# Patient Record
Sex: Female | Born: 1965 | Race: White | Hispanic: No | Marital: Married | Smoking: Never smoker
Health system: Southern US, Community
[De-identification: ages and names within clinical notes are randomized; demographics above are authoritative.]

## PROBLEM LIST (undated history)

## (undated) DIAGNOSIS — N159 Renal tubulo-interstitial disease, unspecified: Secondary | ICD-10-CM

## (undated) DIAGNOSIS — L719 Rosacea, unspecified: Secondary | ICD-10-CM

## (undated) DIAGNOSIS — R42 Dizziness and giddiness: Secondary | ICD-10-CM

## (undated) DIAGNOSIS — R51 Headache: Secondary | ICD-10-CM

## (undated) DIAGNOSIS — N390 Urinary tract infection, site not specified: Secondary | ICD-10-CM

## (undated) DIAGNOSIS — M722 Plantar fascial fibromatosis: Secondary | ICD-10-CM

## (undated) DIAGNOSIS — H9319 Tinnitus, unspecified ear: Secondary | ICD-10-CM

## (undated) DIAGNOSIS — G930 Cerebral cysts: Secondary | ICD-10-CM

## (undated) DIAGNOSIS — M419 Scoliosis, unspecified: Secondary | ICD-10-CM

## (undated) DIAGNOSIS — O24419 Gestational diabetes mellitus in pregnancy, unspecified control: Secondary | ICD-10-CM

## (undated) DIAGNOSIS — G4733 Obstructive sleep apnea (adult) (pediatric): Secondary | ICD-10-CM

## (undated) DIAGNOSIS — J309 Allergic rhinitis, unspecified: Secondary | ICD-10-CM

## (undated) DIAGNOSIS — J189 Pneumonia, unspecified organism: Secondary | ICD-10-CM

## (undated) DIAGNOSIS — Z9889 Other specified postprocedural states: Secondary | ICD-10-CM

## (undated) DIAGNOSIS — I959 Hypotension, unspecified: Secondary | ICD-10-CM

## (undated) DIAGNOSIS — M1712 Unilateral primary osteoarthritis, left knee: Secondary | ICD-10-CM

## (undated) DIAGNOSIS — R112 Nausea with vomiting, unspecified: Secondary | ICD-10-CM

## (undated) DIAGNOSIS — K4090 Unilateral inguinal hernia, without obstruction or gangrene, not specified as recurrent: Secondary | ICD-10-CM

## (undated) DIAGNOSIS — J342 Deviated nasal septum: Secondary | ICD-10-CM

## (undated) HISTORY — DX: Unilateral primary osteoarthritis, left knee: M17.12

## (undated) HISTORY — DX: Allergic rhinitis, unspecified: J30.9

## (undated) HISTORY — DX: Obstructive sleep apnea (adult) (pediatric): G47.33

## (undated) HISTORY — PX: TONSILLECTOMY AND ADENOIDECTOMY: SUR1326

## (undated) HISTORY — DX: Headache: R51

## (undated) HISTORY — PX: HERNIA REPAIR: SHX51

## (undated) HISTORY — PX: OTHER SURGICAL HISTORY: SHX169

## (undated) HISTORY — PX: TUBAL LIGATION: SHX77

## (undated) HISTORY — DX: Rosacea, unspecified: L71.9

---

## 2005-04-01 ENCOUNTER — Ambulatory Visit: Payer: Self-pay | Admitting: Family Medicine

## 2005-04-15 ENCOUNTER — Ambulatory Visit: Payer: Self-pay | Admitting: Family Medicine

## 2005-04-24 ENCOUNTER — Ambulatory Visit: Payer: Self-pay | Admitting: Family Medicine

## 2005-12-31 DIAGNOSIS — IMO0002 Reserved for concepts with insufficient information to code with codable children: Secondary | ICD-10-CM | POA: Insufficient documentation

## 2005-12-31 DIAGNOSIS — M4150 Other secondary scoliosis, site unspecified: Secondary | ICD-10-CM | POA: Insufficient documentation

## 2006-02-17 ENCOUNTER — Ambulatory Visit: Payer: Self-pay | Admitting: Family Medicine

## 2006-02-17 ENCOUNTER — Encounter: Payer: Self-pay | Admitting: Family Medicine

## 2006-02-17 ENCOUNTER — Other Ambulatory Visit: Admission: RE | Admit: 2006-02-17 | Discharge: 2006-02-17 | Payer: Self-pay | Admitting: Family Medicine

## 2006-02-17 LAB — CONVERTED CEMR LAB: Pap Smear: NEGATIVE

## 2006-02-19 ENCOUNTER — Encounter: Payer: Self-pay | Admitting: Family Medicine

## 2006-02-20 ENCOUNTER — Telehealth (INDEPENDENT_AMBULATORY_CARE_PROVIDER_SITE_OTHER): Payer: Self-pay | Admitting: *Deleted

## 2006-02-20 ENCOUNTER — Encounter: Payer: Self-pay | Admitting: Family Medicine

## 2006-02-24 LAB — CONVERTED CEMR LAB
ALT: 17 units/L (ref 0–35)
AST: 14 units/L (ref 0–37)
Albumin: 4.4 g/dL (ref 3.5–5.2)
Alkaline Phosphatase: 93 units/L (ref 39–117)
BUN: 10 mg/dL (ref 6–23)
CO2: 25 meq/L (ref 19–32)
Calcium: 9.3 mg/dL (ref 8.4–10.5)
Chloride: 106 meq/L (ref 96–112)
Cholesterol: 164 mg/dL (ref 0–200)
Creatinine, Ser: 0.64 mg/dL (ref 0.40–1.20)
Glucose, Bld: 99 mg/dL (ref 70–99)
HCT: 43.8 % — ABNORMAL HIGH (ref 34.4–43.3)
HDL: 36 mg/dL — ABNORMAL LOW (ref >39)
Hemoglobin: 14.5 g/dL (ref 11.7–14.8)
INR: 1 (ref 0.0–1.5)
LDL Cholesterol: 116 mg/dL — ABNORMAL HIGH (ref 0–99)
MCHC: 33.1 g/dL (ref 33.1–35.4)
MCV: 89.9 fL (ref 78.8–100.0)
Platelets: 271 10*3/uL (ref 152–374)
Potassium: 4.6 meq/L (ref 3.5–5.3)
Prothrombin Time: 13.6 s (ref 11.6–15.2)
RBC: 4.87 M/uL (ref 3.79–4.96)
RDW: 12.7 % (ref 11.5–15.3)
Sed Rate: 4 mm/hr (ref 0–22)
Sodium: 141 meq/L (ref 135–145)
TSH: 1.475 microintl units/mL (ref 0.350–5.50)
Total Bilirubin: 0.5 mg/dL (ref 0.3–1.2)
Total CHOL/HDL Ratio: 4.6
Total Protein: 7.4 g/dL (ref 6.0–8.3)
Triglycerides: 62 mg/dL (ref <150)
VLDL: 12 mg/dL (ref 0–40)
WBC: 5 10*3/uL (ref 3.7–10.0)

## 2006-05-21 ENCOUNTER — Ambulatory Visit: Payer: Self-pay | Admitting: Family Medicine

## 2006-05-21 DIAGNOSIS — M4310 Spondylolisthesis, site unspecified: Secondary | ICD-10-CM | POA: Insufficient documentation

## 2006-05-30 ENCOUNTER — Encounter: Payer: Self-pay | Admitting: Family Medicine

## 2006-06-04 ENCOUNTER — Ambulatory Visit: Payer: Self-pay | Admitting: Family Medicine

## 2006-07-03 ENCOUNTER — Ambulatory Visit: Payer: Self-pay | Admitting: Family Medicine

## 2006-12-12 ENCOUNTER — Ambulatory Visit: Payer: Self-pay | Admitting: Family Medicine

## 2008-03-14 ENCOUNTER — Ambulatory Visit: Payer: Self-pay | Admitting: Family Medicine

## 2008-08-03 ENCOUNTER — Ambulatory Visit: Payer: Self-pay | Admitting: Family Medicine

## 2008-08-03 DIAGNOSIS — J309 Allergic rhinitis, unspecified: Secondary | ICD-10-CM | POA: Insufficient documentation

## 2008-08-03 HISTORY — DX: Allergic rhinitis, unspecified: J30.9

## 2009-03-07 ENCOUNTER — Ambulatory Visit: Payer: Self-pay | Admitting: Family Medicine

## 2009-03-07 LAB — CONVERTED CEMR LAB
Glucose, Urine, Semiquant: NEGATIVE
Protein, U semiquant: 100
Specific Gravity, Urine: 1.02
Urobilinogen, UA: 0.2
pH: 7

## 2009-03-10 ENCOUNTER — Ambulatory Visit: Payer: Self-pay | Admitting: Family Medicine

## 2009-03-10 ENCOUNTER — Telehealth: Payer: Self-pay | Admitting: Family Medicine

## 2009-03-10 LAB — CONVERTED CEMR LAB
Glucose, Urine, Semiquant: NEGATIVE
Nitrite: POSITIVE
Protein, U semiquant: NEGATIVE
Specific Gravity, Urine: 1.01
Urobilinogen, UA: 0.2
pH: 6

## 2009-03-11 ENCOUNTER — Encounter: Payer: Self-pay | Admitting: Family Medicine

## 2009-04-25 ENCOUNTER — Encounter: Payer: Self-pay | Admitting: Family Medicine

## 2009-10-27 ENCOUNTER — Ambulatory Visit: Payer: Self-pay | Admitting: Family Medicine

## 2009-10-27 DIAGNOSIS — M75100 Unspecified rotator cuff tear or rupture of unspecified shoulder, not specified as traumatic: Secondary | ICD-10-CM | POA: Insufficient documentation

## 2009-10-27 DIAGNOSIS — IMO0002 Reserved for concepts with insufficient information to code with codable children: Secondary | ICD-10-CM | POA: Insufficient documentation

## 2009-11-13 ENCOUNTER — Ambulatory Visit: Payer: Self-pay | Admitting: Family Medicine

## 2009-11-13 ENCOUNTER — Encounter: Admission: RE | Admit: 2009-11-13 | Discharge: 2009-11-13 | Payer: Self-pay | Admitting: Family Medicine

## 2010-02-20 ENCOUNTER — Ambulatory Visit: Payer: Self-pay | Admitting: Family Medicine

## 2010-02-20 DIAGNOSIS — N390 Urinary tract infection, site not specified: Secondary | ICD-10-CM | POA: Insufficient documentation

## 2010-02-20 LAB — CONVERTED CEMR LAB
Nitrite: POSITIVE
Urobilinogen, UA: 0.2
pH: 7

## 2010-02-23 ENCOUNTER — Telehealth: Payer: Self-pay | Admitting: Family Medicine

## 2010-04-24 NOTE — Assessment & Plan Note (Signed)
Summary: UTI  Nurse Visit   Vital Signs:  Patient profile:   45 year old female Temp:     98.2 degrees F  Vitals Entered By: Payton Spark CMA (February 20, 2010 3:07 PM)  Primary Care Courtney Fenlon:  Nani Gasser MD  CC:  C/o burning w/ urination and urgency x 1 day.  History of Present Illness: C/o burning w/ urination and urgency x 1 day.     Impression & Recommendations:  Problem # 1:  UTI (ICD-599.0)  Orders: UA Dipstick w/o Micro (automated)  (81003)  Her updated medication list for this problem includes:    Bactrim Ds 800-160 Mg Tabs (Sulfamethoxazole-trimethoprim) .Marland Kitchen... Take 1 tablet by mouth two times a day for 3 days  Encouraged to push clear liquids, get enough rest, and take acetaminophen as needed. To be seen in 10 days if no improvement, sooner if worse.  Complete Medication List: 1)  Hydrocodone-acetaminophen 5-500 Mg Tabs (Hydrocodone-acetaminophen) .... One to two  by mouth at bedtime as needed 2)  Meloxicam 15 Mg Tabs (Meloxicam) .... Take 1 tablet by mouth once a day 3)  Tramadol Hcl 50 Mg Tabs (Tramadol hcl) .... Take 1 tablet by mouth three times a day as needed for pain. 4)  Bactrim Ds 800-160 Mg Tabs (Sulfamethoxazole-trimethoprim) .... Take 1 tablet by mouth two times a day for 3 days   Patient Instructions: 1)  push clear liquids, get enough rest, and take acetaminophen as needed. 2)  Complete the antibiotic. Call if not better in 5 days.     CC: C/o burning w/ urination and urgency x 1 day   Allergies: 1)  ! * Egg Whites Laboratory Results   Urine Tests    Routine Urinalysis   Color: yellow Appearance: Clear Glucose: negative   (Normal Range: Negative) Bilirubin: negative   (Normal Range: Negative) Ketone: trace (5)   (Normal Range: Negative) Spec. Gravity: 1.025   (Normal Range: 1.003-1.035) Blood: large   (Normal Range: Negative) pH: 7.0   (Normal Range: 5.0-8.0) Protein: 100   (Normal Range: Negative) Urobilinogen: 0.2    (Normal Range: 0-1) Nitrite: positive   (Normal Range: Negative) Leukocyte Esterace: moderate   (Normal Range: Negative)       Orders Added: 1)  UA Dipstick w/o Micro (automated)  [81003] 2)  Est. Patient Level I [27253] Prescriptions: BACTRIM DS 800-160 MG TABS (SULFAMETHOXAZOLE-TRIMETHOPRIM) Take 1 tablet by mouth two times a day for 3 days  #6 x 0   Entered and Authorized by:   Nani Gasser MD   Signed by:   Nani Gasser MD on 02/20/2010   Method used:   [facility]        [facility] [phone][facility] (retail)       [address] [place], [place]  [address]       Ph: [phone] or [phone]       Fax: [phone]   RxID:   [id number]

## 2010-04-24 NOTE — Assessment & Plan Note (Signed)
Summary: SHOULDER PAIN   Vital Signs:  Patient profile:   45 year old female Height:      66.25 inches Weight:      187 pounds Pulse rate:   68 / minute BP sitting:   117 / 81  (left arm) Cuff size:   regular  Vitals Entered By: Avon Gully CMA, (AAMA) (October 27, 2009 10:00 AM) CC: rt shoulder pain since 2 am Pain Assessment Patient in pain? yes     Location: rt shoulder Intensity: 10 Type: sharp   Primary Care Provider:  Nani Gasser MD  CC:  rt shoulder pain since 2 am.  History of Present Illness: rt shoulder pain since 2 am.  No alleviating sxs.  About 5 year ago had bursitis in her shoulder.  Pain is now radiating down to her outer shoulder.  Took some Advil for her toe but nothing today.  No worsening position. No trauma or heavy lifting or inciting factors.  No fever.    Current Medications (verified): 1)  None  Allergies (verified): 1)  ! * Egg Whites  Comments:  Nurse/Medical Assistant: The patient's medications and allergies were reviewed with the patient and were updated in the Medication and Allergy Lists. Avon Gully CMA, Duncan Dull) (October 27, 2009 10:03 AM)  Physical Exam  General:  Well-developed,well-nourished,in no acute distress; alert,appropriate and cooperative throughout examination Msk:  Right shoulder with NROM, exceptsome limitation with internal roation and crossover.  These are especially painful.Glori Bickers if 5/5. TEnder ofer the Tender below the acromium  and the upper deltoi.  Tender below the acromium anteriorly. Mild swelling present. NO erythema.    Impression & Recommendations:  Problem # 1:  SUBACROMIAL BURSITIS, RIGHT (ICD-726.19)  Dsicussed treament option. Increase Advil to 600mg  three times a day with food. Ecie the area. Can start exercises on Monday. H.O given.   Injection given and pt tolerated very well.  F/U inf not better in 2 weeks.   Orders: Joint Aspirate / Injection, Large (20610)  Complete  Medication List: 1)  Hydrocodone-acetaminophen 5-500 Mg Tabs (Hydrocodone-acetaminophen) .... One by mouth at bedtime as needed  Patient Instructions: 1)  Advil 3 tabs by mouth three times a day for inflammation and pain.  2)  Also given some hydrocodone for bedtime 3)  Ice the area for 20 min 3-4 x a day.  Can start the exercises on Monday. See the handout  Procedure Note  Injections: Duration of symptoms: 8-12 hrs. Indication: acute pain  Procedure # 1: joint injection    Region: posterior     Location: Right hsoulder.     Technique: 20 g needle    Medication: 40 mg depomedrol    Anesthesia: 1% lidocaine w/o epinephrine    Comment: lidocaine lot num [id number] exp 11/12 depo-medrol 40 mg/ml lot num [id number] exp 03/2010  Cleaned and prepped with: alcohol and betadine Wound dressing: bandaid Additional Instructions: Pt tolerated well. No complications.    Prescriptions: HYDROCODONE-ACETAMINOPHEN 5-500 MG TABS (HYDROCODONE-ACETAMINOPHEN) one by mouth at bedtime as needed  #30 x 0   Entered and Authorized by:   Nani Gasser MD   Signed by:   Nani Gasser MD on 10/27/2009   Method used:   Printed then faxed [facility] ...       [facility]  [address] [phone]* (retail)       [address] [place], [place]  [address]       Ph: [phone]  or [phone]       Fax: [phone]   RxID:   [id number]    Orders Added: 1)  Est. Patient Level III [01027] 2)  Joint Aspirate / Injection, Large [20610]

## 2010-04-24 NOTE — Assessment & Plan Note (Signed)
Summary: f/u on shoulder pain   Vital Signs:  Patient profile:   45 year old female Height:      66.25 inches Weight:      189 pounds BMI:     30.39 O2 Sat:      98 % on Room air Pulse rate:   99 / minute BP sitting:   127 / 74  (left arm) Cuff size:   large  Vitals Entered By: Payton Spark CMA (November 13, 2009 3:21 PM)  O2 Flow:  Room air CC: F/U shoulder pain. Is getting better but still having pain mostly at night.   Primary Care Provider:  Nani Gasser MD  CC:  F/U shoulder pain. Is getting better but still having pain mostly at night..  History of Present Illness: F/U shoulder pain. Is getting better but still having pain mostly at night. almost 40% better. Now has just 2 spots that throb. One is over the outer deltoid and on the uper shouder.  Still can't get relief at night.  She hs been weaning down on her IBU. Does have good ROM now.  Not sure if the injection helpd or night.  Still icing occ.   Current Medications (verified): 1)  Hydrocodone-Acetaminophen 5-500 Mg Tabs (Hydrocodone-Acetaminophen) .... One By Mouth At Bedtime As Needed  Allergies (verified): 1)  ! * Egg Whites  Physical Exam  General:  Well-developed,well-nourished,in no acute distress; alert,appropriate and cooperative throughout examination Msk:  Right shoulder with NROM. Tender over teh outer deltoid and over the upper trapezius. No jont tenderness. Strenght 5/5. Got a cramp in the deltoid during exam.    Impression & Recommendations:  Problem # 1:  SUBACROMIAL BURSITIS, RIGHT (ICD-726.19) Her exam today is less consistant with bursitis.  Stop the Ibuprofen and can start the meloxicam once a day and use the tramadol up to 3 x a day as a rescue medication. Will get an xray, though will likely be negative.  Will call with results.  Contnue the exercises for now since getting some better (about 40%).  will get an xray today. If neg then refer to ortho for further managment. Will change to  longet acting antiinflammotory and tramadol for acute pain relief.   Orders: T-DG Shoulder*R* (54098)  Complete Medication List: 1)  Hydrocodone-acetaminophen 5-500 Mg Tabs (Hydrocodone-acetaminophen) .... One to two  by mouth at bedtime as needed 2)  Meloxicam 15 Mg Tabs (Meloxicam) .... Take 1 tablet by mouth once a day 3)  Tramadol Hcl 50 Mg Tabs (Tramadol hcl) .... Take 1 tablet by mouth three times a day as needed for pain.  Patient Instructions: 1)  We will call you with your xray results 2)  Stop the Ibuprofen and can start the meloxicam once a day and use the tramadol up to 3 x a day as a rescue medication.  Prescriptions: HYDROCODONE-ACETAMINOPHEN 5-500 MG TABS (HYDROCODONE-ACETAMINOPHEN) one to two  by mouth at bedtime as needed  #60 x 0   Entered and Authorized by:   Nani Gasser MD   Signed by:   Nani Gasser MD on 11/13/2009   Method used:   Printed then faxed to ...       [facility]  [place] [phone]* (retail)       [address] [place], [place]  [address]       Ph: [phone] or [phone]       Fax: [phone]   RxID:   [id number] TRAMADOL HCL  50 MG TABS (TRAMADOL HCL) Take 1 tablet by mouth three times a day as needed for pain.  #45 x 0   Entered and Authorized by:   Nani Gasser MD   Signed by:   Nani Gasser MD on 11/13/2009   Method used:   Electronically to        [facility] [phone]* (retail)       [address] [place], [place]  [address]       Ph: [phone] or [phone]       Fax: [phone]   RxID:   [id number] MELOXICAM 15 MG TABS (MELOXICAM) Take 1 tablet by mouth once a day  #30 x 1   Entered and Authorized by:   Nani Gasser MD   Signed by:   Nani Gasser MD on 11/13/2009   Method used:   Electronically to        [facility] [phone]* (retail)       [address] [place], [place]  [address]       Ph: [phone] or [phone]       Fax: [phone]   RxID:   [id number]

## 2010-04-24 NOTE — Letter (Signed)
Summary: Gae Dry MD  Gae Dry MD   Imported By: Lanelle Bal 06/22/2009 10:36:02  _____________________________________________________________________  External Attachment:    Type:   Image     Comment:   External Document

## 2010-04-24 NOTE — Progress Notes (Signed)
Summary: Rash from antibiotic  Phone Note Call from Patient Call back at Work Phone [phone]   Caller: Patient Call For: Nani Gasser MD Summary of Call: Pt calls and started the Bactrim on Tuesday night and noticed red welp rash that really itches on both hands and back itching. Has one more pill left and didn't know if needed a different med or continue to take Benedryl which help some but not alot. Initial call taken by: Kathlene November LPN,  February 23, 2010 9:53 AM  Follow-up for Phone Call        If only has one left then stop it. I will add to her allergy list. Yes, take 2 benadryl and repeat in 6 hours if needed.  Follow-up by: Nani Gasser MD,  February 23, 2010 11:35 AM  Additional Follow-up for Phone Call Additional follow up Details #1::        Pt notified of MD instructions. [id number] Additional Follow-up by: Kathlene November LPN,  February 23, 2010 11:37 AM   New Allergies: ! BACTRIM (SULFAMETHOXAZOLE-TRIMETHOPRIM) New Allergies: ! BACTRIM (SULFAMETHOXAZOLE-TRIMETHOPRIM)

## 2010-09-12 ENCOUNTER — Encounter: Payer: Self-pay | Admitting: Family Medicine

## 2010-09-13 ENCOUNTER — Other Ambulatory Visit (HOSPITAL_COMMUNITY)
Admission: RE | Admit: 2010-09-13 | Discharge: 2010-09-13 | Disposition: A | Discharge status: Home / Self Care | Payer: BC Managed Care – PPO | Source: Ambulatory Visit | Attending: Family Medicine | Admitting: Family Medicine

## 2010-09-13 ENCOUNTER — Encounter: Payer: Self-pay | Admitting: Family Medicine

## 2010-09-13 ENCOUNTER — Ambulatory Visit (INDEPENDENT_AMBULATORY_CARE_PROVIDER_SITE_OTHER): Payer: BC Managed Care – PPO | Admitting: Family Medicine

## 2010-09-13 DIAGNOSIS — R0683 Snoring: Secondary | ICD-10-CM

## 2010-09-13 DIAGNOSIS — L719 Rosacea, unspecified: Secondary | ICD-10-CM

## 2010-09-13 DIAGNOSIS — Z124 Encounter for screening for malignant neoplasm of cervix: Secondary | ICD-10-CM

## 2010-09-13 DIAGNOSIS — Z0000 Encounter for general adult medical examination without abnormal findings: Secondary | ICD-10-CM

## 2010-09-13 DIAGNOSIS — R0609 Other forms of dyspnea: Secondary | ICD-10-CM

## 2010-09-13 DIAGNOSIS — Z1159 Encounter for screening for other viral diseases: Secondary | ICD-10-CM | POA: Insufficient documentation

## 2010-09-13 DIAGNOSIS — R0989 Other specified symptoms and signs involving the circulatory and respiratory systems: Secondary | ICD-10-CM

## 2010-09-13 DIAGNOSIS — Z01419 Encounter for gynecological examination (general) (routine) without abnormal findings: Secondary | ICD-10-CM | POA: Insufficient documentation

## 2010-09-13 HISTORY — DX: Rosacea, unspecified: L71.9

## 2010-09-13 MED ORDER — FLUTICASONE FUROATE 27.5 MCG/SPRAY NA SUSP: 2.0000 | Freq: Every day | NASAL | Status: DC

## 2010-09-13 NOTE — Progress Notes (Signed)
  Subjective:     Briana Parks is a 45 y.o. female and is here for a comprehensive physical exam. The patient reports problems - husband says she is snoring and thinks she gasps in her sleep somtimes.  Snoring is most nights. Oc wakes up with a HA in the AM.  Fatigued during the day. Has never been tested for sleep apnea.  No recent weight changes.  Always feels like she has nasal congestion.    History   Social History  . Marital Status: Married    Spouse Name: N/A    Number of Children: N/A  . Years of Education: N/A   Occupational History  . Not on file.   Social History Main Topics  . Smoking status: Never Smoker   . Smokeless tobacco: Not on file  . Alcohol Use: Yes     rare  . Drug Use: No  . Sexually Active:      cake decorator, 2 yrs college, married, 2 children, 4 sodas daily, no regular exercise   Other Topics Concern  . Not on file   Social History Narrative  . No narrative on file   Health Maintenance  Topic Date Due  . Pap Smear  05/18/1985  . Tetanus/tdap  05/18/1986    The following portions of the patient's history were reviewed and updated as appropriate: allergies, current medications, past family history, past medical history, past social history and past surgical history.  Review of Systems A comprehensive review of systems was negative.   Objective:    BP 127/81  Pulse 85  Ht 5\' 6"  (1.676 m)  Wt 189 lb (85.73 kg)  BMI 30.51 kg/m2 General appearance: alert, cooperative and appears stated age. Overwight.  Head: Normocephalic, without obvious abnormality, atraumatic Eyes: conjunctiva clear, PEERLA, EOMi Ears: normal TM's and external ear canals both ears Nose: Nares normal. Septum midline. Mucosa normal. No drainage or sinus tenderness.Turbinates are only mildy swollen.  Throat: lips, mucosa, and tongue normal; teeth and gums normal Neck: no adenopathy, no carotid bruit, supple, symmetrical, trachea midline and thyroid not enlarged,  symmetric, no tenderness/mass/nodules Back: symmetric, no curvature. ROM normal. No CVA tenderness. Lungs: clear to auscultation bilaterally Breasts: normal appearance, no masses or tenderness Heart: regular rate and rhythm, S1, S2 normal, no murmur, click, rub or gallop Abdomen: soft, non-tender; bowel sounds normal; no masses,  no organomegaly Pelvic: cervix normal in appearance, external genitalia normal, no adnexal masses or tenderness, no cervical motion tenderness, rectovaginal septum normal, uterus normal size, shape, and consistency and vagina normal without discharge Extremities: extremities normal, atraumatic, no cyanosis or edema Pulses: 2+ and symmetric Skin: Skin color, texture, turgor normal. No rashes or lesions. Rosacea on her facial cheeks.  Lymph nodes: Cervical, supraclavicular, and axillary nodes normal. Neurologic: Grossly normal    Assessment:    Healthy female exam.    Plan:     See After Visit Summary for Counseling Recommendations  1. Encourage healthy diet and exercise.  2. Vaccines up to date. Allergic to tetanus.  3. Screening labs 4. Weill schedule her for a sleep study test. since she has snoring, am HA, and daytime fatigue.  5. Will call with pap results.  6. Also will try a nasal steroid for 2 weeks for her chronic nasal congesiton and see if hthat helps her sxs.

## 2010-09-15 LAB — COMPLETE METABOLIC PANEL WITH GFR
Albumin: 4.4 g/dL (ref 3.5–5.2)
GFR, Est African American: >60 mL/min (ref >60)
GFR, Est Non African American: >60 mL/min (ref >60)
Glucose, Bld: 98 mg/dL (ref 70–99)
Sodium: 137 mEq/L (ref 135–145)
Total Protein: 6.8 g/dL (ref 6.0–8.3)

## 2010-09-16 ENCOUNTER — Telehealth: Payer: Self-pay | Admitting: Family Medicine

## 2010-09-16 NOTE — Telephone Encounter (Signed)
Call pt: CMP is nll.  Bad chol is up more than last year,  and good chol is low. REcommend regular exercise and how fat diet.

## 2010-09-17 NOTE — Telephone Encounter (Signed)
Advised pt of results and rec. 

## 2010-09-21 ENCOUNTER — Telehealth: Payer: Self-pay | Admitting: Family Medicine

## 2010-09-21 NOTE — Telephone Encounter (Signed)
Pls let pt know that her pap smear came back normal.  Repeat in 1-2 yrs.   

## 2010-09-21 NOTE — Telephone Encounter (Signed)
LMOM informing Pt  

## 2010-10-10 ENCOUNTER — Telehealth: Payer: Self-pay | Admitting: Family Medicine

## 2010-10-10 NOTE — Telephone Encounter (Signed)
Call pt: sleep study did show she has some sleep apnea. Recommend not driving when feels sleepy. i would like her to make an appt to discuss results in detail.

## 2010-10-10 NOTE — Telephone Encounter (Signed)
Pt notified of her recent sleep study results.  Told pt not to drive when feels sleepy, and a appt was made for pt for 10-24-10 with the provider to discuss in more detail. Jarvis Newcomer, LPN Domingo Dimes

## 2010-10-11 ENCOUNTER — Telehealth: Payer: Self-pay | Admitting: Family Medicine

## 2010-10-11 NOTE — Telephone Encounter (Signed)
Closed

## 2010-10-24 ENCOUNTER — Ambulatory Visit (INDEPENDENT_AMBULATORY_CARE_PROVIDER_SITE_OTHER): Payer: BC Managed Care – PPO | Admitting: Family Medicine

## 2010-10-24 ENCOUNTER — Encounter: Payer: Self-pay | Admitting: Family Medicine

## 2010-10-24 DIAGNOSIS — G4733 Obstructive sleep apnea (adult) (pediatric): Secondary | ICD-10-CM

## 2010-10-24 HISTORY — DX: Obstructive sleep apnea (adult) (pediatric): G47.33

## 2010-10-24 NOTE — Progress Notes (Signed)
  Subjective:    Patient ID: Briana Parks, female    DOB: 1965/09/18, 45 y.o.   MRN: [id number]  HPI She is here today to discuss the results of her sleep study that she had done at [facility]. She was diagnosed with moderate overall obstructive sleep apnea. AHI of 19.8. With desaturations to 79%. Severe obstructive sleep apnea and REM sleep. She had frequent severe snoring and moderate leg charts that were associated with respiratory events. She had difficulty initiating and maintaining sleep. No slow wave sleep. She does have mild obesity which certainly is a risk factor. She also had moderate daytime sleepiness based on her Epworth sleepiness scale of 11. She was unable to do the second part of the sleep study because of time constraints. I discussed these results with her and her husband today. I did explain to her that it's not safe for her to drive if she becomes sleepy. Also that she should not operate heavy machinery. vo  Review of Systems     Objective:   Physical Exam        Assessment & Plan:  Moderate OSA. I discussed these results with her and her husband today. I did explain to her that it's not safe for her to drive if she becomes sleepy. Also that she should not operate heavy machinery. We will schedule her for the second part of split sleep study. I did discuss with her that we certainly could consult Dr. Pearlie Oyster as well if she is interested. At this time she would like to go ahead and do the second part of the study and get set up with CPAP. I explained how this works. Then we will followup in 6-8 weeks after she starts using her CPAP to see how well she is doing. If we run into any barriers at that time then we will consider referral to Dr. Pearlie Oyster for further evaluation and treatment.

## 2010-10-30 ENCOUNTER — Encounter: Payer: Self-pay | Admitting: Family Medicine

## 2010-11-12 ENCOUNTER — Telehealth: Payer: Self-pay | Admitting: Family Medicine

## 2010-11-12 DIAGNOSIS — G4733 Obstructive sleep apnea (adult) (pediatric): Secondary | ICD-10-CM

## 2010-11-12 MED ORDER — AMBULATORY NON FORMULARY MEDICATION: Status: AC

## 2010-11-12 NOTE — Telephone Encounter (Signed)
Call pt: 2nd part of sleep study showed need 7 cm water pressure on the CPAP.  I will put in rx for home health. Lets use [facility] or [facility].

## 2010-11-13 NOTE — Telephone Encounter (Signed)
Left message on vm

## 2011-01-02 ENCOUNTER — Telehealth: Payer: Self-pay | Admitting: Family Medicine

## 2011-01-02 NOTE — Telephone Encounter (Signed)
Victorino Dike with [facility] called to verify the order that was sent to them regarding this patient. Plan:  Order verified. Jarvis Newcomer, LPN Domingo Dimes

## 2011-01-10 ENCOUNTER — Encounter: Payer: Self-pay | Admitting: Family Medicine

## 2011-02-12 ENCOUNTER — Encounter: Payer: Self-pay | Admitting: Family Medicine

## 2011-09-20 ENCOUNTER — Encounter: Payer: Self-pay | Admitting: *Deleted

## 2011-09-24 ENCOUNTER — Encounter: Payer: Self-pay | Admitting: Family Medicine

## 2011-09-24 ENCOUNTER — Ambulatory Visit (INDEPENDENT_AMBULATORY_CARE_PROVIDER_SITE_OTHER): Payer: BC Managed Care – PPO | Admitting: Family Medicine

## 2011-09-24 ENCOUNTER — Ambulatory Visit (INDEPENDENT_AMBULATORY_CARE_PROVIDER_SITE_OTHER): Payer: BC Managed Care – PPO

## 2011-09-24 VITALS — BP 132/84 | HR 67 | Ht 66.0 in | Wt 193.0 lb

## 2011-09-24 DIAGNOSIS — M79673 Pain in unspecified foot: Secondary | ICD-10-CM

## 2011-09-24 DIAGNOSIS — N920 Excessive and frequent menstruation with regular cycle: Secondary | ICD-10-CM

## 2011-09-24 DIAGNOSIS — M25519 Pain in unspecified shoulder: Secondary | ICD-10-CM

## 2011-09-24 DIAGNOSIS — M2550 Pain in unspecified joint: Secondary | ICD-10-CM

## 2011-09-24 DIAGNOSIS — N926 Irregular menstruation, unspecified: Secondary | ICD-10-CM

## 2011-09-24 DIAGNOSIS — M79609 Pain in unspecified limb: Secondary | ICD-10-CM

## 2011-09-24 DIAGNOSIS — M722 Plantar fascial fibromatosis: Secondary | ICD-10-CM

## 2011-09-24 MED ORDER — MELOXICAM 15 MG PO TABS: 15.0000 mg | ORAL_TABLET | Freq: Every day | ORAL | Status: DC | PRN

## 2011-09-24 MED ORDER — METAXALONE 800 MG PO TABS: 800.0000 mg | ORAL_TABLET | Freq: Every evening | ORAL | Status: DC | PRN

## 2011-09-24 NOTE — Patient Instructions (Addendum)
Plantar Fasciitis Plantar fasciitis is a common condition that causes foot pain. It is soreness (inflammation) of the band of tough fibrous tissue on the bottom of the foot that runs from the heel bone (calcaneus) to the ball of the foot. The cause of this soreness may be from excessive standing, poor fitting shoes, running on hard surfaces, being overweight, having an abnormal walk, or overuse (this is common in runners) of the painful foot or feet. It is also common in aerobic exercise dancers and ballet dancers. SYMPTOMS   Most people with plantar fasciitis complain of:  Severe pain in the morning on the bottom of their foot especially when taking the first steps out of bed. This pain recedes after a few minutes of walking.   Severe pain is experienced also during walking following a long period of inactivity.   Pain is worse when walking barefoot or up stairs  DIAGNOSIS    Your caregiver will diagnose this condition by examining and feeling your foot.   Special tests such as X-rays of your foot, are usually not needed.  PREVENTION    Consult a sports medicine professional before beginning a new exercise program.   Walking programs offer a good workout. With walking there is a lower chance of overuse injuries common to runners. There is less impact and less jarring of the joints.   Begin all new exercise programs slowly. If problems or pain develop, decrease the amount of time or distance until you are at a comfortable level.   Wear good shoes and replace them regularly.   Stretch your foot and the heel cords at the back of the ankle (Achilles tendon) both before and after exercise.   Run or exercise on even surfaces that are not hard. For example, asphalt is better than pavement.   Do not run barefoot on hard surfaces.   If using a treadmill, vary the incline.   Do not continue to workout if you have foot or joint problems. Seek professional help if they do not improve.  HOME  CARE INSTRUCTIONS    Avoid activities that cause you pain until you recover.   Use ice or cold packs on the problem or painful areas after working out.   Only take over-the-counter or prescription medicines for pain, discomfort, or fever as directed by your caregiver.   Soft shoe inserts or athletic shoes with air or gel sole cushions may be helpful.   If problems continue or become more severe, consult a sports medicine caregiver or your own health care provider. Cortisone is a potent anti-inflammatory medication that may be injected into the painful area. You can discuss this treatment with your caregiver.  MAKE SURE YOU:    Understand these instructions.   Will watch your condition.   Will get help right away if you are not doing well or get worse.  Document Released: 12/04/2000 Document Revised: 02/28/2011 Document Reviewed: 02/03/2008 [facility] Patient Information 2012 [place], [place].Bursitis Bursitis is a swelling and soreness (inflammation) of a fluid-filled sac (bursa) that overlies and protects a joint. It can be caused by injury, overuse of the joint, arthritis or infection. The joints most likely to be affected are the elbows, shoulders, hips and knees. HOME CARE INSTRUCTIONS    Apply ice to the affected area for 15 to 20 minutes each hour while awake for 2 days. Put the ice in a plastic bag and place a towel between the bag of ice and your skin.   Rest the  injured joint as much as possible, but continue to put the joint through a full range of motion, 4 times per day. (The shoulder joint especially becomes rapidly "frozen" if not used.) When the pain lessens, begin normal slow movements and usual activities.   Only take over-the-counter or prescription medicines for pain, discomfort or fever as directed by your caregiver.   Your caregiver may recommend draining the bursa and injecting medicine into the bursa. This may help the healing process.   Follow all instructions for  follow-up with your caregiver. This includes any orthopedic referrals, physical therapy and rehabilitation. Any delay in obtaining necessary care could result in a delay or failure of the bursitis to heal and chronic pain.  SEEK IMMEDIATE MEDICAL CARE IF:    Your pain increases even during treatment.   You develop an oral temperature above 102 F (38.9 C) and have heat and inflammation over the involved bursa.  MAKE SURE YOU:    Understand these instructions.   Will watch your condition.   Will get help right away if you are not doing well or get worse.  Document Released: 03/08/2000 Document Revised: 02/28/2011 Document Reviewed: 02/10/2009 [facility] Patient Information 2012 [place], [place].

## 2011-09-24 NOTE — Progress Notes (Signed)
  Subjective:    Patient ID: Briana Parks, female    DOB: 03/04/1966, 46 y.o.   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 Was very sore afterwards. Was having bilat foot pain. Went to foot store and bought some danskos and new tennis shoes but it was gradually getting worse.  Was the longer on it the worse and has tried icing it and the flexion/extension.  Used some biofreeze.  Says maybe a little better. But occ feels like a needle stabbing her in the front of the heel pad.  Sometimes tender to touch. Mostly the right foot.  Occ will notice swelling there. Painful to stand on it when first get out of bed in the AM.  Using Advil.   Periods have been very irregular for about a year.  She has always had heavy peirods.  Changes pad about every 1.5 hours.  Periods are lasting about a week each time.  LMP was   Stiff and sore in her shoulders.  Occ uses her mobic and muscle relaxer.  Hx of bursitis in the right shoulder and has had injection before. Now the left is hurting her.     Review of Systems     Objective:   Physical Exam  Constitutional: She is oriented to person, place, and time. She appears well-developed and well-nourished.  Musculoskeletal:       Right foot with NROM of aankle and toes  Tender at the anterior edge of heel.  No swelling.  Non tender over the arch. Neck with normal range of motion though she does have some popping with rotation that is nonpainful. Shoulders with normal range of motion and strength is 5 out of 5. She is mildly tender over the lateral edge of the acromion. Bilaterally. No actual swelling.  Neurological: She is alert and oriented to person, place, and time.  Skin: Skin is warm and dry.  Psychiatric: She has a normal mood and affect. Her behavior is normal.          Assessment & Plan:  Plantar fascitis - Xray to eval for heel spur as well. She  Has already been  icing, using NSAIDs, and stretches. Will give H.O. On stretches.  Continue Advil TID prn.  Will go ahead refer to podiatry for night splints and injection.  Irregular periods/menorrhagia - Will schedule for pelvic US to eval for fibroids. Will check hormone levels.  Consider peri-menopause.   Consider endometrial bx since over 37. She is not a smoker. Also her last Pap smear was.  Last pap was 1 yr ago, normal.    Shoulder Pain - most likely bursitis in both shoulders. We discussed possibly an injection for some pain relief. She's had this done before responded well. Continue anti-inflammatory and stretches.

## 2011-09-25 ENCOUNTER — Ambulatory Visit (INDEPENDENT_AMBULATORY_CARE_PROVIDER_SITE_OTHER): Payer: BC Managed Care – PPO

## 2011-09-25 DIAGNOSIS — N926 Irregular menstruation, unspecified: Secondary | ICD-10-CM

## 2011-09-25 DIAGNOSIS — N920 Excessive and frequent menstruation with regular cycle: Secondary | ICD-10-CM

## 2011-09-25 LAB — SEDIMENTATION RATE: Sed Rate: 6 mm/hr (ref 0–22)

## 2011-09-25 LAB — COMPLETE METABOLIC PANEL WITH GFR
ALT: 30 U/L (ref 0–35)
AST: 23 U/L (ref 0–37)
BUN: 9 mg/dL (ref 6–23)
CO2: 23 mEq/L (ref 19–32)
Calcium: 9.3 mg/dL (ref 8.4–10.5)
Creat: 0.62 mg/dL (ref 0.50–1.10)
GFR, Est African American: >89 mL/min
GFR, Est Non African American: >89 mL/min
Potassium: 4.3 mEq/L (ref 3.5–5.3)
Sodium: 139 mEq/L (ref 135–145)
Total Bilirubin: 0.5 mg/dL (ref 0.3–1.2)

## 2011-09-25 LAB — PROGESTERONE: Progesterone: 6.3 ng/mL

## 2011-09-25 LAB — FOLLICLE STIMULATING HORMONE: FSH: 2.2 m[IU]/mL

## 2011-09-25 LAB — RHEUMATOID FACTOR: Rhuematoid fact SerPl-aCnc: <10 IU/mL (ref <=14)

## 2011-09-25 LAB — TSH: TSH: 1.824 u[IU]/mL (ref 0.350–4.500)

## 2011-09-27 ENCOUNTER — Telehealth: Payer: Self-pay | Admitting: Family Medicine

## 2011-09-27 NOTE — Telephone Encounter (Signed)
Call pt: Pelvic ultrasound shows that she does have a thickened lining of the uterus. This means it is likely that she will have prolonged bleeding again. No other concerning findings. I would like to wait and see what her next period does. If has prolonged bleeding again then I would like to refer her to gyn for further evaluation.

## 2011-09-27 NOTE — Telephone Encounter (Signed)
Left message on vm

## 2011-10-01 ENCOUNTER — Encounter: Payer: BC Managed Care – PPO | Admitting: Physician Assistant

## 2011-10-03 ENCOUNTER — Ambulatory Visit: Payer: BC Managed Care – PPO | Admitting: Family Medicine

## 2012-02-17 HISTORY — PX: OTHER SURGICAL HISTORY: SHX169

## 2012-03-03 ENCOUNTER — Ambulatory Visit: Payer: BC Managed Care – PPO | Attending: Podiatry | Admitting: Physical Therapy

## 2012-03-03 DIAGNOSIS — R262 Difficulty in walking, not elsewhere classified: Secondary | ICD-10-CM | POA: Insufficient documentation

## 2012-03-03 DIAGNOSIS — M6281 Muscle weakness (generalized): Secondary | ICD-10-CM | POA: Insufficient documentation

## 2012-03-03 DIAGNOSIS — M25676 Stiffness of unspecified foot, not elsewhere classified: Secondary | ICD-10-CM | POA: Insufficient documentation

## 2012-03-03 DIAGNOSIS — M25673 Stiffness of unspecified ankle, not elsewhere classified: Secondary | ICD-10-CM | POA: Insufficient documentation

## 2012-03-03 DIAGNOSIS — M25579 Pain in unspecified ankle and joints of unspecified foot: Secondary | ICD-10-CM | POA: Insufficient documentation

## 2012-03-03 DIAGNOSIS — IMO0001 Reserved for inherently not codable concepts without codable children: Secondary | ICD-10-CM | POA: Insufficient documentation

## 2012-03-05 ENCOUNTER — Ambulatory Visit: Payer: BC Managed Care – PPO | Admitting: Physical Therapy

## 2012-03-09 ENCOUNTER — Ambulatory Visit: Payer: BC Managed Care – PPO | Admitting: Physical Therapy

## 2012-03-12 ENCOUNTER — Ambulatory Visit: Payer: BC Managed Care – PPO | Admitting: Physical Therapy

## 2012-03-16 ENCOUNTER — Ambulatory Visit: Payer: BC Managed Care – PPO | Admitting: Physical Therapy

## 2012-03-19 ENCOUNTER — Ambulatory Visit: Payer: BC Managed Care – PPO | Admitting: Physical Therapy

## 2012-03-23 ENCOUNTER — Ambulatory Visit: Payer: BC Managed Care – PPO | Admitting: Physical Therapy

## 2012-09-21 ENCOUNTER — Ambulatory Visit (INDEPENDENT_AMBULATORY_CARE_PROVIDER_SITE_OTHER): Payer: BC Managed Care – PPO | Admitting: Family Medicine

## 2012-09-21 ENCOUNTER — Encounter: Payer: Self-pay | Admitting: Family Medicine

## 2012-09-21 VITALS — BP 136/79 | HR 92 | Wt 187.0 lb

## 2012-09-21 DIAGNOSIS — L719 Rosacea, unspecified: Secondary | ICD-10-CM

## 2012-09-21 DIAGNOSIS — N926 Irregular menstruation, unspecified: Secondary | ICD-10-CM

## 2012-09-21 DIAGNOSIS — Z0000 Encounter for general adult medical examination without abnormal findings: Secondary | ICD-10-CM

## 2012-09-21 MED ORDER — DESOGESTREL-ETHINYL ESTRADIOL 0.15-0.02/0.01 MG (21/5) PO TABS: 1.0000 | ORAL_TABLET | Freq: Every day | ORAL | Status: DC

## 2012-09-21 MED ORDER — MELOXICAM 15 MG PO TABS: 15.0000 mg | ORAL_TABLET | Freq: Every day | ORAL | Status: DC | PRN

## 2012-09-21 NOTE — Patient Instructions (Signed)
Keep up a regular exercise program and make sure you are eating a healthy diet Try to eat 4 servings of dairy a day, or if you are lactose intolerant take a calcium with vitamin D daily.  Your vaccines are up to date.   

## 2012-09-21 NOTE — Progress Notes (Signed)
  Subjective:     Briana Parks is a 47 y.o. female and is here for a comprehensive physical exam. The patient reports problems - still having heavy periods.  History   Social History  . Marital Status: Married    Spouse Name: N/A    Number of Children: N/A  . Years of Education: N/A   Occupational History  . Not on file.   Social History Main Topics  . Smoking status: Never Smoker   . Smokeless tobacco: Not on file  . Alcohol Use: Yes     Comment: rare  . Drug Use: No  . Sexually Active:      Comment: cake decorator, 2 yrs college, married, 2 children, 4 sodas daily, no regular exercise   Other Topics Concern  . Not on file   Social History Narrative  . No narrative on file   Health Maintenance  Topic Date Due  . Influenza Vaccine  11/23/2012  . Pap Smear  09/12/2013    The following portions of the patient's history were reviewed and updated as appropriate: allergies, current medications, past family history, past social history, past surgical history and problem list.  Review of Systems A comprehensive review of systems was negative.   Objective:    BP 136/79  Pulse 92  Wt 187 lb (84.823 kg)  BMI 30.2 kg/m2 General appearance: alert, cooperative and appears stated age Head: Normocephalic, without obvious abnormality, atraumatic Eyes: conj clear, EOMi, PEERLA Ears: normal TM's and external ear canals both ears Nose: Nares normal. Septum midline. Mucosa normal. No drainage or sinus tenderness. Throat: lips, mucosa, and tongue normal; teeth and gums normal Neck: no adenopathy, no JVD, supple, symmetrical, trachea midline and thyroid not enlarged, symmetric, no tenderness/mass/nodules Back: symmetric, no curvature. ROM normal. No CVA tenderness. Lungs: clear to auscultation bilaterally Breasts: normal appearance, no masses or tenderness Heart: regular rate and rhythm, S1, S2 normal, no murmur, click, rub or gallop Abdomen: soft, non-tender; bowel sounds  normal; no masses,  no organomegaly Extremities: extremities normal, atraumatic, no cyanosis or edema Pulses: 2+ and symmetric Skin: Skin color, texture, turgor normal. No rashes or lesions Lymph nodes: Cervical, supraclavicular, and axillary nodes normal. Neurologic: Alert and oriented X 3, normal strength and tone. Normal symmetric reflexes. Normal coordination and gait    Assessment:    Healthy female exam.      Plan:     See After Visit Summary for Counseling Recommendations  Keep up a regular exercise program and make sure you are eating a healthy diet Try to eat 4 servings of dairy a day, or if you are lactose intolerant take a calcium with vitamin D daily.  Your vaccines are up to date.   Rosacea-encouraged her to speak with her dermatologist on the market called Mirvaso Gel for the redness. She is actually been in about 4 research studies to try to help improve the redness with her rosacea. Unfortunately it has been unsuccessful.  Menorrhagia-wtih irregular periods.  Recommend starting low-dose birth control for better regulation of her cycles and less bleeding. We'll also check hemoglobin today make sure that she is not anemic. She has had normal Pap smear so we'll repeat her next year. Call if any problems or side effects.

## 2012-09-24 LAB — CBC
HCT: 40 % (ref 36.0–46.0)
MCH: 30.2 pg (ref 26.0–34.0)
MCHC: 35 g/dL (ref 30.0–36.0)
MCV: 86.2 fL (ref 78.0–100.0)
RBC: 4.64 MIL/uL (ref 3.87–5.11)
RDW: 14.2 % (ref 11.5–15.5)
WBC: 8.2 10*3/uL (ref 4.0–10.5)

## 2012-09-24 LAB — COMPLETE METABOLIC PANEL WITH GFR
BUN: 11 mg/dL (ref 6–23)
GFR, Est African American: >89 mL/min

## 2012-09-24 LAB — LIPID PANEL
HDL: 33 mg/dL — ABNORMAL LOW (ref >39)
LDL Cholesterol: 117 mg/dL — ABNORMAL HIGH (ref 0–99)

## 2012-12-01 ENCOUNTER — Encounter: Payer: Self-pay | Admitting: Family Medicine

## 2012-12-01 ENCOUNTER — Ambulatory Visit (INDEPENDENT_AMBULATORY_CARE_PROVIDER_SITE_OTHER): Payer: BC Managed Care – PPO | Admitting: Family Medicine

## 2012-12-01 VITALS — BP 124/77 | HR 78 | Wt 195.0 lb

## 2012-12-01 DIAGNOSIS — L719 Rosacea, unspecified: Secondary | ICD-10-CM

## 2012-12-01 DIAGNOSIS — N921 Excessive and frequent menstruation with irregular cycle: Secondary | ICD-10-CM

## 2012-12-01 MED ORDER — BRIMONIDINE TARTRATE 0.33 % EX GEL: 1.0000 "application " | Freq: Every day | CUTANEOUS | Status: DC

## 2012-12-01 MED ORDER — NORGESTIMATE-ETH ESTRADIOL 0.25-35 MG-MCG PO TABS: 1.0000 | ORAL_TABLET | Freq: Every day | ORAL | Status: DC

## 2012-12-01 NOTE — Progress Notes (Signed)
  Subjective:    Patient ID: Briana Parks, female    DOB: Apr 14, 1965, 47 y.o.   MRN: [id number]  HPI   pt reports since starting kariva she has bled for 20 days and when it was time to take the sugar pills nothing happened. the bleeding is not heavy, but spotted daily.    She took the first pap without any problems. When she got to the placebo pills and she did not have her period. But after she started the new pill pack she started to spot for 20 days. In fact she has only not bled for about a week. She's now on the placebo pills on her second pack and has not started bleeding yet. She's had problems with her period on and off for years. After the birth of her last child she would typically bleed for about 10 days. She then tried the Depo-Provera injections but still blood with those.  Rosacea-I. given her some samples of Mirvaso when I last saw her. She has noticed a great improvement. She says her coworkers have noticed a great improvement during the daytime. Even her co-workers have commented about the improvement. She would like a prescription for it.   Review of Systems     Objective:   Physical Exam  Constitutional: She is oriented to person, place, and time. She appears well-developed and well-nourished.  HENT:  Head: Normocephalic and atraumatic.  Neurological: She is alert and oriented to person, place, and time.  Skin: Skin is warm and dry.  Very red flushed facial cheeks.   Psychiatric: She has a normal mood and affect. Her behavior is normal.          Assessment & Plan:  metrorrhagia-will change her Garnette Scheuermann to [employer]. Call if still persistently bleeding. Since she is over 45 consider ultrasound and possible endometrial biopsy if she is not improving. Also consider a short course of Provera to reset her cycle if needed.  Rosacea-prescription sent for Mirvaso. Fortunately, it looks like it's covered on her formulary.

## 2012-12-07 ENCOUNTER — Telehealth: Payer: Self-pay | Admitting: *Deleted

## 2012-12-07 MED ORDER — NORETHIN-ETH ESTRAD TRIPHASIC 0.5/0.75/1-35 MG-MCG PO TABS: 1.0000 | ORAL_TABLET | Freq: Every day | ORAL | Status: DC

## 2012-12-07 NOTE — Telephone Encounter (Signed)
Pt notified. Kimberly Gordon, LPN  

## 2012-12-07 NOTE — Telephone Encounter (Signed)
You changed her BCP to the Sprintec she started them yesterday and her face on right side where her rosacea is is hot and blood red and thinks this is an allergic reaction to it.  Wants to know if you can change this.

## 2012-12-07 NOTE — Telephone Encounter (Signed)
Ok will will send over new rx. It would  Be unusual be we can change.

## 2012-12-17 ENCOUNTER — Encounter: Payer: Self-pay | Admitting: Family Medicine

## 2012-12-17 ENCOUNTER — Ambulatory Visit (INDEPENDENT_AMBULATORY_CARE_PROVIDER_SITE_OTHER): Payer: BC Managed Care – PPO | Admitting: Family Medicine

## 2012-12-17 VITALS — BP 120/76 | HR 72 | Temp 98.4°F | Wt 194.0 lb

## 2012-12-17 DIAGNOSIS — J019 Acute sinusitis, unspecified: Secondary | ICD-10-CM

## 2012-12-17 MED ORDER — AMOXICILLIN-POT CLAVULANATE 875-125 MG PO TABS: 1.0000 | ORAL_TABLET | Freq: Two times a day (BID) | ORAL | Status: DC

## 2012-12-17 MED ORDER — HYDROCODONE-HOMATROPINE 5-1.5 MG/5ML PO SYRP: 5.0000 mL | ORAL_SOLUTION | Freq: Every evening | ORAL | Status: DC | PRN

## 2012-12-17 NOTE — Progress Notes (Signed)
  Subjective:    Patient ID: Briana Parks, female    DOB: [birth year], 47 y.o.   MRN: [id number]  HPI Nasal congestion, cough x 1 weeks.  No fever, chills or sweats. Mild ST. No ear pain. Cough is keeping her up all night.  No facial pain or pressure. OTC cough and cold meds worked at first.    Review of Systems     Objective:   Physical Exam  Constitutional: She is oriented to person, place, and time. She appears well-developed and well-nourished.  HENT:  Head: Normocephalic and atraumatic.  Right Ear: External ear normal.  Left Ear: External ear normal.  Nose: Nose normal.  Mouth/Throat: Oropharynx is clear and moist.  TMs and canals are clear.   Eyes: Conjunctivae and EOM are normal. Pupils are equal, round, and reactive to light.  Neck: Neck supple. No thyromegaly present.  Cardiovascular: Normal rate, regular rhythm and normal heart sounds.   Pulmonary/Chest: Effort normal. She has wheezes.  Slight expiratory on the left lower lung  Lymphadenopathy:    She has no cervical adenopathy.  Neurological: She is alert and oriented to person, place, and time.  Skin: Skin is warm and dry.  Psychiatric: She has a normal mood and affect.          Assessment & Plan:  Acute sinusitis - will treat with Augmentin. Cough medicine given at bedtime. Call if not significantly better in one week. Can continue symptomatic care.

## 2012-12-17 NOTE — Patient Instructions (Signed)
Sinusitis  Sinusitis is redness, soreness, and swelling (inflammation) of the paranasal sinuses. Paranasal sinuses are air pockets within the bones of your face (beneath the eyes, the middle of the forehead, or above the eyes). In healthy paranasal sinuses, mucus is able to drain out, and air is able to circulate through them by way of your nose. However, when your paranasal sinuses are inflamed, mucus and air can become trapped. This can allow bacteria and other germs to grow and cause infection.  Sinusitis can develop quickly and last only a short time (acute) or continue over a long period (chronic). Sinusitis that lasts for more than 12 weeks is considered chronic.   CAUSES   Causes of sinusitis include:  · Allergies.  · Structural abnormalities, such as displacement of the cartilage that separates your nostrils (deviated septum), which can decrease the air flow through your nose and sinuses and affect sinus drainage.  · Functional abnormalities, such as when the small hairs (cilia) that line your sinuses and help remove mucus do not work properly or are not present.  SYMPTOMS   Symptoms of acute and chronic sinusitis are the same. The primary symptoms are pain and pressure around the affected sinuses. Other symptoms include:  · Upper toothache.  · Earache.  · Headache.  · Bad breath.  · Decreased sense of smell and taste.  · A cough, which worsens when you are lying flat.  · Fatigue.  · Fever.  · Thick drainage from your nose, which often is green and may contain pus (purulent).  · Swelling and warmth over the affected sinuses.  DIAGNOSIS   Your caregiver will perform a physical exam. During the exam, your caregiver may:  · Look in your nose for signs of abnormal growths in your nostrils (nasal polyps).  · Tap over the affected sinus to check for signs of infection.  · View the inside of your sinuses (endoscopy) with a special imaging device with a light attached (endoscope), which is inserted into your  sinuses.  If your caregiver suspects that you have chronic sinusitis, one or more of the following tests may be recommended:  · Allergy tests.  · Nasal culture A sample of mucus is taken from your nose and sent to a lab and screened for bacteria.  · Nasal cytology A sample of mucus is taken from your nose and examined by your caregiver to determine if your sinusitis is related to an allergy.  TREATMENT   Most cases of acute sinusitis are related to a viral infection and will resolve on their own within 10 days. Sometimes medicines are prescribed to help relieve symptoms (pain medicine, decongestants, nasal steroid sprays, or saline sprays).   However, for sinusitis related to a bacterial infection, your caregiver will prescribe antibiotic medicines. These are medicines that will help kill the bacteria causing the infection.   Rarely, sinusitis is caused by a fungal infection. In theses cases, your caregiver will prescribe antifungal medicine.  For some cases of chronic sinusitis, surgery is needed. Generally, these are cases in which sinusitis recurs more than 3 times per year, despite other treatments.  HOME CARE INSTRUCTIONS   · Drink plenty of water. Water helps thin the mucus so your sinuses can drain more easily.  · Use a humidifier.  · Inhale steam 3 to 4 times a day (for example, sit in the bathroom with the shower running).  · Apply a warm, moist washcloth to your face 3 to 4 times a day,   or as directed by your caregiver.  · Use saline nasal sprays to help moisten and clean your sinuses.  · Take over-the-counter or prescription medicines for pain, discomfort, or fever only as directed by your caregiver.  SEEK IMMEDIATE MEDICAL CARE IF:  · You have increasing pain or severe headaches.  · You have nausea, vomiting, or drowsiness.  · You have swelling around your face.  · You have vision problems.  · You have a stiff neck.  · You have difficulty breathing.  MAKE SURE YOU:   · Understand these  instructions.  · Will watch your condition.  · Will get help right away if you are not doing well or get worse.  Document Released: 03/11/2005 Document Revised: 06/03/2011 Document Reviewed: 03/26/2011  ExitCare® Patient Information ©2014 [employer].  Sinusitis  Sinusitis is redness, soreness, and swelling (inflammation) of the paranasal sinuses. Paranasal sinuses are air pockets within the bones of your face (beneath the eyes, the middle of the forehead, or above the eyes). In healthy paranasal sinuses, mucus is able to drain out, and air is able to circulate through them by way of your nose. However, when your paranasal sinuses are inflamed, mucus and air can become trapped. This can allow bacteria and other germs to grow and cause infection.  Sinusitis can develop quickly and last only a short time (acute) or continue over a long period (chronic). Sinusitis that lasts for more than 12 weeks is considered chronic.   CAUSES   Causes of sinusitis include:  · Allergies.  · Structural abnormalities, such as displacement of the cartilage that separates your nostrils (deviated septum), which can decrease the air flow through your nose and sinuses and affect sinus drainage.  · Functional abnormalities, such as when the small hairs (cilia) that line your sinuses and help remove mucus do not work properly or are not present.  SYMPTOMS   Symptoms of acute and chronic sinusitis are the same. The primary symptoms are pain and pressure around the affected sinuses. Other symptoms include:  · Upper toothache.  · Earache.  · Headache.  · Bad breath.  · Decreased sense of smell and taste.  · A cough, which worsens when you are lying flat.  · Fatigue.  · Fever.  · Thick drainage from your nose, which often is green and may contain pus (purulent).  · Swelling and warmth over the affected sinuses.  DIAGNOSIS   Your caregiver will perform a physical exam. During the exam, your caregiver may:  · Look in your nose for signs of  abnormal growths in your nostrils (nasal polyps).  · Tap over the affected sinus to check for signs of infection.  · View the inside of your sinuses (endoscopy) with a special imaging device with a light attached (endoscope), which is inserted into your sinuses.  If your caregiver suspects that you have chronic sinusitis, one or more of the following tests may be recommended:  · Allergy tests.  · Nasal culture A sample of mucus is taken from your nose and sent to a lab and screened for bacteria.  · Nasal cytology A sample of mucus is taken from your nose and examined by your caregiver to determine if your sinusitis is related to an allergy.  TREATMENT   Most cases of acute sinusitis are related to a viral infection and will resolve on their own within 10 days. Sometimes medicines are prescribed to help relieve symptoms (pain medicine, decongestants, nasal steroid sprays, or saline sprays).     However, for sinusitis related to a bacterial infection, your caregiver will prescribe antibiotic medicines. These are medicines that will help kill the bacteria causing the infection.   Rarely, sinusitis is caused by a fungal infection. In theses cases, your caregiver will prescribe antifungal medicine.  For some cases of chronic sinusitis, surgery is needed. Generally, these are cases in which sinusitis recurs more than 3 times per year, despite other treatments.  HOME CARE INSTRUCTIONS   · Drink plenty of water. Water helps thin the mucus so your sinuses can drain more easily.  · Use a humidifier.  · Inhale steam 3 to 4 times a day (for example, sit in the bathroom with the shower running).  · Apply a warm, moist washcloth to your face 3 to 4 times a day, or as directed by your caregiver.  · Use saline nasal sprays to help moisten and clean your sinuses.  · Take over-the-counter or prescription medicines for pain, discomfort, or fever only as directed by your caregiver.  SEEK IMMEDIATE MEDICAL CARE IF:  · You have increasing  pain or severe headaches.  · You have nausea, vomiting, or drowsiness.  · You have swelling around your face.  · You have vision problems.  · You have a stiff neck.  · You have difficulty breathing.  MAKE SURE YOU:   · Understand these instructions.  · Will watch your condition.  · Will get help right away if you are not doing well or get worse.  Document Released: 03/11/2005 Document Revised: 06/03/2011 Document Reviewed: 03/26/2011  ExitCare® Patient Information ©2014 [employer].

## 2012-12-28 ENCOUNTER — Other Ambulatory Visit: Payer: Self-pay | Admitting: Family Medicine

## 2013-01-28 ENCOUNTER — Other Ambulatory Visit: Payer: Self-pay

## 2013-08-10 ENCOUNTER — Encounter: Payer: Self-pay | Admitting: Physician Assistant

## 2013-08-10 ENCOUNTER — Ambulatory Visit (INDEPENDENT_AMBULATORY_CARE_PROVIDER_SITE_OTHER): Payer: BC Managed Care – PPO | Admitting: Physician Assistant

## 2013-08-10 VITALS — BP 140/85 | HR 120 | Temp 98.0°F | Ht 66.0 in | Wt 184.0 lb

## 2013-08-10 DIAGNOSIS — R05 Cough: Secondary | ICD-10-CM

## 2013-08-10 DIAGNOSIS — R059 Cough, unspecified: Secondary | ICD-10-CM

## 2013-08-10 DIAGNOSIS — I498 Other specified cardiac arrhythmias: Secondary | ICD-10-CM

## 2013-08-10 DIAGNOSIS — R000 Tachycardia, unspecified: Secondary | ICD-10-CM

## 2013-08-10 DIAGNOSIS — J019 Acute sinusitis, unspecified: Secondary | ICD-10-CM

## 2013-08-10 MED ORDER — HYDROCODONE-HOMATROPINE 5-1.5 MG/5ML PO SYRP: 5.0000 mL | ORAL_SOLUTION | Freq: Every evening | ORAL | Status: DC | PRN

## 2013-08-10 MED ORDER — AMOXICILLIN-POT CLAVULANATE 875-125 MG PO TABS: 1.0000 | ORAL_TABLET | Freq: Two times a day (BID) | ORAL | Status: DC

## 2013-08-10 NOTE — Progress Notes (Signed)
   Subjective:    Patient ID: Briana Parks, female    DOB: 09/06/1965, 48 y.o.   MRN: [id number]  HPI Pt is a 48 yo female who presents to the clinic with cough, Headache, sinus pressure for 5 days. Taking mucinex D, nyquil, and sudafed. Helped at first but not helping now. Cough is dry. No wheezing or SOB.     Review of Systems     Objective:   Physical Exam  Constitutional: She is oriented to person, place, and time. She appears well-developed and well-nourished.  HENT:  Head: Normocephalic and atraumatic.  Right Ear: External ear normal.  Left Ear: External ear normal.  Nose: Nose normal.  Mouth/Throat: Oropharynx is clear and moist. No oropharyngeal exudate.  TM's clear.   Frontal sinus tenderness to palpation.   Eyes: Conjunctivae are normal. Right eye exhibits no discharge. Left eye exhibits no discharge.  Neck: Normal range of motion. Neck supple.  Cardiovascular:  Tachycardia at 120.   Pulmonary/Chest: Effort normal and breath sounds normal.  Lymphadenopathy:    She has no cervical adenopathy.  Neurological: She is alert and oriented to person, place, and time.  Skin:  Bilateral cheeks flushed.   Psychiatric: She has a normal mood and affect. Her behavior is normal.          Assessment & Plan:  Sinusitis/cough- treated with Augmentin for 10 days. Hycodan given for cough. HO given for other symptomatic care. Call if not improving.    Tachycardia- likely due to cough and use of decongestants. Stop decongestants. Monitor pulse if not decreasing please follow up.

## 2013-08-10 NOTE — Patient Instructions (Signed)

## 2013-09-14 ENCOUNTER — Ambulatory Visit (INDEPENDENT_AMBULATORY_CARE_PROVIDER_SITE_OTHER): Payer: BC Managed Care – PPO | Admitting: Family Medicine

## 2013-09-14 ENCOUNTER — Ambulatory Visit (INDEPENDENT_AMBULATORY_CARE_PROVIDER_SITE_OTHER): Payer: BC Managed Care – PPO

## 2013-09-14 ENCOUNTER — Encounter: Payer: Self-pay | Admitting: Family Medicine

## 2013-09-14 VITALS — BP 133/76 | HR 72 | Ht 66.0 in | Wt 184.0 lb

## 2013-09-14 DIAGNOSIS — Z3009 Encounter for other general counseling and advice on contraception: Secondary | ICD-10-CM

## 2013-09-14 DIAGNOSIS — M25562 Pain in left knee: Secondary | ICD-10-CM

## 2013-09-14 DIAGNOSIS — M25569 Pain in unspecified knee: Secondary | ICD-10-CM

## 2013-09-14 DIAGNOSIS — M1710 Unilateral primary osteoarthritis, unspecified knee: Secondary | ICD-10-CM

## 2013-09-14 DIAGNOSIS — G4482 Headache associated with sexual activity: Secondary | ICD-10-CM

## 2013-09-14 MED ORDER — NORETHIN-ETH ESTRAD TRIPHASIC 0.5/0.75/1-35 MG-MCG PO TABS: 1.0000 | ORAL_TABLET | Freq: Every day | ORAL | Status: DC

## 2013-09-14 NOTE — Progress Notes (Signed)
   Subjective:    Patient ID: Briana Parks, female    DOB: 07/19/1965, 48 y.o.   MRN: [id number]  HPI Left knee pain x 4months, walking can make it worse with stinging pain. Will feel ike a pin is sticking in it for 15-20 min and limp.   Swelling from time to time. Old injury from years ago.  She was hit with a softball and tore ligaments and it displaced her kneecap.   Says gets very stiff to the point it is painful. Icing and heat don't help. Will occ use her mobic for knee. Does helps some. Lef kne  Still having HA after sex. She says initially it was having just occasionally but now is happening almost every time she has intercourse. Says it is severe and starts on the top of her head and throbss.  Feel like her head is about to explode. Husband has noticed shallow breathing. Will continue until eventually falls asleep.  No vision changes but does feel dizzy with it.  No CP or SOB or palpitations. She has no prior history significant for headaches or migraines. Her blood pressure is well-controlled. No alleviating factors. Review of Systems     Objective:   Physical Exam  Constitutional: She is oriented to person, place, and time. She appears well-developed and well-nourished.  HENT:  Head: Normocephalic and atraumatic.  Cardiovascular: Normal rate, regular rhythm and normal heart sounds.   Pulmonary/Chest: Effort normal and breath sounds normal.  Musculoskeletal:  Left knee is nontender and not swollen today. She does have crepitus behind the knee. Slight increased anterior drawer on the left compared to the right knee. Negative McMurray's. Strength is 5 out of 5 at the hip knee and ankle on the left and right legs.  Neurological: She is alert and oriented to person, place, and time. No cranial nerve deficit.  Skin: Skin is warm and dry.  Psychiatric: She has a normal mood and affect. Her behavior is normal. Thought content normal.          Assessment & Plan:  Left knee pain -  based on her presentation and history most suspicious for cartilage tear versus osteoarthritis . Continue mobic prn. Will get xrays today for further evaluation. We'll give her a handout for exercises for possible cartilage tear. If not improving then consider further evaluation with MRI and possibly have her see our sports medicine physician.Marland Kitchen.   Sexual HA unclear etiology but I do think it is important to rule out aneurysm et Karie Sodacetera. Her blood pressures well controlled..  I. really don't think this is a medication side effect. She does have an old history of prior head injury and has had one seizure in the past as well. She doesn't really have any palpitations or chest pain with the episodes but consider cardiac workup if neurologic workup is negative.

## 2013-09-15 ENCOUNTER — Ambulatory Visit (INDEPENDENT_AMBULATORY_CARE_PROVIDER_SITE_OTHER): Payer: BC Managed Care – PPO

## 2013-09-15 ENCOUNTER — Telehealth: Payer: Self-pay | Admitting: *Deleted

## 2013-09-15 ENCOUNTER — Other Ambulatory Visit: Payer: Self-pay | Admitting: *Deleted

## 2013-09-15 DIAGNOSIS — R51 Headache: Secondary | ICD-10-CM

## 2013-09-15 DIAGNOSIS — G4482 Headache associated with sexual activity: Secondary | ICD-10-CM

## 2013-09-15 DIAGNOSIS — G4489 Other headache syndrome: Secondary | ICD-10-CM

## 2013-09-15 NOTE — Telephone Encounter (Signed)
Dr. Linford ArnoldMetheney obtained prior authorization for MRI/MRA w/o contrast of head and this was done by peer to peer review. Auth good only for 30 days [id number]. Jenna in radiology advised.

## 2013-09-16 ENCOUNTER — Other Ambulatory Visit: Payer: Self-pay | Admitting: Family Medicine

## 2013-09-16 DIAGNOSIS — G4482 Headache associated with sexual activity: Secondary | ICD-10-CM

## 2013-09-16 MED ORDER — INDOMETHACIN 25 MG PO CAPS: 25.0000 mg | ORAL_CAPSULE | Freq: Every day | ORAL | Status: DC | PRN

## 2013-09-17 ENCOUNTER — Encounter: Payer: Self-pay | Admitting: Family Medicine

## 2013-09-17 ENCOUNTER — Ambulatory Visit (INDEPENDENT_AMBULATORY_CARE_PROVIDER_SITE_OTHER): Payer: BC Managed Care – PPO | Admitting: Family Medicine

## 2013-09-17 VITALS — BP 126/75 | HR 86 | Ht 66.0 in | Wt 185.0 lb

## 2013-09-17 DIAGNOSIS — R9389 Abnormal findings on diagnostic imaging of other specified body structures: Secondary | ICD-10-CM

## 2013-09-17 DIAGNOSIS — G4482 Headache associated with sexual activity: Secondary | ICD-10-CM

## 2013-09-17 NOTE — Progress Notes (Signed)
   Subjective:    Patient ID: Briana MajorHeather L Parks, female    DOB: 1965/05/31, 48 y.o.   MRN: [id number]  HPI She's here today to go over her MRI results. Please see original in may. She's been having sexual headaches that they actually didn't occur right after intercourse within a couple minutes. They seem to be worse with position change and can last hours until she gets to sleep. They're severe 10 out of 10. And throbbing in nature.   Review of Systems     Objective:   Physical Exam  Constitutional: She is oriented to person, place, and time. She appears well-developed and well-nourished.  Neurological: She is alert and oriented to person, place, and time.  Skin: Skin is warm.  Psychiatric: She has a normal mood and affect. Her behavior is normal.          Assessment & Plan:  Sexual headaches-reviewed MRI results. There's a questionable arachnoid cyst it was difficult to tell him the MRI. I must wonder if this could somehow be moving or shifting infection causing a decrease in cervical spinal fluid pressure which might actually be triggering the headaches after intercourse. I like to refer her to neurology for further evaluation. In the meantime we will use indomethacin for preterm it before intercourse to see if it prevents or decreases the severity of the headaches. If this is not helpful then consider when necessary treatment with a strict hand. She really hasn't with urology scheduled in the medication has been sent to pharmacy. I gave her a copy of the report and she's going to get a copy of the images aren't onto a CD today. Encouraged her call me if she has any problems. Her husband was here for the office visit today as we reviewed the results.  Time spent 20 min, >50% spent discussing her HA and her MRI results.

## 2013-09-30 ENCOUNTER — Ambulatory Visit (INDEPENDENT_AMBULATORY_CARE_PROVIDER_SITE_OTHER): Payer: BC Managed Care – PPO | Admitting: Neurology

## 2013-09-30 ENCOUNTER — Encounter: Payer: Self-pay | Admitting: Neurology

## 2013-09-30 VITALS — BP 120/78 | HR 79 | Ht 66.0 in | Wt 186.0 lb

## 2013-09-30 DIAGNOSIS — J309 Allergic rhinitis, unspecified: Secondary | ICD-10-CM

## 2013-09-30 DIAGNOSIS — R519 Headache, unspecified: Secondary | ICD-10-CM | POA: Insufficient documentation

## 2013-09-30 DIAGNOSIS — R51 Headache: Secondary | ICD-10-CM

## 2013-09-30 HISTORY — DX: Headache, unspecified: R51.9

## 2013-09-30 MED ORDER — PROPRANOLOL HCL 20 MG PO TABS: 20.0000 mg | ORAL_TABLET | Freq: Three times a day (TID) | ORAL | Status: DC

## 2013-09-30 NOTE — Progress Notes (Signed)
PATIENT: PRECIOUS SEGALL DOB: 1965/12/16  HISTORICAL  MCKENLEE MANGHAM is a 48 year old right-handed Caucasian female, accompanied by her husband, referred by the primary care physician Dr. Eppie Gibson for evaluation of headaches, review of her MRI  She had a past medical history of occasionally headaches, bilateral frontal, pressure, couple times a month, was no light noise sensitivity, Tylenol or Advil has been very helpful, trigger for her headaches are usually [place], allergies,  Over the past 2 years, she also developed postcoital headaches, and this headache is located at the vertex region, severe, pounding, as if her head is going to explode, she usually goes to sleep afterwards, wake up will be much better, there was no focal deficit during the episodes, it happened about 50% to time.  For that reason, she received MRI of the brain in September 17 2013 at [facility] imaging, we have reviewed the films together,The right lateral ventricle is larger than the left without corresponding encephalomalacia. 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  Otherwise unremarkable MRI of the brain.  Unremarkable MRA of the intracranial circulation. Specifically, no evidence for intracranial aneurysm.  She was given indomethacin 25 mg as needed for headaches, was not sure it was helpful  She has history of one grand mal seizure in 1994, had MRI of the brain in [place] at that time, was told she had "a black spot in the brain, likely due to previous trauma."  She was getting prescription of Tegretol for a while, no recurrent seizure   REVIEW OF SYSTEMS: Full 14 system review of systems performed and notable only for many years, snoring, joint swelling, energy, headaches, snoring ALLERGIES: Allergies  Allergen Reactions  . Sulfamethoxazole-Trimethoprim     REACTION: Hives  . Tdap [Diphth-Acell Pertussis-Tetanus] Other (See Comments)    Skin reaction with lymph node sweeling    HOME MEDICATIONS: Current Outpatient Prescriptions on File Prior to Visit  Medication Sig Dispense Refill  . AMBULATORY NON FORMULARY MEDICATION Medication Name: 7 cm water pressure CPAP, heated/humidified machine.   Dx : Severe OSA.  1 Units  0  . meloxicam (MOBIC) 15 MG tablet TAKE 1 TABLET (15 MG TOTAL) BY MOUTH DAILY AS NEEDED FOR PAIN.  30 tablet  2  . norethindrone-ethinyl estradiol (ORTHO-NOVUM 7/7/7, 28,) 0.5/0.75/1-35 MG-MCG tablet Take 1 tablet by mouth daily.  1 Package  11     PAST MEDICAL HISTORY: History reviewed. No pertinent past medical history.  PAST SURGICAL HISTORY: Past Surgical History  Procedure Laterality Date  . Hernia repair    . Tonsillectome and adnoidectomy    . Tubal ligation    . Bone spur removal Right 02/17/2012    FAMILY HISTORY: Family History  Problem Relation Age of Onset  . Arthritis Mother   . Fibromyalgia Mother   . Cancer Mother     liver small intestine carcinoid tumor    SOCIAL HISTORY:  History   Social History  . Marital Status: Married    Spouse Name: Fayrene Fearing    Number of Children: 2  . Years of Education: college   Occupational History    [employer]   Social History Main Topics  . Smoking status: Never Smoker   . Smokeless tobacco: Never Used  . Alcohol Use: 0.6 oz/week    1 Glasses of wine per week     Comment: rare  . Drug Use: No  . Sexual Activity: Not on file  Comment: cake decorator, 2 yrs college, married, 2 children, 4 sodas daily, no regular exercise   Other Topics Concern  . Not on file   Social History Narrative   Patient lives at home with her husband Fayrene Fearing(James)   Education two years of college education.   Patient works for [employer]. [employer]   Right handed.   Caffeine three cups of coke daily.     PHYSICAL EXAM   Filed Vitals:   09/30/13 0955  BP: 120/78  Pulse: 79  Height: 5\' 6"  (1.676 m)  Weight: 186 lb (84.369 kg)    Not recorded    Body  mass index is 30.04 kg/(m^2).   Generalized: In no acute distress  Neck: Supple, no carotid bruits   Cardiac: Regular rate rhythm  Pulmonary: Clear to auscultation bilaterally  Musculoskeletal: No deformity  Neurological examination  Mentation: Alert oriented to time, place, history taking, and causual conversation  Cranial nerve II-XII: Pupils were equal round reactive to light. Extraocular movements were full.  Visual field were full on confrontational test. Bilateral fundi were sharp.  Facial sensation and strength were normal. Hearing was intact to finger rubbing bilaterally. Uvula tongue midline.  Head turning and shoulder shrug and were normal and symmetric.Tongue protrusion into cheek strength was normal.  Motor: Normal tone, bulk and strength.  Sensory: Intact to fine touch, pinprick, preserved vibratory sensation, and proprioception at toes.  Coordination: Normal finger to nose, heel-to-shin bilaterally there was no truncal ataxia  Gait: Rising up from seated position without assistance, normal stance, without trunk ataxia, moderate stride, good arm swing, smooth turning, able to perform tiptoe, and heel walking without difficulty.   Romberg signs: Negative  Deep tendon reflexes: Brachioradialis 2/2, biceps 2/2, triceps 2/2, patellar 2/2, Achilles 2/2, plantar responses were flexor bilaterally.   DIAGNOSTIC DATA (LABS, IMAGING, TESTING) - I reviewed patient records, labs, notes, testing and imaging myself where available.  Lab Results  Component Value Date   WBC 8.2 09/24/2012   HGB 14.0 09/24/2012   HCT 40.0 09/24/2012   MCV 86.2 09/24/2012   PLT 281 09/24/2012      Component Value Date/Time   NA 139 09/24/2012 0900   K 4.3 09/24/2012 0900   CL 106 09/24/2012 0900   CO2 24 09/24/2012 0900   GLUCOSE 89 09/24/2012 0900   BUN 11 09/24/2012 0900   CREATININE 0.58 09/24/2012 0900   CREATININE 0.64 02/19/2006 1900   CALCIUM 8.9 09/24/2012 0900   PROT 6.4 09/24/2012 0900   ALBUMIN 4.1  09/24/2012 0900   AST 21 09/24/2012 0900   ALT 25 09/24/2012 0900   ALKPHOS 76 09/24/2012 0900   BILITOT 0.5 09/24/2012 0900   GFRNONAA >89 09/24/2012 0900   GFRAA >89 09/24/2012 0900   Lab Results  Component Value Date   CHOL 169 09/24/2012   HDL 33* 09/24/2012   LDLCALC 117* 09/24/2012   TRIG 96 09/24/2012   CHOLHDL 5.1 09/24/2012   No results found for this basename: HGBA1C   No results found for this basename: VITAMINB12   Lab Results  Component Value Date   TSH 1.824 09/24/2011    ASSESSMENT AND PLAN  Annamarie MajorHeather L Sturdy is a 48 y.o. female complains of postcoital headaches, normal neurological examinations, MRI of the brain showed enlarged right ventricle, chronic changes, no evidence of encephalomalacia.  Will try Inderal 20-40 mg as needed for headaches,   return to clinic in one year with Eber JonesCarolyn   consider repeat MRI of the brain in  one to 2 years,     Levert Feinstein, M.D. Ph.D.  [facility] [address] [place], [place] [address] [phone]

## 2013-10-26 ENCOUNTER — Encounter: Payer: Self-pay | Admitting: Sports Medicine

## 2013-10-26 ENCOUNTER — Ambulatory Visit (INDEPENDENT_AMBULATORY_CARE_PROVIDER_SITE_OTHER): Payer: BC Managed Care – PPO | Admitting: Sports Medicine

## 2013-10-26 VITALS — BP 137/83 | HR 87 | Ht 66.0 in | Wt 189.0 lb

## 2013-10-26 DIAGNOSIS — M1712 Unilateral primary osteoarthritis, left knee: Secondary | ICD-10-CM

## 2013-10-26 DIAGNOSIS — M1710 Unilateral primary osteoarthritis, unspecified knee: Secondary | ICD-10-CM | POA: Diagnosis not present

## 2013-10-26 HISTORY — DX: Unilateral primary osteoarthritis, left knee: M17.12

## 2013-10-26 NOTE — Progress Notes (Signed)
   Subjective:    I'm seeing this patient as a consultation for: Dr. Linford ArnoldMetheney  ZO:[id number]:Left Knee pain  HPI: Patient is a 48 year old woman with a long history of gradually progressive left knee pain associated with remote trauma (softball to knee w/ dislocation in '86). She states that it got much worse when the weather got cold in February. She's tried meloxicam, heating, and cooling without relief. Additionally, she's has a sharp stabbing pain that shoots through the left knee occasionally while walking (and sometimes when sitting) that is associated with a stiffening or locking sensation. Symptoms are moderate, persistent.  Past medical history, Surgical history, Family history not pertinant except as noted below, Social history, Allergies, and medications have been entered into the medical record, reviewed, and no changes needed.   Objective:   General: Well Developed, well nourished, and in no acute distress.  Neuro/Psych: Alert and oriented x3, extra-ocular muscles intact, able to move all 4 extremities, sensation grossly intact. Skin: Warm and dry, no rashes noted.  Respiratory: Not using accessory muscles, speaking in full sentences, trachea midline.  Cardiovascular: Pulses palpable, no extremity edema. Abdomen: Does not appear distended. Left Knee: Normal to inspection with no erythema or effusion or obvious bony abnormalities. Palpation reveals joint line tenderness, patellar tenderness, and MCL tenderness ROM full in flexion and extension and lower leg rotation. No Joint line opening or pain with valgus stress Negative Mcmurray's and Thessalonian tests. Painful patellar compression. Patellar glide with crepitus. Patellar and quadriceps tendons unremarkable. Hamstring and quadriceps strength is normal.   Procedure: Real-time Ultrasound Guided Injection of left knee Device: GE Logiq E  Verbal informed consent obtained.  Time-out conducted.  Noted no overlying erythema,  induration, or other signs of local infection.  Skin prepped in a sterile fashion.  Local anesthesia: Topical Ethyl chloride.  With sterile technique and under real time ultrasound guidance:  2 cc kenalog 40, 4 cc lidocaine injected easily into the suprapatellar recess. Completed without difficulty  Pain immediately resolved suggesting accurate placement of the medication.  Advised to call if fevers/chills, erythema, induration, drainage, or persistent bleeding.  Images permanently stored and available for review in the ultrasound unit.  Impression: Technically successful ultrasound guided injection.  Impression and Recommendations:    There is arthritis in the knee that will be managed with kenalog injection under guidance. Patient will also be given exercises to do at home and a compression sleeve for the knee.   The sharp pain and locking/stiffening sensation is suggestive of meniscal injury. Patient declines an MRI at this time, but was advised that the management of meniscal injury involves MRI and arthroscopy.  This case required medical decision making of moderate complexity.

## 2013-10-26 NOTE — Assessment & Plan Note (Signed)
Injection as above. Continue Mobic as needed. Home rehabilitation excised. Return in one month, consider MRI versus proceeding to viscous supplementation if no better.

## 2013-11-16 ENCOUNTER — Ambulatory Visit: Payer: BC Managed Care – PPO | Admitting: Family Medicine

## 2013-11-23 ENCOUNTER — Other Ambulatory Visit: Payer: Self-pay | Admitting: Family Medicine

## 2013-11-25 ENCOUNTER — Ambulatory Visit: Payer: BC Managed Care – PPO | Admitting: Sports Medicine

## 2013-12-02 ENCOUNTER — Encounter: Payer: Self-pay | Admitting: Sports Medicine

## 2013-12-02 ENCOUNTER — Ambulatory Visit (INDEPENDENT_AMBULATORY_CARE_PROVIDER_SITE_OTHER): Payer: BC Managed Care – PPO | Admitting: Sports Medicine

## 2013-12-02 VITALS — BP 127/81 | HR 75 | Ht 66.0 in | Wt 186.0 lb

## 2013-12-02 DIAGNOSIS — M1710 Unilateral primary osteoarthritis, unspecified knee: Secondary | ICD-10-CM

## 2013-12-02 DIAGNOSIS — M1712 Unilateral primary osteoarthritis, left knee: Secondary | ICD-10-CM

## 2013-12-02 MED ORDER — NAPROXEN-ESOMEPRAZOLE 500-20 MG PO TBEC: 1.0000 | DELAYED_RELEASE_TABLET | Freq: Two times a day (BID) | ORAL | Status: DC

## 2013-12-02 NOTE — Progress Notes (Signed)
  Subjective:    CC: Followup  HPI: Left knee pain: No response, only temporary the day of the injection. Continues to have medial sided and patellofemoral-type pain, this is despite 6 weeks of physician direct rehabilitation, NSAIDs. No discrete mechanical symptoms but does have a sharp pain occasionally.  Past medical history, Surgical history, Family history not pertinant except as noted below, Social history, Allergies, and medications have been entered into the medical record, reviewed, and no changes needed.   Review of Systems: No fevers, chills, night sweats, weight loss, chest pain, or shortness of breath.   Objective:    General: Well Developed, well nourished, and in no acute distress.  Neuro: Alert and oriented x3, extra-ocular muscles intact, sensation grossly intact.  HEENT: Normocephalic, atraumatic, pupils equal round reactive to light, neck supple, no masses, no lymphadenopathy, thyroid nonpalpable.  Skin: Warm and dry, no rashes. Cardiac: Regular rate and rhythm, no murmurs rubs or gallops, no lower extremity edema.  Respiratory: Clear to auscultation bilaterally. Not using accessory muscles, speaking in full sentences. Left Knee: Normal to inspection with no erythema or effusion or obvious bony abnormalities. Exquisitely tender to palpation at the medial joint line. ROM normal in flexion and extension and lower leg rotation. Ligaments with solid consistent endpoints including ACL, PCL, LCL, MCL. Negative Mcmurray's and provocative meniscal tests. Non painful patellar compression. Patellar and quadriceps tendons unremarkable. Hamstring and quadriceps strength is normal.  Impression and Recommendations:

## 2013-12-02 NOTE — Assessment & Plan Note (Signed)
There is likely degenerative meniscal tear. Adding Vimovo, and we are going to obtain an MRI, she had no response longer than one day from her knee injection. Return to go over her MRI results

## 2013-12-06 ENCOUNTER — Ambulatory Visit (INDEPENDENT_AMBULATORY_CARE_PROVIDER_SITE_OTHER): Payer: BC Managed Care – PPO

## 2013-12-06 DIAGNOSIS — X58XXXA Exposure to other specified factors, initial encounter: Secondary | ICD-10-CM

## 2013-12-06 DIAGNOSIS — M1712 Unilateral primary osteoarthritis, left knee: Secondary | ICD-10-CM

## 2013-12-06 DIAGNOSIS — IMO0002 Reserved for concepts with insufficient information to code with codable children: Secondary | ICD-10-CM

## 2013-12-13 ENCOUNTER — Telehealth: Payer: Self-pay | Admitting: Family Medicine

## 2013-12-13 ENCOUNTER — Encounter: Payer: Self-pay | Admitting: Sports Medicine

## 2013-12-13 ENCOUNTER — Ambulatory Visit (INDEPENDENT_AMBULATORY_CARE_PROVIDER_SITE_OTHER): Payer: BC Managed Care – PPO | Admitting: Sports Medicine

## 2013-12-13 ENCOUNTER — Other Ambulatory Visit: Payer: Self-pay | Admitting: Family Medicine

## 2013-12-13 VITALS — BP 130/80 | HR 79 | Ht 66.0 in | Wt 187.0 lb

## 2013-12-13 DIAGNOSIS — M1710 Unilateral primary osteoarthritis, unspecified knee: Secondary | ICD-10-CM | POA: Diagnosis not present

## 2013-12-13 DIAGNOSIS — M1712 Unilateral primary osteoarthritis, left knee: Secondary | ICD-10-CM

## 2013-12-13 MED ORDER — TRAMADOL HCL 50 MG PO TABS: ORAL_TABLET | ORAL | Status: DC

## 2013-12-13 NOTE — Telephone Encounter (Signed)
Call pt: i have a few samples of Mirvaso for rosacea that expire this month if she would like the,.

## 2013-12-13 NOTE — Progress Notes (Signed)
  Subjective:    CC: Followup MRI  HPI: Briana Parks returns, back in 1996 she had a softball injury, since then she's had progressively worsening pain suspicious for osteoarthritis, with significant medial joint line pain. She feels injection without any relief, not even temporary so we obtained an MRI, results of which will be dictated below. Pain is continuous, moderate, persistent without radiation. Worse at the medial joint line.  Past medical history, Surgical history, Family history not pertinant except as noted below, Social history, Allergies, and medications have been entered into the medical record, reviewed, and no changes needed.   Review of Systems: No fevers, chills, night sweats, weight loss, chest pain, or shortness of breath.   Objective:    General: Well Developed, well nourished, and in no acute distress.  Neuro: Alert and oriented x3, extra-ocular muscles intact, sensation grossly intact.  HEENT: Normocephalic, atraumatic, pupils equal round reactive to light, neck supple, no masses, no lymphadenopathy, thyroid nonpalpable.  Skin: Warm and dry, no rashes. Cardiac: Regular rate and rhythm, no murmurs rubs or gallops, no lower extremity edema.  Respiratory: Clear to auscultation bilaterally. Not using accessory muscles, speaking in full sentences.  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  Impression and Recommendations:

## 2013-12-13 NOTE — Assessment & Plan Note (Signed)
MRI shows a fairly extensive medial meniscal tear, significant synovitis, possibly even representing pigmented villonodular synovitis, questionable old PCL injury, and bicompartmental osteoarthritis. At this point since she has failed intra-articular injection I do think she needs operative intervention, likely an arthroscopy with partial meniscectomy and chondroplasty. Referral to Dr. Luiz Blare. Return as needed. Adding tramadol as needed for pain in the meantime.

## 2013-12-14 NOTE — Telephone Encounter (Signed)
Left message for patient that samples of Mirvasco will be left at front desk for her per Dr.Metheney.

## 2014-01-17 ENCOUNTER — Other Ambulatory Visit: Payer: Self-pay | Admitting: Orthopedic Surgery

## 2014-01-24 ENCOUNTER — Telehealth: Payer: Self-pay | Admitting: *Deleted

## 2014-01-24 DIAGNOSIS — M1712 Unilateral primary osteoarthritis, left knee: Secondary | ICD-10-CM

## 2014-01-24 MED ORDER — TRAMADOL HCL 50 MG PO TABS: ORAL_TABLET | ORAL | Status: DC

## 2014-01-24 NOTE — Telephone Encounter (Signed)
Briana Parks stopped in office and said that she needs something for pain she is taking 1/2 a tramadol in the morning and usually the other half in the afternoon and her knee is hurting really bad in the evening and she is taking 1-2 tramadol in the evening. She said that she is having surgery in December. Would like a refill of tramadol. Please advise. Corliss SkainsJamie Konni Kesinger, CMA

## 2014-01-24 NOTE — Telephone Encounter (Signed)
Rx in box. 

## 2014-02-02 ENCOUNTER — Encounter: Payer: Self-pay | Admitting: Family Medicine

## 2014-02-02 ENCOUNTER — Ambulatory Visit (INDEPENDENT_AMBULATORY_CARE_PROVIDER_SITE_OTHER): Payer: BC Managed Care – PPO | Admitting: Family Medicine

## 2014-02-02 VITALS — BP 117/60 | HR 58 | Ht 66.0 in | Wt 183.0 lb

## 2014-02-02 DIAGNOSIS — R319 Hematuria, unspecified: Secondary | ICD-10-CM | POA: Diagnosis not present

## 2014-02-02 DIAGNOSIS — N390 Urinary tract infection, site not specified: Secondary | ICD-10-CM

## 2014-02-02 LAB — POCT URINALYSIS DIPSTICK
Bilirubin, UA: NEGATIVE
Blood, UA: NEGATIVE
GLUCOSE UA: NEGATIVE
Ketones, UA: NEGATIVE
Spec Grav, UA: 1.025
UROBILINOGEN UA: 0.2
pH, UA: 5.5

## 2014-02-02 MED ORDER — CIPROFLOXACIN HCL 250 MG PO TABS: ORAL_TABLET | ORAL | Status: AC

## 2014-02-02 NOTE — Progress Notes (Signed)
CC: Briana Parks is a 48 y.o. female is here for No chief complaint on file.   Subjective: HPI:  Dysuria low in the pelvis along with urinary frequency and urgency. Symptoms began yesterday morning around 3 in the morning and woke her from sleep. She also noticed blood in her urine yesterday. Symptoms have barely improved despite taking Azo and leftover Augmentin. Nothing seems to make symptoms better or worse other than above. She denies fevers, chills, flank pain, any other genitourinary complaints. Denies nausea vomiting   Review Of Systems Outlined In HPI  No past medical history on file.  Past Surgical History  Procedure Laterality Date  . Hernia repair    . Tonsillectome and adnoidectomy    . Tubal ligation    . Bone spur removal Right 02/17/2012   Family History  Problem Relation Age of Onset  . Arthritis Mother   . Fibromyalgia Mother   . Cancer Mother     liver small intestine carcinoid tumor    History   Social History  . Marital Status: Married    Spouse Name: Briana FearingJames    Number of Children: 2  . Years of Education: college   Occupational History  .      [employer]   Social History Main Topics  . Smoking status: Never Smoker   . Smokeless tobacco: Never Used  . Alcohol Use: 0.6 oz/week    1 Glasses of wine per week     Comment: rare  . Drug Use: No  . Sexual Activity: Not on file     Comment: cake decorator, 2 yrs college, married, 2 children, 4 sodas daily, no regular exercise   Other Topics Concern  . Not on file   Social History Narrative   Patient lives at home with her husband Briana Parks(James)   Education two years of college education.   Patient works for [employer]. [employer]   Right handed.   Caffeine three cups of coke daily.           Objective: BP 117/60 mmHg  Pulse 58  Ht 5\' 6"  (1.676 m)  Wt 183 lb (83.008 kg)  BMI 29.55 kg/m2  Vital signs reviewed. General: Alert and Oriented, No Acute Distress HEENT: Pupils equal, round, reactive to  light. Conjunctivae clear.  External ears unremarkable.  Moist mucous membranes. Lungs: Clear and comfortable work of breathing, speaking in full sentences without accessory muscle use. Cardiac: Regular rate and rhythm.  Back: No CVA tenderness bilaterally Extremities: No peripheral edema.  Strong peripheral pulses.  Mental Status: No depression, anxiety, nor agitation. Logical though process. Skin: Warm and dry.  Assessment & Plan: [name] was seen today for no specified reason.  Diagnoses and associated orders for this visit:  UTI (lower urinary tract infection) - ciprofloxacin (CIPRO) 250 MG tablet; Take one by mouth twice a day for five days. - Urine culture  Blood in urine - POCT Urinalysis Dipstick    Urinalysis with nitrates highly suspicious for urinary tract infection therefore start Cipro, will follow culture. Encouraged her to call me if no better by Friday.   Return if symptoms worsen or fail to improve.

## 2014-02-04 ENCOUNTER — Telehealth: Payer: Self-pay | Admitting: *Deleted

## 2014-02-04 MED ORDER — CEPHALEXIN 500 MG PO CAPS: 500.0000 mg | ORAL_CAPSULE | Freq: Four times a day (QID) | ORAL | Status: DC

## 2014-02-04 NOTE — Telephone Encounter (Signed)
Pt.notified

## 2014-02-04 NOTE — Telephone Encounter (Signed)
New antibioitic, cephalexin, sent to her [facility].  Culture confirms UTI but antibiotic sensitivities not back yet.

## 2014-02-04 NOTE — Telephone Encounter (Signed)
Pt called back to let R. hommel know that abx are not helping the dysuria. Still having burning with urination.

## 2014-02-05 LAB — URINE CULTURE

## 2014-02-15 ENCOUNTER — Other Ambulatory Visit: Payer: Self-pay | Admitting: Family Medicine

## 2014-02-16 ENCOUNTER — Encounter: Payer: Self-pay | Admitting: Neurology

## 2014-02-23 ENCOUNTER — Telehealth: Payer: Self-pay | Admitting: *Deleted

## 2014-02-23 ENCOUNTER — Emergency Department
Admission: EM | Admit: 2014-02-23 | Discharge: 2014-02-23 | Disposition: A | Discharge status: Home / Self Care | Payer: BC Managed Care – PPO | Source: Home / Self Care | Attending: Family Medicine | Admitting: Family Medicine

## 2014-02-23 ENCOUNTER — Encounter: Payer: Self-pay | Admitting: Emergency Medicine

## 2014-02-23 DIAGNOSIS — J069 Acute upper respiratory infection, unspecified: Secondary | ICD-10-CM

## 2014-02-23 DIAGNOSIS — B9789 Other viral agents as the cause of diseases classified elsewhere: Principal | ICD-10-CM

## 2014-02-23 MED ORDER — AZITHROMYCIN 250 MG PO TABS: ORAL_TABLET | ORAL | Status: DC

## 2014-02-23 MED ORDER — BENZONATATE 200 MG PO CAPS: 200.0000 mg | ORAL_CAPSULE | Freq: Every day | ORAL | Status: DC

## 2014-02-23 NOTE — ED Provider Notes (Signed)
CSN: [id number]     Arrival date & time 02/23/14  1124 History   First MD Initiated Contact with Patient 02/23/14 1144     Chief Complaint  Patient presents with  . Cough  . Nasal Congestion  . Headache  . Dizziness      HPI Comments: Patient awoke yesterday with sinus congestion, fatigue, and headache.  This morning she noted a sore throat, hoarseness, chills, pressure in her anterior chest, and a non-productive cough. 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  The history is provided by the patient.    History reviewed. No pertinent past medical history. Past Surgical History  Procedure Laterality Date  . Hernia repair    . Tonsillectome and adnoidectomy    . Tubal ligation    . Bone spur removal Right 02/17/2012   Family History  Problem Relation Age of Onset  . Arthritis Mother   . Fibromyalgia Mother   . Cancer Mother     liver small intestine carcinoid tumor   History  Substance Use Topics  . Smoking status: Never Smoker   . Smokeless tobacco: Never Used  . Alcohol Use: 0.6 oz/week    1 Glasses of wine per week     Comment: rare   OB History    No data available     Review of Systems + sore throat + hoarseness + cough No pleuritic pain, but has pressure in anterior chest. +wheezing + nasal congestion + post-nasal drainage No sinus pain/pressure No itchy/red eyes No earache No hemoptysis + SOB No fever, + chills No nausea No vomiting No abdominal pain No diarrhea No urinary symptoms No skin rash + fatigue No myalgias + headache Used OTC meds without relief  Allergies  Eggs or egg-derived products; Sulfamethoxazole-trimethoprim; and Tdap  Home Medications   Prior to Admission medications   Medication Sig Start Date End Date Taking? Authorizing Provider  AMBULATORY NON FORMULARY MEDICATION Medication Name: 7 cm water pressure CPAP, heated/humidified machine.   Dx : Severe OSA. 11/12/10   Agapito Gamesatherine D Metheney, MD   azithromycin (ZITHROMAX Z-PAK) 250 MG tablet Take 2 tabs today; then begin one tab once daily for 4 more days. 02/23/14   Lattie HawStephen A Beese, MD  benzonatate (TESSALON) 200 MG capsule Take 1 capsule (200 mg total) by mouth at bedtime. Take as needed for cough 02/23/14   Lattie HawStephen A Beese, MD  meloxicam (MOBIC) 15 MG tablet TAKE 1 TABLET (15 MG TOTAL) BY MOUTH DAILY AS NEEDED FOR PAIN. 02/15/14   Agapito Gamesatherine D Metheney, MD  Naproxen-Esomeprazole 500-20 MG TBEC Take 1 tablet by mouth 2 (two) times daily. 12/02/13   Monica Bectonhomas J Thekkekandam, MD  norethindrone-ethinyl estradiol (ORTHO-NOVUM 7/7/7, 28,) 0.5/0.75/1-35 MG-MCG tablet Take 1 tablet by mouth daily. 09/14/13   Agapito Gamesatherine D Metheney, MD  propranolol (INDERAL) 20 MG tablet Take 1 tablet (20 mg total) by mouth 3 (three) times daily. 09/30/13   Levert FeinsteinYijun Yan, MD  traMADol (ULTRAM) 50 MG tablet 1-2 tabs by mouth Q8 hours, maximum 6 tabs per day. 01/24/14   Monica Bectonhomas J Thekkekandam, MD   BP 122/80 mmHg  Pulse 92  Temp(Src) 98.3 F (36.8 C) (Oral)  Resp 16  Ht 5\' 6"  (1.676 m)  Wt 184 lb (83.462 kg)  BMI 29.71 kg/m2  SpO2 97% Physical Exam Nursing notes and Vital Signs reviewed. Appearance:  Patient appears healthy, stated age, and in no acute distress Eyes:  Pupils are equal, round, and reactive to light  and accomodation.  Extraocular movement is intact.  Conjunctivae are not inflamed  Ears:  Canals normal.  Tympanic membranes normal.  Nose:  Mildly congested turbinates.  No sinus tenderness.    Pharynx:  Normal Neck:  Supple.   Tender posterior nodes are palpated bilaterally  Lungs:  Clear to auscultation.  Breath sounds are equal.  Chest:  Distinct tenderness to palpation over the mid-sternum.  Heart:  Regular rate and rhythm without murmurs, rubs, or gallops.  Abdomen:  Nontender without masses or hepatosplenomegaly.  Bowel sounds are present.  No CVA or flank tenderness.  Extremities:  No edema.  No calf tenderness Skin:  No rash present.   ED Course   Procedures  none   MDM   1. Viral URI with cough; (?pertussis)    Will cover for atypical agents with Z-pack.  Prescription written for Benzonatate [facility](Tessalon) to take at bedtime for night-time cough.  Take plain Mucinex (1200 mg guaifenesin) twice daily for cough and congestion.  May add Sudafed for sinus congestion.   Increase fluid intake, rest. May use Afrin nasal spray (or generic oxymetazoline) twice daily for about 5 days.  Also recommend using saline nasal spray several times daily and saline nasal irrigation (AYR is a common brand) Try warm salt water gargles for sore throat.  Stop all antihistamines for now, and other non-prescription cough/cold preparations. May take Ibuprofen 200mg , 4 tabs every 8 hours with food for chest/sternum discomfort.   Follow-up with family doctor if not improving 7 to 10 days.     Lattie HawStephen A Beese, MD 02/26/14 [phone]

## 2014-02-23 NOTE — Discharge Instructions (Signed)
Take plain Mucinex (1200 mg guaifenesin) twice daily for cough and congestion.  May add Sudafed for sinus congestion.   Increase fluid intake, rest. °May use Afrin nasal spray (or generic oxymetazoline) twice daily for about 5 days.  Also recommend using saline nasal spray several times daily and saline nasal irrigation (AYR is a common brand) °Try warm salt water gargles for sore throat.  °Stop all antihistamines for now, and other non-prescription cough/cold preparations. °May take Ibuprofen 200mg, 4 tabs every 8 hours with food for chest/sternum discomfort. °  °Follow-up with family doctor if not improving 7 to 10 days.  °

## 2014-02-23 NOTE — ED Notes (Signed)
Reports onset of cough and congestion yesterday; no reported fever; no OTC since last night; cannot take Flu vaccination due to egg allergy.

## 2014-02-23 NOTE — Telephone Encounter (Signed)
Briana Parks lmom that she was experiencing SOB and sore throat. [facility]

## 2014-02-24 ENCOUNTER — Ambulatory Visit: Payer: BC Managed Care – PPO | Admitting: Family Medicine

## 2014-03-16 ENCOUNTER — Encounter (HOSPITAL_BASED_OUTPATIENT_CLINIC_OR_DEPARTMENT_OTHER): Payer: Self-pay

## 2014-03-16 ENCOUNTER — Ambulatory Visit (HOSPITAL_BASED_OUTPATIENT_CLINIC_OR_DEPARTMENT_OTHER): Admit: 2014-03-16 | Payer: BC Managed Care – PPO | Admitting: Orthopedic Surgery

## 2014-03-16 HISTORY — PX: KNEE ARTHROSCOPY: SUR90

## 2014-03-16 SURGERY — ARTHROSCOPY, KNEE
Anesthesia: General | Site: Knee | Laterality: Left

## 2014-06-13 ENCOUNTER — Other Ambulatory Visit: Payer: Self-pay | Admitting: Family Medicine

## 2014-08-07 ENCOUNTER — Other Ambulatory Visit: Payer: Self-pay | Admitting: Family Medicine

## 2014-09-12 ENCOUNTER — Encounter: Payer: Self-pay | Admitting: Family Medicine

## 2014-09-12 ENCOUNTER — Other Ambulatory Visit (HOSPITAL_COMMUNITY)
Admission: RE | Admit: 2014-09-12 | Discharge: 2014-09-12 | Disposition: A | Discharge status: Home / Self Care | Payer: BC Managed Care – PPO | Source: Ambulatory Visit | Attending: Family Medicine | Admitting: Family Medicine

## 2014-09-12 ENCOUNTER — Ambulatory Visit (INDEPENDENT_AMBULATORY_CARE_PROVIDER_SITE_OTHER): Payer: BC Managed Care – PPO | Admitting: Family Medicine

## 2014-09-12 VITALS — BP 125/73 | HR 86 | Ht 66.0 in | Wt 188.0 lb

## 2014-09-12 DIAGNOSIS — Z124 Encounter for screening for malignant neoplasm of cervix: Secondary | ICD-10-CM

## 2014-09-12 DIAGNOSIS — Z0000 Encounter for general adult medical examination without abnormal findings: Secondary | ICD-10-CM | POA: Diagnosis not present

## 2014-09-12 DIAGNOSIS — Z114 Encounter for screening for human immunodeficiency virus [HIV]: Secondary | ICD-10-CM

## 2014-09-12 DIAGNOSIS — Z9289 Personal history of other medical treatment: Secondary | ICD-10-CM

## 2014-09-12 DIAGNOSIS — N912 Amenorrhea, unspecified: Secondary | ICD-10-CM

## 2014-09-12 DIAGNOSIS — Z01419 Encounter for gynecological examination (general) (routine) without abnormal findings: Secondary | ICD-10-CM | POA: Insufficient documentation

## 2014-09-12 DIAGNOSIS — Z1151 Encounter for screening for human papillomavirus (HPV): Secondary | ICD-10-CM | POA: Diagnosis present

## 2014-09-12 LAB — POCT URINE PREGNANCY: PREG TEST UR: NEGATIVE

## 2014-09-12 NOTE — Patient Instructions (Signed)
Keep up a regular exercise program and make sure you are eating a healthy diet Try to eat 4 servings of dairy a day, or if you are lactose intolerant take a calcium with vitamin D daily.  Your vaccines are up to date.   

## 2014-09-12 NOTE — Addendum Note (Signed)
Addended by: Deno Etienne on: 09/12/2014 02:43 PM   Modules accepted: Orders

## 2014-09-12 NOTE — Addendum Note (Signed)
Addended by: Nani Gasser D on: 09/12/2014 02:15 PM   Modules accepted: Orders

## 2014-09-12 NOTE — Progress Notes (Addendum)
Subjective:     Briana Parks is a 49 y.o. female and is here for a comprehensive physical exam. The patient reports no problems.  Skipped her periods this month.  Last 3 periods were small and dark brown.   History   Social History  . Marital Status: Married    Spouse Name: Fayrene Fearing  . Number of Children: 2  . Years of Education: college   Occupational History  .      [employer]   Social History Main Topics  . Smoking status: Never Smoker   . Smokeless tobacco: Never Used  . Alcohol Use: 0.6 oz/week    1 Glasses of wine per week     Comment: rare  . Drug Use: No  . Sexual Activity: Not on file     Comment: cake decorator, 2 yrs college, married, 2 children, 4 sodas daily, no regular exercise   Other Topics Concern  . Not on file   Social History Narrative   Patient lives at home with her husband Fayrene Fearing)   Education two years of college education.   Patient works for [employer]. [employer]   Right handed.   Caffeine three cups of coke daily.         Health Maintenance  Topic Date Due  . HIV Screening  05/18/1982  . PAP SMEAR  09/12/2013  . INFLUENZA VACCINE  10/24/2014    The following portions of the patient's history were reviewed and updated as appropriate: allergies, current medications, past family history, past medical history, past social history, past surgical history and problem list.  Review of Systems A comprehensive review of systems was negative.   Objective:    BP 125/73 mmHg  Pulse 86  Ht 5\' 6"  (1.676 m)  Wt 188 lb (85.276 kg)  BMI 30.36 kg/m2 General appearance: alert, cooperative and appears stated age Head: Normocephalic, without obvious abnormality, atraumatic Eyes: conj clear, EOMi, PEERLA Ears: normal TM's and external ear canals both ears Nose: Nares normal. Septum midline. Mucosa normal. No drainage or sinus tenderness. Throat: lips, mucosa, and tongue normal; teeth and gums normal Neck: no adenopathy, no carotid bruit, no JVD, supple,  symmetrical, trachea midline and thyroid not enlarged, symmetric, no tenderness/mass/nodules Back: symmetric, no curvature. ROM normal. No CVA tenderness. Lungs: clear to auscultation bilaterally Breasts: normal appearance, no masses or tenderness Heart: regular rate and rhythm, S1, S2 normal, no murmur, click, rub or gallop Abdomen: soft, non-tender; bowel sounds normal; no masses,  no organomegaly Pelvic: cervix normal in appearance, external genitalia normal, no adnexal masses or tenderness, no cervical motion tenderness, rectovaginal septum normal, uterus normal size, shape, and consistency and vagina normal without discharge Extremities: extremities normal, atraumatic, no cyanosis or edema Pulses: 2+ and symmetric Skin: Skin color, texture, turgor normal. No rashes or lesions Lymph nodes: Cervical, supraclavicular, and axillary nodes normal. Neurologic: Alert and oriented X 3, normal strength and tone. Normal symmetric reflexes. Normal coordination and gait    Assessment:    Healthy female exam.      Plan:     See After Visit Summary for Counseling Recommendations  Keep up a regular exercise program and make sure you are eating a healthy diet Try to eat 4 servings of dairy a day, or if you are lactose intolerant take a calcium with vitamin D daily.  Your vaccines are up to date.  Pap smear results pending.    Amenorrhea-we'll check for urine pregnancy that her tubes are tied and she is on  birth control but since she did miss a period and she section reactive then we'll do this today. Pap smear performed today. If she doesn't have her next cycle then consider pelvic ultrasound to evaluate the thickness of the lining of the uterus. Her that sometimes birth control can actually cause her skip a period.

## 2014-09-13 ENCOUNTER — Other Ambulatory Visit: Payer: Self-pay | Admitting: *Deleted

## 2014-09-13 DIAGNOSIS — Z1231 Encounter for screening mammogram for malignant neoplasm of breast: Secondary | ICD-10-CM

## 2014-09-14 LAB — CYTOLOGY - PAP

## 2014-09-17 LAB — COMPLETE METABOLIC PANEL WITH GFR
ALT: 23 U/L (ref 0–35)
AST: 16 U/L (ref 0–37)
Albumin: 3.9 g/dL (ref 3.5–5.2)
Alkaline Phosphatase: 62 U/L (ref 39–117)
BUN: 15 mg/dL (ref 6–23)
CALCIUM: 8.9 mg/dL (ref 8.4–10.5)
CO2: 27 meq/L (ref 19–32)
Chloride: 103 mEq/L (ref 96–112)
Creat: 0.55 mg/dL (ref 0.50–1.10)
GFR, Est Non African American: >89 mL/min
Glucose, Bld: 85 mg/dL (ref 70–99)
Potassium: 4.4 mEq/L (ref 3.5–5.3)
Sodium: 140 mEq/L (ref 135–145)
Total Bilirubin: 0.5 mg/dL (ref 0.2–1.2)
Total Protein: 6.8 g/dL (ref 6.0–8.3)

## 2014-09-17 LAB — LIPID PANEL
Cholesterol: 171 mg/dL (ref 0–200)
HDL: 40 mg/dL — AB (ref >=46)
LDL Cholesterol: 100 mg/dL — ABNORMAL HIGH (ref 0–99)
Total CHOL/HDL Ratio: 4.3 Ratio
Triglycerides: 157 mg/dL — ABNORMAL HIGH (ref <150)
VLDL: 31 mg/dL (ref 0–40)

## 2014-09-17 LAB — HIV ANTIBODY (ROUTINE TESTING W REFLEX): HIV 1&2 Ab, 4th Generation: NONREACTIVE

## 2014-09-27 ENCOUNTER — Other Ambulatory Visit: Payer: Self-pay | Admitting: Family Medicine

## 2014-10-03 ENCOUNTER — Ambulatory Visit: Payer: BC Managed Care – PPO | Admitting: Nurse Practitioner

## 2014-10-03 ENCOUNTER — Encounter: Payer: Self-pay | Admitting: Nurse Practitioner

## 2014-10-03 ENCOUNTER — Ambulatory Visit (INDEPENDENT_AMBULATORY_CARE_PROVIDER_SITE_OTHER): Payer: BC Managed Care – PPO | Admitting: Nurse Practitioner

## 2014-10-03 VITALS — BP 119/74 | HR 90 | Ht 66.0 in | Wt 191.5 lb

## 2014-10-03 DIAGNOSIS — R51 Headache: Secondary | ICD-10-CM

## 2014-10-03 DIAGNOSIS — R519 Headache, unspecified: Secondary | ICD-10-CM

## 2014-10-03 MED ORDER — PROPRANOLOL HCL 20 MG PO TABS: 20.0000 mg | ORAL_TABLET | Freq: Every day | ORAL | Status: DC

## 2014-10-03 NOTE — Patient Instructions (Signed)
Inderal 20 mg daily will refill Will repeat MRI of the brain next year Follow-up yearly and when necessary Given a list of migraine triggers

## 2014-10-03 NOTE — Progress Notes (Signed)
[facility]  PATIENT: Briana Parks DOB: [birth year]   REASON FOR VISIT: follow up for headache HISTORY FROM:patient    HISTORY OF PRESENT ILLNESS:Briana Parks is a 49 year old right-handed Caucasian female, accompanied by her husband, referred by the primary care physician Dr. Eppie Gibson for evaluation of headaches, review of her MRI She had a past medical history of occasionally headaches, bilateral frontal, pressure, couple times a month, was no light noise sensitivity, Tylenol or Advil has been very helpful, trigger for her headaches are usually [place], allergies, Over the past 2 years, she also developed postcoital headaches, and this headache is located at the vertex region, severe, pounding, as if her head is going to explode, she usually goes to sleep afterwards, wake up will be much better, there was no focal deficit during the episodes, it happened about 50% to time. For that reason, she received MRI of the brain in September 17 2013 at [facility], we have reviewed the films together,The right lateral ventricle is larger than the left without corresponding encephalomalacia. 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  Otherwise unremarkable MRI of the brain.  Unremarkable MRA of the intracranial circulation. Specifically, no evidence for intracranial aneurysm. 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 She is only taking her Inderal when necessary and has maybe 5 headaches a month. She is not aware of any food triggers. She continues to  have postcoital headaches. She has rosacea on her face and says she has been in several research trials without much benefit from medication. MRI of the brain 09/17/2013 shows enlarged right ventricle chronic changes no evidence of encephalomalacia. She returns for reevaluation  REVIEW OF SYSTEMS: Full 14 system review of systems performed and notable only for those listed, all others are neg:  Constitutional: neg  Cardiovascular: neg Ear/Nose/Throat: Ringing in the ears  Skin: neg Eyes: neg Respiratory: neg Gastroitestinal: neg  Hematology/Lymphatic: neg  Endocrine: neg Musculoskeletal: Aching muscles, muscle cramps Allergy/Immunology: neg Neurological: Headache Psychiatric: neg Sleep : Snoring   ALLERGIES: Allergies  Allergen Reactions  . Eggs Or Egg-Derived Products   . Sulfamethoxazole-Trimethoprim     REACTION: Hives  . Tdap [Diphth-Acell Pertussis-Tetanus] Other (See Comments)    Skin reaction with lymph node sweeling    HOME MEDICATIONS: Outpatient Prescriptions Prior to Visit  Medication Sig Dispense Refill  . AMBULATORY NON FORMULARY MEDICATION Medication Name: 7 cm water pressure CPAP, heated/humidified machine.   Dx : Severe OSA. 1 Units 0  . meloxicam (MOBIC) 15 MG tablet TAKE 1 TABLET BY MOUTH EVERY DAY AS NEEDED FOR PAIN 30 tablet 1  . NORTREL 7/7/7 0.5/0.75/1-35 MG-MCG tablet TAKE 1 TABLET BY MOUTH DAILY. 28 tablet 11  . propranolol (INDERAL) 20 MG tablet Take 1 tablet (20 mg total) by mouth 3 (three) times daily. (Patient taking differently: Take 20 mg by mouth 3 (three) times daily as needed. ) 30 tablet 6   No facility-administered medications prior to visit.    PAST MEDICAL HISTORY: History reviewed. No pertinent past medical history.  PAST SURGICAL HISTORY: Past  Surgical History  Procedure Laterality Date  . Hernia repair    . Tonsillectome and adnoidectomy    . Tubal ligation    . Bone spur removal Right 02/17/2012    foot  . Knee arthroscopy  Left 03/16/14    Dr. Althea GrimmerGraves/[facility]     FAMILY HISTORY: Family History  Problem Relation Age of Onset  . Arthritis Mother   . Fibromyalgia Mother   . Cancer Mother     liver small intestine carcinoid tumor    SOCIAL HISTORY: History   Social History  . Marital Status: Married    Spouse Name: [name]  . Number of Children: 2  . Years of Education: college   Occupational History  .      [employer]   Social History Main Topics  . Smoking status: Never Smoker   . Smokeless tobacco: Never Used  . Alcohol Use: 0.6 oz/week    1 Glasses of wine per week     Comment: rare  . Drug Use: No  . Sexual Activity: Not on file     Comment: cake decorator, 2 yrs college, married, 2 children, 4 sodas daily, no regular exercise   Other Topics Concern  . Not on file   Social History Narrative   Patient lives at home with her husband Fayrene Fearing(James)   Education two years of college education.   Patient works for [employer]. [employer]   Right handed.   Caffeine three cups of coke daily.           PHYSICAL EXAM  Filed Vitals:   10/03/14 1312  BP: 119/74  Pulse: 90  Height: 5\' 6"  (1.676 m)  Weight: 191 lb 8 oz (86.864 kg)   Body mass index is 30.92 kg/(m^2). Generalized: In no acute distress Neck: Supple, no carotid bruits  Cardiac: Regular rate rhythm Musculoskeletal: No deformity  Neurological examination Mentation: Alert oriented to time, place, history taking, and causual conversation Cranial nerve II-XII: Pupils were equal round reactive to light. Extraocular movements were full. Visual field were full on confrontational test. Bilateral fundi were sharp. Facial sensation and strength were normal. Hearing was intact to finger rubbing bilaterally. Uvula tongue midline. Head turning and shoulder shrug and were normal and symmetric.Tongue protrusion into cheek strength was normal. Motor: Normal tone, bulk and strength. Sensory: Intact to fine touch, pinprick, preserved  vibratory sensation, and proprioception at toes. Coordination: Normal finger to nose, heel-to-shin bilaterally Gait: Rising up from seated position without assistance, normal stance, without trunk ataxia, moderate stride, good arm swing, smooth turning, able to perform tiptoe, and heel walking without difficulty.  Deep tendon reflexes: Brachioradialis 2/2, biceps 2/2, triceps 2/2, patellar 2/2, Achilles 2/2, plantar responses were flexor bilaterally.  DIAGNOSTIC DATA (LABS, IMAGING, TESTING) -    Component Value Date/Time   NA 140 09/16/2014 0808   K 4.4 09/16/2014 0808   CL 103 09/16/2014 0808   CO2 27 09/16/2014 0808   GLUCOSE 85 09/16/2014 0808   BUN 15 09/16/2014 0808   CREATININE 0.55 09/16/2014 0808   CREATININE 0.64 02/19/2006 1900   CALCIUM 8.9 09/16/2014 0808   PROT 6.8 09/16/2014 0808   ALBUMIN 3.9 09/16/2014 0808   AST 16 09/16/2014 0808   ALT 23 09/16/2014 0808   ALKPHOS 62 09/16/2014 0808   BILITOT 0.5 09/16/2014 0808   GFRNONAA >89 09/16/2014 0808   GFRAA >89 09/16/2014 0808   Lab Results  Component Value Date   CHOL 171 09/16/2014   HDL 40* 09/16/2014  LDLCALC 100* 09/16/2014   TRIG 157* 09/16/2014   CHOLHDL 4.3 09/16/2014    ASSESSMENT AND PLAN  49 y.o. year old female  has a past medical history of postcoital headaches and normal neurologic exam. MRI of the brain June 2015 showed enlarged right ventricle chronic changes no evidence of encephalomalacia.  Inderal 20 mg daily will refill Will repeat MRI of the brain next year Follow-up yearly and when necessary Given a list of migraine triggers Briana Parks, [facility], [facility], APRN  [facility] [address], [address] [place], [place] [address] [phone]

## 2014-10-04 NOTE — Progress Notes (Signed)
I have reviewed and agreed above plan. 

## 2014-11-02 ENCOUNTER — Other Ambulatory Visit: Payer: Self-pay | Admitting: Family Medicine

## 2015-04-24 ENCOUNTER — Ambulatory Visit (INDEPENDENT_AMBULATORY_CARE_PROVIDER_SITE_OTHER): Payer: BC Managed Care – PPO | Admitting: Osteopathic Medicine

## 2015-04-24 ENCOUNTER — Encounter: Payer: Self-pay | Admitting: Osteopathic Medicine

## 2015-04-24 VITALS — BP 135/74 | HR 92 | Temp 99.1°F | Ht 66.0 in | Wt 198.0 lb

## 2015-04-24 DIAGNOSIS — J069 Acute upper respiratory infection, unspecified: Secondary | ICD-10-CM

## 2015-04-24 DIAGNOSIS — R05 Cough: Secondary | ICD-10-CM | POA: Diagnosis not present

## 2015-04-24 DIAGNOSIS — R059 Cough, unspecified: Secondary | ICD-10-CM

## 2015-04-24 MED ORDER — HYDROCODONE-HOMATROPINE 5-1.5 MG/5ML PO SYRP: 5.0000 mL | ORAL_SOLUTION | Freq: Four times a day (QID) | ORAL | Status: DC | PRN

## 2015-04-24 MED ORDER — IPRATROPIUM BROMIDE 0.03 % NA SOLN: 2.0000 | Freq: Two times a day (BID) | NASAL | Status: DC

## 2015-04-24 NOTE — Progress Notes (Signed)
HPI: Briana Parks is a 50 y.o. female who presents to [facility]  today for chief complaint of:  Chief Complaint  Patient presents with  . Cough   . Location: chest . Quality: cough . Duration: 4 days . Modifying factors: has tried the following OTC medications: Sudafed  without relief   Past medical, social and family history reviewed: No past medical history on file. Past Surgical History  Procedure Laterality Date  . Hernia repair    . Tonsillectome and adnoidectomy    . Tubal ligation    . Bone spur removal Right 02/17/2012    foot  . Knee arthroscopy Left 03/16/14    [facility]    Social History  Substance Use Topics  . Smoking status: Never Smoker   . Smokeless tobacco: Never Used  . Alcohol Use: 0.6 oz/week    1 Glasses of wine per week     Comment: rare   Family History  Problem Relation Age of Onset  . Arthritis Mother   . Fibromyalgia Mother   . Cancer Mother     liver small intestine carcinoid tumor    Current Outpatient Prescriptions  Medication Sig Dispense Refill  . AMBULATORY NON FORMULARY MEDICATION Medication Name: 7 cm water pressure CPAP, heated/humidified machine.   Dx : Severe OSA. 1 Units 0  . meloxicam (MOBIC) 15 MG tablet TAKE 1 TABLET BY MOUTH EVERY DAY AS NEEDED FOR PAIN 30 tablet 1  . NORTREL 7/7/7 0.5/0.75/1-35 MG-MCG tablet TAKE 1 TABLET BY MOUTH DAILY. 28 tablet 11  . propranolol (INDERAL) 20 MG tablet Take 1 tablet (20 mg total) by mouth daily. 30 tablet 11   No current facility-administered medications for this visit.   Allergies  Allergen Reactions  . Eggs Or Egg-Derived Products   . Sulfamethoxazole-Trimethoprim     REACTION: Hives  . Tdap [Diphth-Acell Pertussis-Tetanus] Other (See Comments)    Skin reaction with lymph node sweeling      Review of Systems: CONSTITUTIONAL: no fever/chills HEAD/EYES/EARS/NOSE/THROAT: yes headache, no vision change or hearing  change, yes sore throat CARDIAC: No chest pain/pressure/palpitations, no orthopnea RESPIRATORY: yes cough, some blood streaked mucus no shortness of breath GASTROINTESTINAL: no nausea, no vomiting, no abdominal pain/blood in stool/diarrhea/constipation MUSCULOSKELETAL: no myalgia/arthralgia   Exam:  BP 135/74 mmHg  Pulse 92  Temp(Src) 99.1 F (37.3 C) (Oral)  Ht  (1.676 m)  Wt 198 lb (89.812 kg)  BMI 31.97 kg/m2 Constitutional: VSS, see above. General Appearance: alert, well-developed, well-nourished, NAD Eyes: Normal lids and conjunctive, non-icteric sclera, PERRLA Ears, Nose, Mouth, Throat: Normal external inspection ears/nares/mouth/lips/gums, normal TM, MMM; posterior pharynx without erythema, without exudate Neck: No masses, trachea midline. No thyroid enlargement/tenderness/mass appreciated, normal lymph nodes Respiratory: Normal respiratory effort. No  wheeze/rhonchi/rales Cardiovascular: S1/S2 normal, no murmur/rub/gallop auscultated. RRR. No carotid bruit or JVD. No lower extremity edema.   No results found for this or any previous visit (from the past 72 hour(s)).    ASSESSMENT/PLAN:  Cough - Plan: HYDROcodone-homatropine (HYCODAN) 5-1.5 MG/5ML syrup  Acute upper respiratory infection - Plan: ipratropium (ATROVENT) 0.03 % nasal spray    Return if symptoms worsen or fail to improve.

## 2015-04-24 NOTE — Patient Instructions (Signed)
DR. Michal Callicott'S HOME CARE INSTRUCTIONS: UPPER RESPIRATORY ILLNESS AND SINUSITIS   FRIST, A FEW NOTES ON OVER-THE-COUNTER MEDICATIONS!  . USE CAUTION - MANY OVER-THE-COUNTER MEDICATIONS COME IN COMBINATIONS OF MULTIPLE GENERICS. FOR INSTANCE, NYQUIL HAS TYLENOL + COUGH MEDICINE + AN ANTIHISTAMINE, SO BE CAREFUL YOU'RE NOT TAKING A COMBINATION MEDICINE WHICH CONTAINS MEDICATIONS YOU'RE ALSO TAKING SEPARATELY (LIKE NYQUIL SYRUP AS WELL AS TYLENOL PILLS).  . YOUR PHARMACIST CAN HELP YOU AVOID MEDICATION INTERACTIONS AND DUPLICATIONS - ASK FOR THEIR HELP IF YOU ARE CONFUSED OR UNSURE ABOUT WHAT TO PURCHASE OVER-THE-COUNTER!  . REMEMBER - IF YOU'RE EVER CONCERNED ABOUT MEDICATION SIDE EFFECTS, OR IF YOU'RE EVER CONCERNED YOUR SYMPTOMS ARE GETTING WORSE DESPITE TREATMENT, PLEASE CALL THE OFFICE!   TREAT SINUS CONGESTION, RUNNY NOSE & POSTNASAL DRIP: . Treat to increase airflow through sinuses, decrease congestion pain and prevent bacterial growth!  . Remember, only 0.5-2% of sinus infections are due to a bacteria, the rest are due to a virus (usually the common cold)! Trust your doctor when he or she decides whether or not you really need an antibiotic!   NASAL SPRAYS: generally safe and should not interact with other medicines. Can take any of these medications, either alone or together... FLONASE (FLUTICASONE) - 2 sprays in each nostril twice per day (also a great allergy medicine to use long-term!) AFRIN (OXYMETOLAZONE) - use sparingly because it will cause rebound congestion, NEVER USE IN KIDS   SALINE NASAL SPRAY- no limit, but avoid use after other nasal sprays or it can wash the medicine away  ANTIHISTAMINES: Helps dry out runny nose and decreases postnasal drip. Benadryl may cause drowsiness but other preparations should not be as sedating. Certain kinds are not as safe in elderly individuals. OK to use unless Dr A says otherwise.  Only use one of the following... BENADRYL (DIPHENHYDRAMINE) -  25-50 mg every 6 hours ZYRTEC (CETIRIZINE) - 5-10 mg daily CLARITIN (LORATIDINE) - less potent. 10 mg daily ALLEGRA (FEXOFENADINE) - least likely to cause drowsiness! 180 mg daily or 60 mg twice per day  DECONGESTANTS: Helps dry out runny nose and helps with sinus pain. May cause insomnia, or sometimes elevated heart rate. Can cause problems if used often in people with high blood pressure. OK to use unless Dr A says otherwise. NEVER USE IN KIDS UNDER 2 YEARS OLD. Only use one of the following... SUDAFED (PSEUDOEPHEDINE) - 60 mg every 4 - 6 hours, also comes in 120 mg extended release every 12 hours, maximum 240 mg in 24 hours.  SUDAED PE (PHENYLEPHRINE) - 10 mg every 4 - 6 hours, maximum 60 mg per day  COMBINATIONS OF ANTIHISTAMINE + DECONGESTANT: these usually require you to show your ID at the pharmacy counter. You can also purchase these medicines separately as noted above.  Only use one of the following... ZYRTEC-D (CETIRIZINE + PSEUDOEPHEDRINE) - 12 hour formulation as directed CLARITIN-D (LORATIDINE + PSEUDOEPHEDRINE) - 12 and 24 hour formulations as directed ALLEGRA-D (FEXOFENADINE + PSEUDOEPHEDRINE) - 12 and 24 hour formulations as directed  PRESCRIPTION TREATMENT FOR SINUS PROBLEMS: Can include nasal sprays, pills, or antibiotics in the case of true bacterial infection. Not everyone needs an antibiotic but there are other medicines which will help you feel better while your body fights the infection!   TREAT COUGH & SORE THROAT: . Remember, cough is the body's way of protecting your airways and lungs, it's a hard-wired reflex that is tough for medicines to treat!  . Irritation to the airways will cause   cough. This irritation is usually caused by upper airway problems like postnasal drip (treat as above) and viral sore throat, but in severe cases can be due to lower airway problems like bronchitis or pneumonia, which a doctor can usually hear on exam of your lungs or an X-ray. . Sore  throat is almost always due to a virus, but occasionally caused by Strep, which requires antibiotics.  . Exercise and smoking may make cough worse - take it easy while you're sick, and QUIT SMOKING!   . Cough due to viral infection can linger for 2 weeks or so. If you're coughing longer than you think you should, or if the cough is severe, please make an appointment in the office - you may need a chest X-ray.   EXPECTORANT: Used to help clear airways, take these with PLENTY of water to help thin mucus secretions and make the mucus easier to cough up   Only use one of the following... ROBITUSSIN (DEXTROMETHORPHAN OR GUAIFENISEN depending on formulation)  MUCINEX (GUAIFENICEN) - usually longer acting  COUGH DROPS/LOZENGES: Whichever over-the-counter agent you prefer!  Here are some suggestions for ingredients to look for (can take both)... BENZOCAINE - numbing effect, also in CHLORASEPTIC THROAT SPRAY MENTHOL - cooling effect  HONEY: has gone head-to-head in several clinical trials with prescription cough medicines and found to be equally effective! Try 1 Teaspoon Honey + 2 Drops Lemon Juice, as much as you want to use. NONE FOR KIDS UNDER AGE 2  HERBAL TEA: There are certain ingredients which help "coat the throat" to relieve pain  such as ELM BARK, LICORICE ROOT, MARSHMALLOW ROOT  PRESCRIPTION TREATMENT FOR COUGH: Reserved for severe cases. Can include pills, syrups or inhalers.    TREAT ACHES & PAINS, FEVER: . Illness causes aches, pains and fever as your body increases its natural inflammation response to help fight the infection.  . Rest, good hydration and nutrition, and taking anti-inflammatory medications will help.  . Remember: a true fever is a temperature 100.4 or higher. If you have a fever that is 105.0 or higher, that is a dangerous level and needs medical attention in the office or in the ER!    Can take both of these together... IBUPROFEN - 400-800 mg every 6 - 8 hours.  Ibuprofen and similar medications can cause problems for people with heart disease or history of stomach ulcers, check with Dr A first if you're concerned. Lower doses are usually safe and effective.  TYLENOL (ACETAMINOPHEN) - 500-1000 mg every 6 hours. It won't cause problems with heart or stomach.     REMEMBER - THE MOST IMPORTANT THINGS YOU CAN DO TO AVOID CATCHING OR SPREADING ILLNESS INCLUDE:  . COVER YOUR COUGH WITH YOUR ARM, NOT WITH YOUR HANDS!  . DISINFECT COMMONLY USED SURFACES (SUCH AS TELEPHONES & DOORKNOBS) WHEN YOU OR SOMEONE CLOSE TO YOU IS FEELING SICK!  . BE SURE VACCINES ARE UP TO DATE - GET A FLU SHOT EVERY YEAR! . GOOD NUTRITION AND HEALTHY LIFESTYLE WILL HELP YOUR IMMUNE SYSTEM YEAR-ROUND! . AND ABOVE ALL - WASH YOUR HANDS! 

## 2015-08-24 HISTORY — PX: OTHER SURGICAL HISTORY: SHX169

## 2015-09-01 ENCOUNTER — Other Ambulatory Visit: Payer: Self-pay | Admitting: Family Medicine

## 2015-10-03 ENCOUNTER — Ambulatory Visit (INDEPENDENT_AMBULATORY_CARE_PROVIDER_SITE_OTHER): Payer: BC Managed Care – PPO | Admitting: Nurse Practitioner

## 2015-10-03 ENCOUNTER — Encounter: Payer: Self-pay | Admitting: Nurse Practitioner

## 2015-10-03 VITALS — BP 132/86 | HR 74 | Resp 14 | Ht 66.0 in | Wt 200.8 lb

## 2015-10-03 DIAGNOSIS — R51 Headache: Secondary | ICD-10-CM

## 2015-10-03 DIAGNOSIS — R930 Abnormal findings on diagnostic imaging of skull and head, not elsewhere classified: Secondary | ICD-10-CM | POA: Insufficient documentation

## 2015-10-03 DIAGNOSIS — R519 Headache, unspecified: Secondary | ICD-10-CM

## 2015-10-03 MED ORDER — PROPRANOLOL HCL 20 MG PO TABS: 20.0000 mg | ORAL_TABLET | Freq: Every day | ORAL | Status: DC

## 2015-10-03 NOTE — Progress Notes (Signed)
[facility]  PATIENT: Briana Parks DOB: March 14, 1966   REASON FOR VISIT: Follow-up for episodic headache, abnormal MRI of the brain HISTORY FROM: Patient    HISTORY OF PRESENT ILLNESS: Briana Parks is a 50 year old right-handed Caucasian female, accompanied by her husband, referred by the primary care physician Dr. Eppie Gibson for evaluation of headaches, review of her MRI She had a past medical history of occasionally headaches, bilateral frontal, pressure, couple times a month, was no light noise sensitivity, Tylenol or Advil has been very helpful, trigger for her headaches are usually [place], allergies, Over the past 2 years, she also developed postcoital headaches, and this headache is located at the vertex region, severe, pounding, as if her head is going to explode, she usually goes to sleep afterwards, wake up will be much better, there was no focal deficit during the episodes, it happened about 50% to time. For that reason, she received MRI of the brain in September 17 2013 at [facility] imaging, we have reviewed the films together,The right lateral ventricle is larger than the left without corresponding encephalomalacia. 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  Otherwise unremarkable MRI of the brain.  Unremarkable MRA of the intracranial circulation. Specifically, no evidence for intracranial aneurysm. She was given indomethacin 25 mg as needed for headaches, was not sure it was helpful She has history of one grand mal seizure in 1994, had MRI of the brain in [place] at that time, was told she had "a black spot in the brain, likely due to previous trauma." She was getting prescription of Tegretol for a while, no recurrent seizure  UPDATE 7/11/16CM Briana Parks, 50 year old female returns for follow-up. She is only taking her Inderal when necessary and has maybe 5 headaches a month. She is not  aware of any food triggers. She continues to have postcoital headaches. She has rosacea on her face and says she has been in several research trials without much benefit from medication. MRI of the brain 09/17/2013 shows enlarged right ventricle chronic changes no evidence of encephalomalacia. She returns for reevaluation UPDATE 10/03/15 CM Briana Parks, 50 year old female returns for follow-up. She has a history of episodic headaches which are much less severe since she has been on Inderal. She had a heel spur surgery the last of  June and is wearing a boot to the left lower leg. She is touchdown weightbearing. She begins her physical therapy tomorrow. MRI of the brain in 2015 showed enlarged right ventricle chronic changes. She is due for repeat MRI. She returns for reevaluation REVIEW OF SYSTEMS: Full 14 system review of systems performed and notable only for those listed, all others are neg:  Constitutional: neg  Cardiovascular: neg Ear/Nose/Throat: neg  Skin: neg Eyes: neg Respiratory: neg Gastroitestinal: neg  Hematology/Lymphatic: neg  Endocrine: neg Musculoskeletal:neg Allergy/Immunology: neg Neurological: neg Psychiatric: neg Sleep : Obstructive sleep apnea with CPAP   ALLERGIES: Allergies  Allergen Reactions  . Eggs Or Egg-Derived Products   . Sulfamethoxazole-Trimethoprim     REACTION: Hives  . Tdap [Diphth-Acell Pertussis-Tetanus] Other (See Comments)    Skin reaction with lymph node sweeling    HOME MEDICATIONS: Outpatient Prescriptions Prior to Visit  Medication Sig Dispense Refill  . AMBULATORY NON FORMULARY MEDICATION Medication Name: 7 cm water pressure CPAP, heated/humidified machine.   Dx : Severe OSA. 1 Units 0  . HYDROcodone-homatropine (HYCODAN) 5-1.5 MG/5ML syrup Take 5 mLs by mouth every 6 (six) hours  as needed for cough. 120 mL 0  . ipratropium (ATROVENT) 0.03 % nasal spray Place 2 sprays into both nostrils every 12 (twelve) hours. 30 mL 1  . meloxicam  (MOBIC) 15 MG tablet TAKE 1 TABLET BY MOUTH EVERY DAY AS NEEDED FOR PAIN 30 tablet 1  . NORTREL 7/7/7 0.5/0.75/1-35 MG-MCG tablet TAKE 1 TABLET BY MOUTH DAILY. 28 tablet 7  . propranolol (INDERAL) 20 MG tablet Take 1 tablet (20 mg total) by mouth daily. 30 tablet 11   No facility-administered medications prior to visit.    PAST MEDICAL HISTORY: Past Medical History  Diagnosis Date  . Obstructive sleep apnea 7cm water pressure CPAP 10/24/2010    Moderate. Sleep study was performed at [facility] neurological clinic by Dr. Marliss Coots on 10/02/2010. AHI was 19.8 with desaturations to 79%. Frequent severe snoring. Moderate wedge works associated with respiratory events. Difficulty initiating and maintaining sleep with no slow wave sleep. She had severe OSA and REM.   Marland Kitchen ALLERGIC RHINITIS CAUSE UNSPECIFIED 08/03/2008    Qualifier: Diagnosis of  By: Thomos Lemons    . Rosacea 09/13/2010  . Osteoarthritis of left knee 10/26/2013  . Headache 09/30/2013    PAST SURGICAL HISTORY: Past Surgical History  Procedure Laterality Date  . Hernia repair    . Tonsillectome and adnoidectomy    . Tubal ligation    . Bone spur removal Right 02/17/2012    foot  . Knee arthroscopy Left 03/16/14    [facility]     FAMILY HISTORY: Family History  Problem Relation Age of Onset  . Arthritis Mother   . Fibromyalgia Mother   . Cancer Mother     liver small intestine carcinoid tumor    SOCIAL HISTORY: Social History   Social History  . Marital Status: Married    Spouse Name: Briana Parks  . Number of Children: 2  . Years of Education: college   Occupational History  .      [employer]   Social History Main Topics  . Smoking status: Never Smoker   . Smokeless tobacco: Never Used  . Alcohol Use: 0.6 oz/week    1 Glasses of wine per week     Comment: rare  . Drug Use: No  . Sexual Activity: Not on file     Comment: cake decorator, 2 yrs college, married, 2 children, 4 sodas daily, no regular  exercise   Other Topics Concern  . Not on file   Social History Narrative   Patient lives at home with her husband Briana Parks)   Education two years of college education.   Patient works for [employer]. [employer]   Right handed.   Caffeine three cups of coke daily.           PHYSICAL EXAM  Filed Vitals:   10/03/15 0915  BP: 132/86  Pulse: 74  Resp: 14  Height: 5\' 6"  (1.676 m)  Weight: 200 lb 12.8 oz (91.082 kg)   Body mass index is 32.43 kg/(m^2). Generalized: In no acute distress Neck: Supple, no carotid bruits  Cardiac: Regular rate rhythm Skin rosacea on her face Musculoskeletal: No deformity  Neurological examination Mentation: Alert oriented to time, place, history taking, and causual conversation Cranial nerve II-XII: Pupils were equal round reactive to light. Extraocular movements were full. Visual field were full on confrontational test. Bilateral fundi were sharp. Facial sensation and strength were normal. Hearing was intact to finger rubbing bilaterally. Uvula tongue midline. Head turning and shoulder shrug and were  normal and symmetric.Tongue protrusion into cheek strength was normal. Motor: Normal tone, bulk and strength. Sensory: Intact to fine touch,  Coordination: Normal finger to nose, heel-to-shin bilaterally Gait: Rising up from seated position without assistance, normal stance, without trunk ataxia, moderate stride, good arm swing, smooth turning, has boot on left lower leg from heel spur surgery     DIAGNOSTIC DATA (LABS, IMAGING, TESTING) -    Component Value Date/Time   NA 140 09/16/2014 0808   K 4.4 09/16/2014 0808   CL 103 09/16/2014 0808   CO2 27 09/16/2014 0808   GLUCOSE 85 09/16/2014 0808   BUN 15 09/16/2014 0808   CREATININE 0.55 09/16/2014 0808   CREATININE 0.64 02/19/2006 1900   CALCIUM 8.9 09/16/2014 0808   PROT 6.8 09/16/2014 0808   ALBUMIN 3.9 09/16/2014 0808   AST 16 09/16/2014 0808   ALT 23 09/16/2014 0808   ALKPHOS 62  09/16/2014 0808   BILITOT 0.5 09/16/2014 0808   GFRNONAA >89 09/16/2014 0808   GFRAA >89 09/16/2014 0808   Lab Results  Component Value Date   CHOL 171 09/16/2014   HDL 40* 09/16/2014   LDLCALC 100* 09/16/2014   TRIG 157* 09/16/2014   CHOLHDL 4.3 09/16/2014    ASSESSMENT AND PLAN 50 y.o. year old female has a past medical history of postcoital headaches and normal neurologic exam. MRI of the brain June 2015 showed enlarged right ventricle chronic changes no evidence of encephalomalacia. Headaches are in good control with Inderal.  Inderal 20 mg daily will refill Will repeat MRI of the brain and compare to June 2015 Follow-up yearly and when necessary Nilda Riggs, [facility], [facility], APRN  [facility] [address], [address] [place], [place] [address] [phone]

## 2015-10-03 NOTE — Progress Notes (Signed)
I have reviewed and agreed above plan. 

## 2015-10-03 NOTE — Patient Instructions (Addendum)
Inderal 20 mg daily will refill Will repeat MRI of the brain and compare to June 2015 Follow-up yearly and when necessary

## 2015-10-04 ENCOUNTER — Ambulatory Visit (INDEPENDENT_AMBULATORY_CARE_PROVIDER_SITE_OTHER): Payer: BC Managed Care – PPO | Admitting: Physical Therapy

## 2015-10-04 ENCOUNTER — Encounter: Payer: Self-pay | Admitting: Physical Therapy

## 2015-10-04 DIAGNOSIS — R2689 Other abnormalities of gait and mobility: Secondary | ICD-10-CM | POA: Diagnosis not present

## 2015-10-04 DIAGNOSIS — M6281 Muscle weakness (generalized): Secondary | ICD-10-CM

## 2015-10-04 DIAGNOSIS — M25572 Pain in left ankle and joints of left foot: Secondary | ICD-10-CM | POA: Diagnosis not present

## 2015-10-04 DIAGNOSIS — M25672 Stiffness of left ankle, not elsewhere classified: Secondary | ICD-10-CM

## 2015-10-04 NOTE — Therapy (Signed)
[facility] [place] [address] [place], [place], [address] Phone: [phone]   Fax:  [phone]  Physical Therapy Evaluation  Patient Details  Name: Briana Parks MRN: [id number] Date of Birth: 1965-05-04 Referring Provider: Dr Yates Decamp  Encounter Date: 10/04/2015      PT End of Session - 10/04/15 1503    Visit Number 1   Number of Visits 12   Date for PT Re-Evaluation 11/15/15   PT Start Time 1503   PT Stop Time 1558   PT Time Calculation (min) 55 min   Activity Tolerance Patient tolerated treatment well      Past Medical History  Diagnosis Date  . Obstructive sleep apnea 7cm water pressure CPAP 10/24/2010    Moderate. Sleep study was performed at [facility] neurological clinic by Dr. Marliss Coots on 10/02/2010. AHI was 19.8 with desaturations to 79%. Frequent severe snoring. Moderate wedge works associated with respiratory events. Difficulty initiating and maintaining sleep with no slow wave sleep. She had severe OSA and REM.   Marland Kitchen ALLERGIC RHINITIS CAUSE UNSPECIFIED 08/03/2008    Qualifier: Diagnosis of  By: Thomos Lemons    . Rosacea 09/13/2010  . Osteoarthritis of left knee 10/26/2013  . Headache 09/30/2013    Past Surgical History  Procedure Laterality Date  . Hernia repair    . Tonsillectome and adnoidectomy    . Tubal ligation    . Bone spur removal Right 02/17/2012    foot  . Knee arthroscopy Left 03/16/14    [facility]     There were no vitals filed for this visit.       Subjective Assessment - 10/04/15 1503    Subjective Pateint has elective Lt heel surgery on 09/18/15, was nonweightbearing then placed in CAM boot 09/20/15, stitches out 09/28/15 and referred to PT . Been performing ROM exercise at home and using a golf ball to work on scar tissue.    How long can you walk comfortably? around the house only with boot    Patient Stated Goals work out the pain and get his function get rid of  scar tissue.    Currently in Pain? Yes   Pain Score 7    Pain Location Heel   Pain Orientation Left;Posterior  bottom   Pain Descriptors / Indicators Stabbing;Sharp   Pain Type Surgical pain   Pain Onset 1 to 4 weeks ago   Pain Frequency Constant   Aggravating Factors  nothing - stays persistent and will have random stabbing sensations, weight bearing   Pain Relieving Factors using ice, elevation            [facility] PT Assessment - 10/04/15 0001    Assessment   Medical Diagnosis post op Lt heel spur resection   Referring Provider Dr Yates Decamp   Onset Date/Surgical Date 09/18/15   Next MD Visit 11/01/15  getting orthotics   Prior Therapy not for this foot   Precautions   Precautions None   Required Braces or Orthoses --  CAM boot when up   Restrictions   Weight Bearing Restrictions No   Balance Screen   Has the patient fallen in the past 6 months No   Has the patient had a decrease in activity level because of a fear of falling?  No   Is the patient reluctant to leave their home because of a fear of falling?  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    ROM / Strength   AROM / PROM / Strength AROM;Strength;PROM   AROM   AROM Assessment Site Ankle   Right/Left Ankle Left  Rt WNL DF 15 degrees   Left Ankle Dorsiflexion -7   Left Ankle Plantar Flexion 46   Left Ankle Inversion 33   Left Ankle Eversion 27   PROM   PROM Assessment Site Ankle   Right/Left Ankle Left   Left Ankle Dorsiflexion 3   Strength    Strength Assessment Site Hip;Knee;Ankle   Right/Left Hip --  bilat WNL   Right/Left Knee --  Rt WNL, Lt 4+/5   Right/Left Ankle Left  Rt WNL   Left Ankle Dorsiflexion 4+/5   Left Ankle Plantar Flexion 3-/5   Left Ankle Inversion 4+/5   Left Ankle Eversion 4+/5   Palpation   Palpation comment tenderness in Lt calf, medial Lt heel and bottom of the heel.   decreased scar mobility in Lt heel   Ambulation/Gait   Ambulation/Gait Yes   Ambulation/Gait Assistance 7: Independent   Assistive device Other (Comment)  CAM boot Lt   Gait Pattern Antalgic;Step-to pattern  Lt foot external rotataion                   [facility] Adult PT Treatment/Exercise - 10/04/15 0001    Exercises   Exercises Ankle   Modalities   Modalities Electrical Stimulation;Ultrasound;Vasopneumatic   Electrical Stimulation   Electrical Stimulation Location Lt foot   Electrical Stimulation Action IFC   Electrical Stimulation Parameters to tolerance   Electrical Stimulation Goals Edema;Pain   Ultrasound   Ultrasound Location Lt heel, 4' lateral, 4' bottom of heel   Ultrasound Parameters 50%, 3.183mHz, 0.80 w/cm2   Ultrasound Goals Pain  scar tissue break up   Vasopneumatic   Number Minutes Vasopneumatic  15 minutes   Vasopnuematic Location  Ankle  Lt foot   Vasopneumatic Pressure Low   Vasopneumatic Temperature  3*   Manual Therapy   Manual Therapy Soft tissue mobilization   Soft tissue mobilization cross friction massage to Lt heel incision and bottom of heel   Ankle Exercises: Stretches   Soleus Stretch 1 rep;30 seconds   Gastroc Stretch 1 rep;30 seconds   Other Stretch ankle stretch with foot on stool x 20sec                PT Education - 10/04/15 1529    Education provided Yes   Education Details HEP   Person(s) Educated Patient   Methods Explanation;Demonstration;Handout   Comprehension Returned demonstration;Verbalized understanding             PT Long Term Goals -  10/04/15 1551    PT LONG TERM GOAL #1   Title I with advanced HEP ( 11/15/15)    Time 6   Period Weeks   Status New   PT LONG TERM GOAL #2   Title increase Lt ankle dorsiflexion =/> 15 degrees ( 11/15/15)    Time 6   Period Weeks   Status  New   PT LONG TERM GOAL #3   Title demo Rt ankle/knee strength =/> 5-/5 ( 11/15/15)    Time 6   Period Weeks   Status New   PT LONG TERM GOAL #4   Title ambulate without CAM boot and normalized gait ( 11/15/15)    Time 6   Period Weeks   Status New   PT LONG TERM GOAL #5   Title improve FOTO =/< 39% limited ( 11/15/15)    Time 6   Period Weeks   Status New               Plan - 10/04/15 1548    Clinical Impression Statement 50 yo female s/p elective surgery 09/18/15 to remove Lt heel spur.  She is now wearing a CAM boot when up.  She has limited ROM, strength and gait disorder.  She also has edema and some scar tissue in the Lt heel and around the incision.    Rehab Potential Good   PT Frequency 2x / week   PT Duration 6 weeks   PT Treatment/Interventions Ultrasound;Neuromuscular re-education;Passive range of motion;Patient/family education;Gait training;Electrical Stimulation;Iontophoresis 4mg /ml Dexamethasone;Cryotherapy;Moist Heat;Therapeutic exercise;Manual techniques;Vasopneumatic Device;Taping;Dry needling   PT Next Visit Plan [place] and manual therapy to break up scar tissue, ankle ROM, strengthening and proprioception    Consulted and Agree with Plan of Care Patient      Patient will benefit from skilled therapeutic intervention in order to improve the following deficits and impairments:  Increased edema, Decreased strength, Pain, Decreased scar mobility, Decreased range of motion, Difficulty walking  Visit Diagnosis: Pain in left ankle and joints of left foot - Plan: PT plan of care cert/re-cert  Stiffness of left ankle, not elsewhere classified - Plan: PT plan of care cert/re-cert  Other abnormalities of gait and mobility - Plan:  PT plan of care cert/re-cert  Muscle weakness (generalized) - Plan: PT plan of care cert/re-cert     Problem List Patient Active Problem List   Diagnosis Date Noted  . Abnormal MRI of head 10/03/2015  . Osteoarthritis of left knee 10/26/2013  . Headache 09/30/2013  . Obstructive sleep apnea 7cm water pressure CPAP 10/24/2010  . Rosacea 09/13/2010  . ALLERGIC RHINITIS CAUSE UNSPECIFIED 08/03/2008  . SPONDYLOLISTHESIS, ACQUIRED 05/21/2006  . SCOLIOSIS 12/31/2005  . SHOULDER/ARM SPRAIN/STRAIN, UNSPEC. 12/31/2005    Roderic Scarce PT  10/04/2015, 3:58 PM  [facility] [address] [place], [place], [address] Phone: [phone]   Fax:  [phone]  Name: Briana Parks MRN: [id number] Date of Birth: Aug 01, 1965

## 2015-10-04 NOTE — Patient Instructions (Signed)
[place] [phone]   **  Keep performing range of motion exercises, make the ABC's larger, keep icing.   Gastroc Stretch       Stand with right foot back, leg straight, forward leg bent. Keeping heel on floor, turned slightly out, lean into wall until stretch is felt in calf. Hold _30-45___ seconds. Repeat __1__ times per set. Do ___1_ sets per session. Do _2-3___ sessions per day.  Soleus Stretch    Stand with right foot back, both knees bent. Keeping heel on floor, turned slightly out, lean into wall until stretch is felt in lower calf. Hold __30-45__ seconds. Repeat _1___ times per set. Do __1__ sets per session. Do _2-3___ sessions per day.   Dorsiflexion: Self-Mobilization (Standing)    With right foot on step, lean forward until gentle stretch is felt. Hold __15-20__ seconds. Relax. Repeat _5___ times per set. Do __1__ sets per session. Do _2-3___ sessions per day. Copyright  VHI. All rights reserved.

## 2015-10-10 ENCOUNTER — Ambulatory Visit (INDEPENDENT_AMBULATORY_CARE_PROVIDER_SITE_OTHER): Payer: BC Managed Care – PPO | Admitting: Physical Therapy

## 2015-10-10 DIAGNOSIS — M25672 Stiffness of left ankle, not elsewhere classified: Secondary | ICD-10-CM

## 2015-10-10 DIAGNOSIS — R2689 Other abnormalities of gait and mobility: Secondary | ICD-10-CM

## 2015-10-10 DIAGNOSIS — M6281 Muscle weakness (generalized): Secondary | ICD-10-CM

## 2015-10-10 DIAGNOSIS — M25572 Pain in left ankle and joints of left foot: Secondary | ICD-10-CM

## 2015-10-10 NOTE — Therapy (Signed)
[facility] [place] [address] [place], [place], [address] Phone: [phone]   Fax:  [phone]  Physical Therapy Treatment  Patient Details  Name: Briana Parks MRN: [id number] Date of Birth: 16-Sep-1965 Referring Provider: Dr. Yates Decamp  Encounter Date: 10/10/2015      PT End of Session - 10/10/15 1017    Visit Number 2   Number of Visits 12   Date for PT Re-Evaluation 11/15/15   PT Start Time 0930   PT Stop Time 1017   PT Time Calculation (min) 47 min      Past Medical History  Diagnosis Date  . Obstructive sleep apnea 7cm water pressure CPAP 10/24/2010    Moderate. Sleep study was performed at [facility] neurological clinic by Dr. Marliss Coots on 10/02/2010. AHI was 19.8 with desaturations to 79%. Frequent severe snoring. Moderate wedge works associated with respiratory events. Difficulty initiating and maintaining sleep with no slow wave sleep. She had severe OSA and REM.   Marland Kitchen ALLERGIC RHINITIS CAUSE UNSPECIFIED 08/03/2008    Qualifier: Diagnosis of  By: Thomos Lemons    . Rosacea 09/13/2010  . Osteoarthritis of left knee 10/26/2013  . Headache 09/30/2013    Past Surgical History  Procedure Laterality Date  . Hernia repair    . Tonsillectome and adnoidectomy    . Tubal ligation    . Bone spur removal Right 02/17/2012    foot  . Knee arthroscopy Left 03/16/14    [facility]     There were no vitals filed for this visit.      Subjective Assessment - 10/10/15 0940    Subjective Pt has been weaning herself out of boot for last few days.  Pain has reduced since last visit. Has been performing HEP daily.     Patient Stated Goals work out the pain and get his function get rid of scar tissue.  Ready to return to work on 11/09/15   Currently in Pain? Yes   Pain Score 1   4/10 with weight bearing    Pain Location Heel   Pain Orientation Left;Posterior   Pain Descriptors / Indicators Sharp   Aggravating Factors  WB   Pain Relieving Factors ice, elevation            [facility] PT Assessment - 10/10/15 0001    Assessment   Medical Diagnosis post op Lt heel spur resection   Referring Provider Dr. Yates Decamp   Onset Date/Surgical Date 09/18/15   Next MD Visit 11/01/15  getting orthotics   AROM   Right/Left Ankle Left   Left Ankle Dorsiflexion 3   Left Ankle Plantar Flexion 60   Left Ankle Inversion 38   Left Ankle Eversion 22   PROM   PROM Assessment Site Ankle   Right/Left Ankle Left   Left Ankle Dorsiflexion 5   Strength   Left Ankle Dorsiflexion 5/5   Left Ankle Plantar Flexion --  not tested   Left Ankle Inversion --  5-/5   Left Ankle Eversion --  5-/5          [facility] Adult PT Treatment/Exercise - 10/10/15 0001    Ultrasound   Ultrasound Location Lt heel    Ultrasound Parameters 50%, 3.3 mHz, 0.8 w/cm2, 8 min    Ultrasound Goals Pain  scar tissue break up   Manual Therapy   Manual Therapy Soft tissue mobilization;Other (comment)   Soft tissue mobilization muscle stripping to Lt plantar fascia, calf; cross fiber friction  to incision area.    Other Manual Therapy ice massage to Lt heel incision - 3 min    Ankle Exercises: Stretches   Soleus Stretch 2 reps;30 seconds   Gastroc Stretch 2 reps;30 seconds   Other Stretch ankle stretch with foot on stool x 20sec   Ankle Exercises: Aerobic   Stationary Bike NuStep L4: 6 min                      PT Long Term Goals - 10/04/15 1551    PT LONG TERM GOAL #1   Title I with advanced HEP ( 11/15/15)    Time 6   Period Weeks   Status New   PT LONG TERM GOAL #2   Title increase Lt ankle dorsiflexion =/> 15 degrees ( 11/15/15)    Time 6   Period Weeks   Status New   PT LONG TERM GOAL #3   Title demo Rt ankle/knee strength =/> 5-/5 ( 11/15/15)    Time 6   Period Weeks   Status New   PT LONG TERM GOAL #4   Title ambulate without CAM boot and normalized gait ( 11/15/15)    Time 6   Period Weeks    Status New   PT LONG TERM GOAL #5   Title improve FOTO =/< 39% limited ( 11/15/15)    Time 6   Period Weeks   Status New               Plan - 10/10/15 1325    Clinical Impression Statement Pt demonstrated improved Lt ankle strength and ROM.  Pt tolerated all exercises well.  Pt continues with antalgic gait.  Progressing towards goals.    Rehab Potential Good   PT Frequency 2x / week   PT Duration 6 weeks   PT Treatment/Interventions Ultrasound;Neuromuscular re-education;Passive range of motion;Patient/family education;Gait training;Electrical Stimulation;Iontophoresis 4mg /ml Dexamethasone;Cryotherapy;Moist Heat;Therapeutic exercise;Manual techniques;Vasopneumatic Device;Taping;Dry needling   PT Next Visit Plan US and manual therapy to break up scar tissue, ankle ROM, strengthening and proprioception    Consulted and Agree with Plan of Care Patient      Patient will benefit from skilled therapeutic intervention in order to improve the following deficits and impairments:  Increased edema, Decreased strength, Pain, Decreased scar mobility, Decreased range of motion, Difficulty walking  Visit Diagnosis: Pain in left ankle and joints of left foot  Stiffness of left ankle, not elsewhere classified  Other abnormalities of gait and mobility  Muscle weakness (generalized)     Problem List Patient Active Problem List   Diagnosis Date Noted  . Abnormal MRI of head 10/03/2015  . Osteoarthritis of left knee 10/26/2013  . Headache 09/30/2013  . Obstructive sleep apnea 7cm water pressure CPAP 10/24/2010  . Rosacea 09/13/2010  . ALLERGIC RHINITIS CAUSE UNSPECIFIED 08/03/2008  . SPONDYLOLISTHESIS, ACQUIRED 05/21/2006  . SCOLIOSIS 12/31/2005  . SHOULDER/ARM SPRAIN/STRAIN, UNSPEC. 12/31/2005   Mayer CamelJennifer Carlson-Long, PTA 10/10/2015 1:27 PM  [facility] [place] [address] [place], [place], [address] Phone: [phone]   Fax:   [phone]  Name: Briana MajorHeather L Parks MRN: [id number] Date of Birth: 1965/11/17

## 2015-10-13 ENCOUNTER — Encounter: Payer: Self-pay | Admitting: Rehabilitative and Restorative Service Providers"

## 2015-10-13 ENCOUNTER — Ambulatory Visit (INDEPENDENT_AMBULATORY_CARE_PROVIDER_SITE_OTHER): Payer: BC Managed Care – PPO | Admitting: Rehabilitative and Restorative Service Providers"

## 2015-10-13 DIAGNOSIS — M25572 Pain in left ankle and joints of left foot: Secondary | ICD-10-CM | POA: Diagnosis not present

## 2015-10-13 DIAGNOSIS — M25672 Stiffness of left ankle, not elsewhere classified: Secondary | ICD-10-CM | POA: Diagnosis not present

## 2015-10-13 DIAGNOSIS — R2689 Other abnormalities of gait and mobility: Secondary | ICD-10-CM | POA: Diagnosis not present

## 2015-10-13 DIAGNOSIS — M6281 Muscle weakness (generalized): Secondary | ICD-10-CM

## 2015-10-13 NOTE — Therapy (Signed)
[facility] [place] [address] [place], [place], [address] Phone: [phone]   Fax:  [phone]  Physical Therapy Treatment  Patient Details  Name: Briana Parks MRN: [id number] Date of Birth: 1965/04/21 Referring Provider: Dr. Yates Decamp  Encounter Date: 10/13/2015      PT End of Session - 10/13/15 0811    Visit Number 3   Number of Visits 12   Date for PT Re-Evaluation 11/15/15   PT Start Time 0808   PT Stop Time 0859   PT Time Calculation (min) 51 min   Activity Tolerance Patient tolerated treatment well      Past Medical History  Diagnosis Date  . Obstructive sleep apnea 7cm water pressure CPAP 10/24/2010    Moderate. Sleep study was performed at [facility] neurological clinic by Dr. Marliss Coots on 10/02/2010. AHI was 19.8 with desaturations to 79%. Frequent severe snoring. Moderate wedge works associated with respiratory events. Difficulty initiating and maintaining sleep with no slow wave sleep. She had severe OSA and REM.   Marland Kitchen ALLERGIC RHINITIS CAUSE UNSPECIFIED 08/03/2008    Qualifier: Diagnosis of  By: Thomos Lemons    . Rosacea 09/13/2010  . Osteoarthritis of left knee 10/26/2013  . Headache 09/30/2013    Past Surgical History  Procedure Laterality Date  . Hernia repair    . Tonsillectome and adnoidectomy    . Tubal ligation    . Bone spur removal Right 02/17/2012    foot  . Knee arthroscopy Left 03/16/14    Dr[facility]     There were no vitals filed for this visit.      Subjective Assessment - 10/13/15 0815    Subjective Pt has been weaning herself out of boot for last few days.  No pain today, still stiff and she has a limp  Has been performing HEP daily.     Currently in Pain? No/denies                         [facility] Adult PT Treatment/Exercise - 10/13/15 0001    Exercises   Exercises --  prone hip ext alt x20 each   Ultrasound   Ultrasound Location Lt heel   Ultrasound Parameters 50%; 3.3 mHz; 0.8w/cm2; 8 min    Manual Therapy   Manual Therapy Soft tissue mobilization;Other (comment)   Soft tissue mobilization muscle stripping to Lt plantar fascia, calf; cross fiber friction to incision area.    Other Manual Therapy ice massage to Lt heel incision - 3 min    Ankle Exercises: Stretches   Soleus Stretch 2 reps;30 seconds   Gastroc Stretch 2 reps;30 seconds   Other Stretch ankle stretch with foot on stool x 20sec   Ankle Exercises: Aerobic   Stationary Bike NuStep L4: 7 min    Ankle Exercises: Standing   SLS 15 sec x 3    Other Standing Ankle Exercises staggered stance weight shift forward and back x 20 each LE forward    Ankle Exercises: Sidelying   Other Sidelying Ankle Exercises hip abduction x20 leading with heel/   Ankle Exercises: Seated   Other Seated Ankle Exercises PROM/heel cord stretch with strap 45 sec x 3 Lt                      PT Long Term Goals - 10/13/15 4034    PT LONG TERM GOAL #1   Title I with advanced HEP (  11/15/15)    Time 6   Period Weeks   Status On-going   PT LONG TERM GOAL #2   Title increase Lt ankle dorsiflexion =/> 15 degrees ( 11/15/15)    Time 6   Period Weeks   Status On-going   PT LONG TERM GOAL #3   Title demo Rt ankle/knee strength =/> 5-/5 ( 11/15/15)    Time 6   Period Weeks   Status On-going   PT LONG TERM GOAL #4   Title ambulate without CAM boot and normalized gait ( 11/15/15)    Time 6   Period Weeks   Status On-going   PT LONG TERM GOAL #5   Title improve FOTO =/< 39% limited ( 11/15/15)    Time 6   Period Weeks   Status On-going               Plan - 10/13/15 [id number]    Clinical Impression Statement Gradually weaning from walking boot. No pain per pt report. Working on her exercises at home. Progressing toward stated goals of therapy.    Rehab Potential Good   PT Frequency 2x / week   PT Duration 6 weeks   PT Treatment/Interventions Ultrasound;Neuromuscular  re-education;Passive range of motion;Patient/family education;Gait training;Electrical Stimulation;Iontophoresis 4mg /ml Dexamethasone;Cryotherapy;Moist Heat;Therapeutic exercise;Manual techniques;Vasopneumatic Device;Taping;Dry needling   PT Next Visit Plan US and manual therapy to break up scar tissue, ankle ROM, strengthening and proprioception    Consulted and Agree with Plan of Care Patient      Patient will benefit from skilled therapeutic intervention in order to improve the following deficits and impairments:  Increased edema, Decreased strength, Pain, Decreased scar mobility, Decreased range of motion, Difficulty walking  Visit Diagnosis: Pain in left ankle and joints of left foot  Stiffness of left ankle, not elsewhere classified  Other abnormalities of gait and mobility  Muscle weakness (generalized)     Problem List Patient Active Problem List   Diagnosis Date Noted  . Abnormal MRI of head 10/03/2015  . Osteoarthritis of left knee 10/26/2013  . Headache 09/30/2013  . Obstructive sleep apnea 7cm water pressure CPAP 10/24/2010  . Rosacea 09/13/2010  . ALLERGIC RHINITIS CAUSE UNSPECIFIED 08/03/2008  . SPONDYLOLISTHESIS, ACQUIRED 05/21/2006  . SCOLIOSIS 12/31/2005  . SHOULDER/ARM SPRAIN/STRAIN, UNSPEC. 12/31/2005    Celyn Rober MinionP Holt PT, MPH   10/13/2015, 8:50 AM  [facility] [address] [place], [place], [address] Phone: [phone]   Fax:  [phone]  Name: Briana Parks MRN: [id number] Date of Birth: February 14, 1966

## 2015-10-16 ENCOUNTER — Ambulatory Visit (INDEPENDENT_AMBULATORY_CARE_PROVIDER_SITE_OTHER): Payer: BC Managed Care – PPO | Admitting: Family Medicine

## 2015-10-16 ENCOUNTER — Encounter: Payer: Self-pay | Admitting: Family Medicine

## 2015-10-16 VITALS — BP 126/56 | HR 86 | Ht 66.0 in | Wt 200.0 lb

## 2015-10-16 DIAGNOSIS — Z1231 Encounter for screening mammogram for malignant neoplasm of breast: Secondary | ICD-10-CM | POA: Diagnosis not present

## 2015-10-16 DIAGNOSIS — Z01419 Encounter for gynecological examination (general) (routine) without abnormal findings: Secondary | ICD-10-CM

## 2015-10-16 DIAGNOSIS — Z0000 Encounter for general adult medical examination without abnormal findings: Secondary | ICD-10-CM | POA: Diagnosis not present

## 2015-10-16 NOTE — Progress Notes (Signed)
Subjective:     Briana Parks is a 50 y.o. female here for a routine exam.  Current complaints: None.  No regular exercise right now bc of foot problems.  Personal health questionnaire reviewed: no.   Gynecologic History No LMP recorded. Contraception: OCP (estrogen/progesterone) Last Pap: 09/12/14. Results were: normal Last mammogram: Never. Results were: N/A  BP (!) 126/56 (BP Location: Left Arm, Patient Position: Sitting, Cuff Size: Normal)   Pulse 86   Ht 5\' 6"  (1.676 m)   Wt 200 lb (90.7 kg)   SpO2 98%   BMI 32.28 kg/m     Allergies  Allergen Reactions  . Eggs Or Egg-Derived Products   . Sulfamethoxazole-Trimethoprim     REACTION: Hives  . Tdap [Diphth-Acell Pertussis-Tetanus] Other (See Comments)    Skin reaction with lymph node sweeling    Past Medical History:  Diagnosis Date  . ALLERGIC RHINITIS CAUSE UNSPECIFIED 08/03/2008   Qualifier: Diagnosis of  By: Thomos Lemons    . Headache 09/30/2013  . Obstructive sleep apnea 7cm water pressure CPAP 10/24/2010   Moderate. Sleep study was performed at [facility] neurological clinic by Dr. Marliss Coots on 10/02/2010. AHI was 19.8 with desaturations to 79%. Frequent severe snoring. Moderate wedge works associated with respiratory events. Difficulty initiating and maintaining sleep with no slow wave sleep. She had severe OSA and REM.   . Osteoarthritis of left knee 10/26/2013  . Rosacea 09/13/2010    Past Surgical History:  Procedure Laterality Date  . bone spur removal Right 02/17/2012   foot  . HERNIA REPAIR    . KNEE ARTHROSCOPY Left 03/16/14   [facility]   . tonsillectome and adnoidectomy    . TUBAL LIGATION      Social History   Social History  . Marital status: Married    Spouse name: Fayrene Fearing  . Number of children: 2  . Years of education: college   Occupational History  .  [employer]    [employer]   Social History Main Topics  . Smoking status: Never Smoker  . Smokeless tobacco:  Never Used  . Alcohol use 0.6 oz/week    1 Glasses of wine per week     Comment: rare  . Drug use: No  . Sexual activity: Not on file     Comment: cake decorator, 2 yrs college, married, 2 children, 4 sodas daily, no regular exercise   Other Topics Concern  . Not on file   Social History Narrative   Patient lives at home with her husband Fayrene Fearing)   Education two years of college education.   Patient works for [employer]. [employer]   Right handed.   Caffeine three cups of coke daily.          Family History  Problem Relation Age of Onset  . Arthritis Mother   . Fibromyalgia Mother   . Cancer Mother     liver small intestine carcinoid tumor    Outpatient Encounter Prescriptions as of 10/16/2015  Medication Sig  . AMBULATORY NON FORMULARY MEDICATION Medication Name: 7 cm water pressure CPAP, heated/humidified machine.   Dx : Severe OSA.  Marland Kitchen NORTREL 7/7/7 0.5/0.75/1-35 MG-MCG tablet TAKE 1 TABLET BY MOUTH DAILY.  Marland Kitchen propranolol (INDERAL) 20 MG tablet Take 1 tablet (20 mg total) by mouth daily.  . [DISCONTINUED] HYDROcodone-homatropine (HYCODAN) 5-1.5 MG/5ML syrup Take 5 mLs by mouth every 6 (six) hours as needed for cough.  . [DISCONTINUED] ipratropium (ATROVENT) 0.03 % nasal spray Place  2 sprays into both nostrils every 12 (twelve) hours.  . [DISCONTINUED] meloxicam (MOBIC) 15 MG tablet TAKE 1 TABLET BY MOUTH EVERY DAY AS NEEDED FOR PAIN (Patient not taking: Reported on 10/04/2015)   No facility-administered encounter medications on file as of 10/16/2015.        Obstetric History OB History  No data available     The following portions of the patient's history were reviewed and updated as appropriate: allergies, current medications, past family history, past medical history, past social history, past surgical history and problem list.  Review of Systems A comprehensive review of systems was negative.    Objective:    BP (!) 126/56 (BP Location: Left Arm, Patient  Position: Sitting, Cuff Size: Normal)   Pulse 86   Ht  (1.676 m)   Wt 200 lb (90.7 kg)   SpO2 98%   BMI 32.28 kg/m  General appearance: alert, cooperative and appears stated age Head: Normocephalic, without obvious abnormality, atraumatic Eyes: conj clear, EOMI, PEERLA Ears: normal TM's and external ear canals both ears Nose: Nares normal. Septum midline. Mucosa normal. No drainage or sinus tenderness. Throat: lips, mucosa, and tongue normal; teeth and gums normal Neck: no adenopathy, no carotid bruit, no JVD, supple, symmetrical, trachea midline and thyroid not enlarged, symmetric, no tenderness/mass/nodules Back: symmetric, no curvature. ROM normal. No CVA tenderness. Lungs: clear to auscultation bilaterally Breasts: normal appearance, no masses or tenderness Heart: regular rate and rhythm, S1, S2 normal, no murmur, click, rub or gallop Abdomen: soft, non-tender; bowel sounds normal; no masses,  no organomegaly Extremities: extremities normal, atraumatic, no cyanosis or edema Pulses: 2+ and symmetric Skin: Skin color, texture, turgor normal. No rashes or lesions Lymph nodes: Cervical, supraclavicular, and axillary nodes normal. Neurologic: Alert and oriented X 3, normal strength and tone. Normal symmetric reflexes. Normal coordination and gait    Assessment:    Healthy female exam.    Plan:    Contraception: OCP (estrogen/progesterone). Mammogram ordered.

## 2015-10-16 NOTE — Patient Instructions (Addendum)
Keep up a regular exercise program and make sure you are eating a healthy diet Try to eat 4 servings of dairy a day, or if you are lactose intolerant take a calcium with vitamin D daily.  Your vaccines are up to date.  Health Maintenance, Female Adopting a healthy lifestyle and getting preventive care can go a long way to promote health and wellness. Talk with your health care provider about what schedule of regular examinations is right for you. This is a good chance for you to check in with your provider about disease prevention and staying healthy. In between checkups, there are plenty of things you can do on your own. Experts have done a lot of research about which lifestyle changes and preventive measures are most likely to keep you healthy. Ask your health care provider for more information. WEIGHT AND DIET  Eat a healthy diet  Be sure to include plenty of vegetables, fruits, low-fat dairy products, and lean protein.  Do not eat a lot of foods high in solid fats, added sugars, or salt.  Get regular exercise. This is one of the most important things you can do for your health.  Most adults should exercise for at least 150 minutes each week. The exercise should increase your heart rate and make you sweat (moderate-intensity exercise).  Most adults should also do strengthening exercises at least twice a week. This is in addition to the moderate-intensity exercise.  Maintain a healthy weight  Body mass index (BMI) is a measurement that can be used to identify possible weight problems. It estimates body fat based on height and weight. Your health care provider can help determine your BMI and help you achieve or maintain a healthy weight.  For females 50 years of age and older:   A BMI below 18.5 is considered underweight.  A BMI of 18.5 to 24.9 is normal.  A BMI of 25 to 29.9 is considered overweight.  A BMI of 30 and above is considered obese.  Watch levels of cholesterol and  blood lipids  You should start having your blood tested for lipids and cholesterol at 48 years of age, then have this test every 5 years.  You may need to have your cholesterol levels checked more often if:  Your lipid or cholesterol levels are high.  You are older than 48 years of age.  You are at high risk for heart disease.  CANCER SCREENING   Lung Cancer  Lung cancer screening is recommended for adults 23-65 years old who are at high risk for lung cancer because of a history of smoking.  A yearly low-dose CT scan of the lungs is recommended for people who:  Currently smoke.  Have quit within the past 15 years.  Have at least a 30-pack-year history of smoking. A pack year is smoking an average of one pack of cigarettes a day for 1 year.  Yearly screening should continue until it has been 15 years since you quit.  Yearly screening should stop if you develop a health problem that would prevent you from having lung cancer treatment.  Breast Cancer  Practice breast self-awareness. This means understanding how your breasts normally appear and feel.  It also means doing regular breast self-exams. Let your health care provider know about any changes, no matter how small.  If you are in your 20s or 30s, you should have a clinical breast exam (CBE) by a health care provider every 1-3 years as part of a regular  health exam.  If you are 40 or older, have a CBE every year. Also consider having a breast X-ray (mammogram) every year.  If you have a family history of breast cancer, talk to your health care provider about genetic screening.  If you are at high risk for breast cancer, talk to your health care provider about having an MRI and a mammogram every year.  Breast cancer gene (BRCA) assessment is recommended for women who have family members with BRCA-related cancers. BRCA-related cancers include:  Breast.  Ovarian.  Tubal.  Peritoneal cancers.  Results of the  assessment will determine the need for genetic counseling and BRCA1 and BRCA2 testing. Cervical Cancer Your health care provider may recommend that you be screened regularly for cancer of the pelvic organs (ovaries, uterus, and vagina). This screening involves a pelvic examination, including checking for microscopic changes to the surface of your cervix (Pap test). You may be encouraged to have this screening done every 3 years, beginning at age 27.  For women ages 70-65, health care providers may recommend pelvic exams and Pap testing every 3 years, or they may recommend the Pap and pelvic exam, combined with testing for human papilloma virus (HPV), every 5 years. Some types of HPV increase your risk of cervical cancer. Testing for HPV may also be done on women of any age with unclear Pap test results.  Other health care providers may not recommend any screening for nonpregnant women who are considered low risk for pelvic cancer and who do not have symptoms. Ask your health care provider if a screening pelvic exam is right for you.  If you have had past treatment for cervical cancer or a condition that could lead to cancer, you need Pap tests and screening for cancer for at least 20 years after your treatment. If Pap tests have been discontinued, your risk factors (such as having a new sexual partner) need to be reassessed to determine if screening should resume. Some women have medical problems that increase the chance of getting cervical cancer. In these cases, your health care provider may recommend more frequent screening and Pap tests. Colorectal Cancer  This type of cancer can be detected and often prevented.  Routine colorectal cancer screening usually begins at 48 years of age and continues through 48 years of age.  Your health care provider may recommend screening at an earlier age if you have risk factors for colon cancer.  Your health care provider may also recommend using home test kits  to check for hidden blood in the stool.  A small camera at the end of a tube can be used to examine your colon directly (sigmoidoscopy or colonoscopy). This is done to check for the earliest forms of colorectal cancer.  Routine screening usually begins at age 78.  Direct examination of the colon should be repeated every 5-10 years through 48 years of age. However, you may need to be screened more often if early forms of precancerous polyps or small growths are found. Skin Cancer  Check your skin from head to toe regularly.  Tell your health care provider about any new moles or changes in moles, especially if there is a change in a mole's shape or color.  Also tell your health care provider if you have a mole that is larger than the size of a pencil eraser.  Always use sunscreen. Apply sunscreen liberally and repeatedly throughout the day.  Protect yourself by wearing long sleeves, pants, a wide-brimmed hat, and  sunglasses whenever you are outside. HEART DISEASE, DIABETES, AND HIGH BLOOD PRESSURE   High blood pressure causes heart disease and increases the risk of stroke. High blood pressure is more likely to develop in:  People who have blood pressure in the high end of the normal range (130-139/85-89 mm Hg).  People who are overweight or obese.  People who are African American.  If you are 71-77 years of age, have your blood pressure checked every 3-5 years. If you are 17 years of age or older, have your blood pressure checked every year. You should have your blood pressure measured twice--once when you are at a hospital or clinic, and once when you are not at a hospital or clinic. Record the average of the two measurements. To check your blood pressure when you are not at a hospital or clinic, you can use:  An automated blood pressure machine at a pharmacy.  A home blood pressure monitor.  If you are between 67 years and 74 years old, ask your health care provider if you should  take aspirin to prevent strokes.  Have regular diabetes screenings. This involves taking a blood sample to check your fasting blood sugar level.  If you are at a normal weight and have a low risk for diabetes, have this test once every three years after 48 years of age.  If you are overweight and have a high risk for diabetes, consider being tested at a younger age or more often. PREVENTING INFECTION  Hepatitis B  If you have a higher risk for hepatitis B, you should be screened for this virus. You are considered at high risk for hepatitis B if:  You were born in a country where hepatitis B is common. Ask your health care provider which countries are considered high risk.  Your parents were born in a high-risk country, and you have not been immunized against hepatitis B (hepatitis B vaccine).  You have HIV or AIDS.  You use needles to inject street drugs.  You live with someone who has hepatitis B.  You have had sex with someone who has hepatitis B.  You get hemodialysis treatment.  You take certain medicines for conditions, including cancer, organ transplantation, and autoimmune conditions. Hepatitis C  Blood testing is recommended for:  Everyone born from [birth year] through 1963.  Anyone with known risk factors for hepatitis C. Sexually transmitted infections (STIs)  You should be screened for sexually transmitted infections (STIs) including gonorrhea and chlamydia if:  You are sexually active and are younger than 48 years of age.  You are older than 48 years of age and your health care provider tells you that you are at risk for this type of infection.  Your sexual activity has changed since you were last screened and you are at an increased risk for chlamydia or gonorrhea. Ask your health care provider if you are at risk.  If you do not have HIV, but are at risk, it may be recommended that you take a prescription medicine daily to prevent HIV infection. This is called  pre-exposure prophylaxis (PrEP). You are considered at risk if:  You are sexually active and do not regularly use condoms or know the HIV status of your partner(s).  You take drugs by injection.  You are sexually active with a partner who has HIV. Talk with your health care provider about whether you are at high risk of being infected with HIV. If you choose to begin PrEP, you should first  be tested for HIV. You should then be tested every 3 months for as long as you are taking PrEP.  PREGNANCY   If you are premenopausal and you may become pregnant, ask your health care provider about preconception counseling.  If you may become pregnant, take 400 to 800 micrograms (mcg) of folic acid every day.  If you want to prevent pregnancy, talk to your health care provider about birth control (contraception). OSTEOPOROSIS AND MENOPAUSE   Osteoporosis is a disease in which the bones lose minerals and strength with aging. This can result in serious bone fractures. Your risk for osteoporosis can be identified using a bone density scan.  If you are 52 years of age or older, or if you are at risk for osteoporosis and fractures, ask your health care provider if you should be screened.  Ask your health care provider whether you should take a calcium or vitamin D supplement to lower your risk for osteoporosis.  Menopause may have certain physical symptoms and risks.  Hormone replacement therapy may reduce some of these symptoms and risks. Talk to your health care provider about whether hormone replacement therapy is right for you.  HOME CARE INSTRUCTIONS   Schedule regular health, dental, and eye exams.  Stay current with your immunizations.   Do not use any tobacco products including cigarettes, chewing tobacco, or electronic cigarettes.  If you are pregnant, do not drink alcohol.  If you are breastfeeding, limit how much and how often you drink alcohol.  Limit alcohol intake to no more than 1  drink per day for nonpregnant women. One drink equals 12 ounces of beer, 5 ounces of wine, or 1 ounces of hard liquor.  Do not use street drugs.  Do not share needles.  Ask your health care provider for help if you need support or information about quitting drugs.  Tell your health care provider if you often feel depressed.  Tell your health care provider if you have ever been abused or do not feel safe at home.   This information is not intended to replace advice given to you by your health care provider. Make sure you discuss any questions you have with your health care provider.   Document Released: 09/24/2010 Document Revised: 04/01/2014 Document Reviewed: 02/10/2013 Elsevier Interactive Patient Education 2016 [place] premenopausal annual exam patient instructions here.

## 2015-10-17 ENCOUNTER — Ambulatory Visit (INDEPENDENT_AMBULATORY_CARE_PROVIDER_SITE_OTHER): Payer: BC Managed Care – PPO | Admitting: Physical Therapy

## 2015-10-17 DIAGNOSIS — M6281 Muscle weakness (generalized): Secondary | ICD-10-CM

## 2015-10-17 DIAGNOSIS — M25572 Pain in left ankle and joints of left foot: Secondary | ICD-10-CM

## 2015-10-17 DIAGNOSIS — R2689 Other abnormalities of gait and mobility: Secondary | ICD-10-CM | POA: Diagnosis not present

## 2015-10-17 DIAGNOSIS — M25672 Stiffness of left ankle, not elsewhere classified: Secondary | ICD-10-CM

## 2015-10-17 NOTE — Therapy (Signed)
[facility] [address] [place], [place], [address] Phone: [phone]   Fax:  [phone]  Physical Therapy Treatment  Patient Details  Name: Briana Parks MRN: [id number] Date of Birth: May 04, 1965 Referring Provider: Dr. Yates Decamp  Encounter Date: 10/17/2015      PT End of Session - 10/17/15 0938    Visit Number 4   Number of Visits 12   Date for PT Re-Evaluation 11/15/15   PT Start Time 0934   PT Stop Time 1032   PT Time Calculation (min) 58 min   Activity Tolerance Patient tolerated treatment well;No increased pain      Past Medical History:  Diagnosis Date  . ALLERGIC RHINITIS CAUSE UNSPECIFIED 08/03/2008   Qualifier: Diagnosis of  By: Thomos Lemons    . Headache 09/30/2013  . Obstructive sleep apnea 7cm water pressure CPAP 10/24/2010   Moderate. Sleep study was performed at [facility] neurological clinic by Dr. Marliss Coots on 10/02/2010. AHI was 19.8 with desaturations to 79%. Frequent severe snoring. Moderate wedge works associated with respiratory events. Difficulty initiating and maintaining sleep with no slow wave sleep. She had severe OSA and REM.   . Osteoarthritis of left knee 10/26/2013  . Rosacea 09/13/2010    Past Surgical History:  Procedure Laterality Date  . bone spur removal Right 02/17/2012   foot  . HERNIA REPAIR    . KNEE ARTHROSCOPY Left 03/16/14   Dr. [facility]   . tonsillectome and adnoidectomy    . TUBAL LIGATION      There were no vitals filed for this visit.      Subjective Assessment - 10/17/15 0938    Subjective Pt reports she hasn't worn boot in 5 days.  No increased pain without it.  "Still taking it easy", elevating and icing 2x/day.     Currently in Pain? Yes   Pain Score 1    Pain Location Heel   Pain Orientation Left;Posterior   Pain Descriptors / Indicators Sharp   Aggravating Factors  WB   Pain Relieving Factors ice, elevation              [facility] PT Assessment - 10/17/15 0001      Assessment   Medical Diagnosis post op Lt heel spur resection   Referring Provider Dr. Yates Decamp   Onset Date/Surgical Date 09/18/15   Next MD Visit 11/01/15   Prior Therapy not for this foot     AROM   Right/Left Ankle Left   Left Ankle Dorsiflexion 5     PROM   PROM Assessment Site Ankle   Right/Left Ankle Left   Left Ankle Dorsiflexion 10            [facility] Adult PT Treatment/Exercise - 10/17/15 0001      Exercises   Exercises Ankle;Knee/Hip     Knee/Hip Exercises: Stretches   Gastroc Stretch Right;Left;2 reps;30 seconds   Soleus Stretch Right;Left;2 reps;30 seconds     Ultrasound   Ultrasound Location Lt heel   Ultrasound Parameters 50%, 3.3 mHz, 0.9 w/cm2, 8 min    Ultrasound Goals --  scar break up, pain     Vasopneumatic   Number Minutes Vasopneumatic  15 minutes   Vasopnuematic Location  Ankle   Vasopneumatic Pressure Medium   Vasopneumatic Temperature  3*     Ankle Exercises: Aerobic   Stationary Bike L2: 5 min      Ankle Exercises: Standing   SLS Lt SLS x 15 sec x  3; repeated on blue pad.     Other Standing Ankle Exercises tandem walking 25 ft x 3.       Ankle Exercises: Supine   T-Band Lt ankle DF x 10 with red, repeated with green band.  Inversion/ eversion x 10 reps x 2 sets with green band                 PT Education - 10/17/15 1019    Education provided Yes   Education Details HEP    Person(s) Educated Patient   Methods Handout;Explanation;Demonstration   Comprehension Verbalized understanding;Returned demonstration             PT Long Term Goals - 10/13/15 1610      PT LONG TERM GOAL #1   Title I with advanced HEP ( 11/15/15)    Time 6   Period Weeks   Status On-going     PT LONG TERM GOAL #2   Title increase Lt ankle dorsiflexion =/> 15 degrees ( 11/15/15)    Time 6   Period Weeks   Status On-going     PT LONG TERM GOAL #3   Title demo Rt ankle/knee strength =/> 5-/5 (  11/15/15)    Time 6   Period Weeks   Status On-going     PT LONG TERM GOAL #4   Title ambulate without CAM boot and normalized gait ( 11/15/15)    Time 6   Period Weeks   Status On-going     PT LONG TERM GOAL #5   Title improve FOTO =/< 39% limited ( 11/15/15)    Time 6   Period Weeks   Status On-going               Plan - 10/17/15 1308    Clinical Impression Statement Pt tolerated new exercises well, with minimal increase in pain.  Pt demonstrated improved Lt ankle ROM.  Progressing well towards established goals.    Rehab Potential Good   PT Frequency 2x / week   PT Duration 6 weeks   PT Treatment/Interventions Ultrasound;Neuromuscular re-education;Passive range of motion;Patient/family education;Gait training;Electrical Stimulation;Iontophoresis /ml Dexamethasone;Cryotherapy;Moist Heat;Therapeutic exercise;Manual techniques;Vasopneumatic Device;Taping;Dry needling   PT Next Visit Plan [place] and manual therapy to break up scar tissue, ankle ROM, strengthening and proprioception    Consulted and Agree with Plan of Care Patient      Patient will benefit from skilled therapeutic intervention in order to improve the following deficits and impairments:  Increased edema, Decreased strength, Pain, Decreased scar mobility, Decreased range of motion, Difficulty walking  Visit Diagnosis: Pain in left ankle and joints of left foot  Stiffness of left ankle, not elsewhere classified  Muscle weakness (generalized)  Other abnormalities of gait and mobility     Problem List Patient Active Problem List   Diagnosis Date Noted  . Abnormal MRI of head 10/03/2015  . Osteoarthritis of left knee 10/26/2013  . Headache 09/30/2013  . Obstructive sleep apnea 7cm water pressure CPAP 10/24/2010  . Rosacea 09/13/2010  . ALLERGIC RHINITIS CAUSE UNSPECIFIED 08/03/2008  . SPONDYLOLISTHESIS, ACQUIRED 05/21/2006  . SCOLIOSIS 12/31/2005  . SHOULDER/ARM SPRAIN/STRAIN, UNSPEC. 12/31/2005    Mayer Camel, PTA 10/17/15 1:09 PM  [facility] [place] [address] [place], [place], [address] Phone: [phone]   Fax:  [phone]  Name: Briana Parks MRN: [id number] Date of Birth: 1965-11-23

## 2015-10-17 NOTE — Patient Instructions (Signed)
Inversion: Resisted   Cross legs with right leg underneath, foot in tubing loop. Hold tubing around other foot to resist and turn foot in. Repeat __10__ times per set. Do __2__ sets per session. Do __1__ sessions per day.   Copyright  VHI. All rights reserved.  Eversion: Resisted   With right foot in tubing loop, hold tubing around other foot to resist and turn foot out. Repeat __10__ times per set. Do __2__ sets per session. Do __1__ sessions per day.    Dorsiflexion: Resisted   Facing anchor, tubing around left foot, pull toward face.  Repeat __10__ times per set. Do __2__ sets per session. Do ___1_ sessions per day.

## 2015-10-20 ENCOUNTER — Encounter: Payer: Self-pay | Admitting: Rehabilitative and Restorative Service Providers"

## 2015-10-20 ENCOUNTER — Ambulatory Visit (INDEPENDENT_AMBULATORY_CARE_PROVIDER_SITE_OTHER): Payer: BC Managed Care – PPO | Admitting: Rehabilitative and Restorative Service Providers"

## 2015-10-20 DIAGNOSIS — R2689 Other abnormalities of gait and mobility: Secondary | ICD-10-CM | POA: Diagnosis not present

## 2015-10-20 DIAGNOSIS — M25572 Pain in left ankle and joints of left foot: Secondary | ICD-10-CM | POA: Diagnosis not present

## 2015-10-20 DIAGNOSIS — M25672 Stiffness of left ankle, not elsewhere classified: Secondary | ICD-10-CM

## 2015-10-20 DIAGNOSIS — M6281 Muscle weakness (generalized): Secondary | ICD-10-CM | POA: Diagnosis not present

## 2015-10-20 NOTE — Therapy (Signed)
[facility] [place] [address] [place], [place], [address] Phone: [phone]   Fax:  [phone]  Physical Therapy Treatment  Patient Details  Name: Briana Parks MRN: [id number] Date of Birth: 10/16/1965 Referring Provider: Dr. Yates Decamp  Encounter Date: 10/20/2015      Parks End of Session - 10/20/15 1026    Visit Number 5   Number of Visits 12   Date for Parks Re-Evaluation 11/15/15   Parks Start Time 1005   Parks Stop Time 1055   Parks Time Calculation (min) 50 min   Activity Tolerance Patient tolerated treatment well      Past Medical History:  Diagnosis Date  . ALLERGIC RHINITIS CAUSE UNSPECIFIED 08/03/2008   Qualifier: Diagnosis of  By: Thomos Lemons    . Headache 09/30/2013  . Obstructive sleep apnea 7cm water pressure CPAP 10/24/2010   Moderate. Sleep study was performed at [facility] neurological clinic by Dr. Marliss Coots on 10/02/2010. AHI was 19.8 with desaturations to 79%. Frequent severe snoring. Moderate wedge works associated with respiratory events. Difficulty initiating and maintaining sleep with no slow wave sleep. She had severe OSA and REM.   . Osteoarthritis of left knee 10/26/2013  . Rosacea 09/13/2010    Past Surgical History:  Procedure Laterality Date  . bone spur removal Right 02/17/2012   foot  . HERNIA REPAIR    . KNEE ARTHROSCOPY Left 03/16/14   [facility]   . tonsillectome and adnoidectomy    . TUBAL LIGATION      There were no vitals filed for this visit.      Subjective Assessment - 10/20/15 1024    Subjective On her feet for several hours yesterday and has increased pain and swelling today. Unable to perform full exercise program today.    Currently in Pain? Yes   Pain Score 4    Pain Location Heel   Pain Orientation Left;Posterior   Pain Descriptors / Indicators Tightness;Aching   Pain Type Surgical pain   Pain Onset 1 to 4 weeks ago   Pain Frequency Constant                          [facility] Adult Parks Treatment/Exercise - 10/20/15 0001      Knee/Hip Exercises: Stretches   Gastroc Stretch Right;Left;2 reps;30 seconds   Soleus Stretch Right;Left;2 reps;30 seconds     Ultrasound   Ultrasound Parameters 50%; 3.3 mHz; 1.0 w/cm2; 8 min    Ultrasound Goals --  scar break up, pain     Iontophoresis   Type of Iontophoresis Dexamethasone   Location plantar surface   Dose 120 mAmp   Time 8 hours      Vasopneumatic   Vasopnuematic Location  Ankle   Vasopneumatic Pressure Medium   Vasopneumatic Temperature  3*     Manual Therapy   Manual Therapy Soft tissue mobilization;Other (comment)   Soft tissue mobilization working through [employer] plantar fascia, calf   Other Manual Therapy ice massage to Lt heel incision - 3 min      Ankle Exercises: Stretches   Soleus Stretch 2 reps;30 seconds   Gastroc Stretch 2 reps;30 seconds     Ankle Exercises: Aerobic   Stationary Bike Nu step L4 5 min      Ankle Exercises: Standing   SLS Lt SLS x 15 sec x 3; repeated on blue pad.  Parks Long Term Goals - 10/20/15 1038      Parks LONG TERM GOAL #1   Title I with advanced HEP ( 11/15/15)    Time 6   Period Weeks   Status On-going     Parks LONG TERM GOAL #2   Title increase Lt ankle dorsiflexion =/> 15 degrees ( 11/15/15)    Time 6   Period Weeks   Status On-going     Parks LONG TERM GOAL #3   Title demo Rt ankle/knee strength =/> 5-/5 ( 11/15/15)    Time 6   Period Weeks   Status On-going     Parks LONG TERM GOAL #4   Title ambulate without CAM boot and normalized gait ( 11/15/15)    Time 6   Period Weeks   Status Partially Met     Parks LONG TERM GOAL #5   Title improve FOTO =/< 39% limited ( 11/15/15)    Time 6   Period Weeks   Status On-going               Plan - 10/20/15 1036    Clinical Impression Statement Flare up of ankle/foot/heel pain today related to being on her feet for several hours  yesterday. Increased pain and edema. Focused on pain and edema management today.    Rehab Potential Good   Parks Frequency 2x / week   Parks Duration 6 weeks   Parks Treatment/Interventions Ultrasound;Neuromuscular re-education;Passive range of motion;Patient/family education;Gait training;Electrical Stimulation;Iontophoresis 4mg /ml Dexamethasone;Cryotherapy;Moist Heat;Therapeutic exercise;Manual techniques;Vasopneumatic Device;Taping;Dry needling   Parks Next Visit Plan [place] and manual therapy to break up scar tissue, ankle ROM, strengthening and proprioception; trial of ionto for area of pain and irritation   Consulted and Agree with Plan of Care Patient      Patient will benefit from skilled therapeutic intervention in order to improve the following deficits and impairments:  Increased edema, Decreased strength, Pain, Decreased scar mobility, Decreased range of motion, Difficulty walking  Visit Diagnosis: Pain in left ankle and joints of left foot  Stiffness of left ankle, not elsewhere classified  Muscle weakness (generalized)  Other abnormalities of gait and mobility     Problem List Patient Active Problem List   Diagnosis Date Noted  . Abnormal MRI of head 10/03/2015  . Osteoarthritis of left knee 10/26/2013  . Headache 09/30/2013  . Obstructive sleep apnea 7cm water pressure CPAP 10/24/2010  . Rosacea 09/13/2010  . ALLERGIC RHINITIS CAUSE UNSPECIFIED 08/03/2008  . SPONDYLOLISTHESIS, ACQUIRED 05/21/2006  . SCOLIOSIS 12/31/2005  . SHOULDER/ARM SPRAIN/STRAIN, UNSPEC. 12/31/2005    Briana Parks, MPH  10/20/2015, 10:41 AM  [facility] [address] [place], [place], [address] Phone: [phone]   Fax:  [phone]  Name: Briana Parks MRN: [id number] Date of Birth: 1965/07/31

## 2015-10-23 ENCOUNTER — Ambulatory Visit (INDEPENDENT_AMBULATORY_CARE_PROVIDER_SITE_OTHER): Payer: BC Managed Care – PPO

## 2015-10-23 DIAGNOSIS — R930 Abnormal findings on diagnostic imaging of skull and head, not elsewhere classified: Secondary | ICD-10-CM | POA: Diagnosis not present

## 2015-10-23 DIAGNOSIS — R519 Headache, unspecified: Secondary | ICD-10-CM

## 2015-10-23 DIAGNOSIS — R51 Headache: Secondary | ICD-10-CM

## 2015-10-24 ENCOUNTER — Telehealth: Payer: Self-pay | Admitting: *Deleted

## 2015-10-24 NOTE — Telephone Encounter (Signed)
Spoke to pt and relayed that MRI unchanged from 2015.  She verbalized understanding.

## 2015-10-24 NOTE — Telephone Encounter (Signed)
-----   Message from Nilda Riggs, NP sent at 10/23/2015 12:16 PM EDT ----- MRI of the brain with unchanged enlargement of the right ventricle when compared to 2015.  No new abnormality. Please call the patient

## 2015-10-25 ENCOUNTER — Ambulatory Visit (INDEPENDENT_AMBULATORY_CARE_PROVIDER_SITE_OTHER): Payer: BC Managed Care – PPO | Admitting: Physical Therapy

## 2015-10-25 ENCOUNTER — Ambulatory Visit (INDEPENDENT_AMBULATORY_CARE_PROVIDER_SITE_OTHER): Payer: BC Managed Care – PPO

## 2015-10-25 DIAGNOSIS — R2689 Other abnormalities of gait and mobility: Secondary | ICD-10-CM | POA: Diagnosis not present

## 2015-10-25 DIAGNOSIS — M6281 Muscle weakness (generalized): Secondary | ICD-10-CM | POA: Diagnosis not present

## 2015-10-25 DIAGNOSIS — M25572 Pain in left ankle and joints of left foot: Secondary | ICD-10-CM

## 2015-10-25 DIAGNOSIS — Z1231 Encounter for screening mammogram for malignant neoplasm of breast: Secondary | ICD-10-CM | POA: Diagnosis not present

## 2015-10-25 DIAGNOSIS — M25672 Stiffness of left ankle, not elsewhere classified: Secondary | ICD-10-CM | POA: Diagnosis not present

## 2015-10-25 NOTE — Therapy (Addendum)
[place], [place], [address] Phone: [phone]   Fax:  [phone]  Physical Therapy Treatment  Patient Details  Name: Briana Parks MRN: [id number] Date of Birth: [birth year] Referring Provider: Dr. Wardell Honour   Encounter Date: 10/25/2015      PT End of Session - 10/25/15 1604    Visit Number 6   Number of Visits 12   Date for PT Re-Evaluation 11/15/15   PT Start Time 1518   PT Stop Time 2774   PT Time Calculation (min) 46 min      Past Medical History:  Diagnosis Date  . ALLERGIC RHINITIS CAUSE UNSPECIFIED 08/03/2008   Qualifier: Diagnosis of  By: Esmeralda Arthur    . Headache 09/30/2013  . Obstructive sleep apnea 7cm water pressure CPAP 10/24/2010   Moderate. Sleep study was performed at [facility] neurological clinic by Dr. Pecolia Ades on 10/02/2010. AHI was 19.8 with desaturations to 79%. Frequent severe snoring. Moderate wedge works associated with respiratory events. Difficulty initiating and maintaining sleep with no slow wave sleep. She had severe OSA and REM.   . Osteoarthritis of left knee 10/26/2013  . Rosacea 09/13/2010    Past Surgical History:  Procedure Laterality Date  . bone spur removal Right 02/17/2012   foot  . HERNIA REPAIR    . KNEE ARTHROSCOPY Left 03/16/14   [facility]   . tonsillectome and adnoidectomy    . TUBAL LIGATION      There were no vitals filed for this visit.      Subjective Assessment - 10/25/15 1526    Subjective "This may be my visit for a while until I can figure out my finances".   Pt unsure if ionto patch helped; had minimal skin irritation from patch.  She reports no new changes since last visit.    Patient Stated Goals work out the pain and get his function get rid of scar tissue.  Ready to return to work on 11/09/15   Currently in Pain? No/denies            [facility] PT Assessment - 10/25/15 0001      Assessment   Medical  Diagnosis post op Lt heel spur resection   Referring Provider Dr. Wardell Honour    Onset Date/Surgical Date 09/18/15   Next MD Visit 11/01/15   Prior Therapy not for this foot     AROM   Right/Left Ankle Left   Left Ankle Dorsiflexion 8     Strength   Left Ankle Dorsiflexion 5/5   Left Ankle Inversion 5/5   Left Ankle Eversion 5/5          [facility] Adult PT Treatment/Exercise - 10/25/15 0001      Knee/Hip Exercises: Stretches   Gastroc Stretch Right;Left;2 reps;30 seconds   Soleus Stretch Right;Left;2 reps;30 seconds   Other Knee/Hip Stretches plantar fascia stretch 30 sec x 2      Knee/Hip Exercises: Aerobic   Nustep L5: 6 min      Knee/Hip Exercises: Standing   Step Down Right;1 set;10 reps;Hand Hold: 1;Step Height: 6"     Ultrasound   Ultrasound Location Lt heel    Ultrasound Parameters 50%, 3.3 mHz, 1.0 w/cm2 x 8 min    Ultrasound Goals --  scar break up, pain     Iontophoresis   Type of Iontophoresis Dexamethasone   Location plantar surface   Dose 120 mAmp   Time 8 hours  Ankle Exercises: Standing   BAPS Standing;Level 3;10 reps   x10 reps 12/6, 2 reps CW. unable to perform 3/9, CCW.    SLS Lt SLS  On Bosu (too difficult) - switched to 30 sec on blue pad.     Heel Raises 10 reps  toes in, out, straight (10 each)          PT Long Term Goals - 10/20/15 1038      PT LONG TERM GOAL #1   Title I with advanced HEP ( 11/15/15)    Time 6   Period Weeks   Status On-going     PT LONG TERM GOAL #2   Title increase Lt ankle dorsiflexion =/> 15 degrees ( 11/15/15)    Time 6   Period Weeks   Status On-going     PT LONG TERM GOAL #3   Title demo Rt ankle/knee strength =/> 5-/5 ( 11/15/15)    Time 6   Period Weeks   Status On-going     PT LONG TERM GOAL #4   Title ambulate without CAM boot and normalized gait ( 11/15/15)    Time 6   Period Weeks   Status Partially Met     PT LONG TERM GOAL #5   Title improve FOTO =/< 39% limited ( 11/15/15)    Time 6    Period Weeks   Status On-going               Plan - 10/25/15 1656    Clinical Impression Statement Pt no longer experiencing flare of heel pain, however she continues to be point tender at incision. Pt had some difficulty with BAPS board today in eversion directions due to strength. No increase in pain during exercises.   Pt making great gains towards unmet goals.  Finances are a concern, so remaining visits may be limited.    Rehab Potential Good   PT Frequency 2x / week   PT Duration 6 weeks   PT Treatment/Interventions Ultrasound;Neuromuscular re-education;Passive range of motion;Patient/family education;Gait training;Electrical Stimulation;Iontophoresis '4mg'$ /ml Dexamethasone;Cryotherapy;Moist Heat;Therapeutic exercise;Manual techniques;Vasopneumatic Device;Taping;Dry needling   PT Next Visit Plan Assess goals and response to 2nd ionto patch.  Progress HEP as tolerated.    Consulted and Agree with Plan of Care Patient      Patient will benefit from skilled therapeutic intervention in order to improve the following deficits and impairments:  Increased edema, Decreased strength, Pain, Decreased scar mobility, Decreased range of motion, Difficulty walking  Visit Diagnosis: Pain in left ankle and joints of left foot  Stiffness of left ankle, not elsewhere classified  Muscle weakness (generalized)  Other abnormalities of gait and mobility     Problem List Patient Active Problem List   Diagnosis Date Noted  . Abnormal MRI of head 10/03/2015  . Osteoarthritis of left knee 10/26/2013  . Headache 09/30/2013  . Obstructive sleep apnea 7cm water pressure CPAP 10/24/2010  . Rosacea 09/13/2010  . ALLERGIC RHINITIS CAUSE UNSPECIFIED 08/03/2008  . SPONDYLOLISTHESIS, ACQUIRED 05/21/2006  . SCOLIOSIS 12/31/2005  . SHOULDER/ARM SPRAIN/STRAIN, UNSPEC. 12/31/2005   Kerin Perna, PTA 10/25/15 5:04 PM  [place] [address] [place], [place], [address] Phone: [phone]   Fax:  [phone]  Name: Briana Parks MRN: [id number] Date of Birth: Jan 15, 1966  PHYSICAL THERAPY DISCHARGE SUMMARY  Visits from Start of Care: 6  Current functional level related to goals / functional outcomes: unknown   Remaining deficits: unknown   Education / Equipment: HEP  Plan: Patient agrees to discharge.  Patient goals were partially met. Patient is being discharged due to financial reasons.  ?????    Jeral Pinch, PT 02/19/16 8:29 AM

## 2016-04-12 ENCOUNTER — Other Ambulatory Visit: Payer: Self-pay | Admitting: Family Medicine

## 2016-09-27 ENCOUNTER — Other Ambulatory Visit: Payer: Self-pay | Admitting: Family Medicine

## 2016-10-02 ENCOUNTER — Encounter: Payer: Self-pay | Admitting: Nurse Practitioner

## 2016-10-02 ENCOUNTER — Ambulatory Visit (INDEPENDENT_AMBULATORY_CARE_PROVIDER_SITE_OTHER): Payer: BC Managed Care – PPO | Admitting: Nurse Practitioner

## 2016-10-02 VITALS — BP 127/73 | HR 67 | Wt 203.6 lb

## 2016-10-02 DIAGNOSIS — R51 Headache: Secondary | ICD-10-CM

## 2016-10-02 DIAGNOSIS — R519 Headache, unspecified: Secondary | ICD-10-CM

## 2016-10-02 DIAGNOSIS — R930 Abnormal findings on diagnostic imaging of skull and head, not elsewhere classified: Secondary | ICD-10-CM

## 2016-10-02 MED ORDER — PROPRANOLOL HCL 20 MG PO TABS: 20.0000 mg | ORAL_TABLET | Freq: Every day | ORAL | 3 refills | Status: DC

## 2016-10-02 NOTE — Progress Notes (Signed)
[facility]  PATIENT: Briana Parks DOB: [birth year]   REASON FOR VISIT: Follow-up for episodic headache, abnormal MRI of the brain HISTORY FROM: Patient    HISTORY OF PRESENT ILLNESS: Briana Parks is a 51 year old right-handed Caucasian female, accompanied by her husband, referred by the primary care physician Dr. Eppie GibsonMetheny for evaluation of headaches, review of her MRI She had a past medical history of occasionally headaches, bilateral frontal, pressure, couple times a month, was no light noise sensitivity, Tylenol or Advil has been very helpful, trigger for her headaches are usually [place], allergies, Over the past 2 years, she also developed postcoital headaches, and this headache is located at the vertex region, severe, pounding, as if her head is going to explode, she usually goes to sleep afterwards, wake up will be much better, there was no focal deficit during the episodes, it happened about 50% to time. For that reason, she received [facility], we have reviewed the films together,The right lateral ventricle is larger than the left without corresponding encephalomalacia. 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  Otherwise unremarkable MRI of the brain.  Unremarkable MRA of the intracranial circulation. Specifically, no evidence for intracranial aneurysm. She was given indomethacin 25 mg as needed for headaches, was not sure it was helpful She has history of one grand mal seizure in 1994, had MRI of the brain in [place] at that time, was told she had "a black spot in the brain, likely due to previous trauma." She was getting prescription of Tegretol for a while, no recurrent seizure  UPDATE 7/11/16[name], 51 year old female returns for follow-up. She is only taking her Inderal when necessary and has maybe 5 headaches a month. She is not  aware of any food triggers. She continues to have postcoital headaches. She has rosacea on her face and says she has been in several research trials without much benefit from medication. MRI of the brain 09/17/2013 shows enlarged right ventricle chronic changes no evidence of encephalomalacia. She returns for reevaluation UPDATE 10/03/15 Briana Parks, 51 year old female returns for follow-up. She has a history of episodic headaches which are much less severe since she has been on Inderal. She had a heel spur surgery the last of  June and is wearing a boot to the left lower leg. She is touchdown weightbearing. She begins her physical therapy tomorrow. MRI of the brain in 2015 showed enlarged right ventricle chronic changes. She is due for repeat MRI. She returns for reevaluation  UPDATE 07/11/2018CM Briana Parks, 51 year old female returns for follow-up with a history of episodic headaches. She may have 1-3 headaches per month at the most. She is currently on propanolol 20 mg daily with good benefit. MRI of the brain after her last visit on 10/23/2015 with unchanged enlargement of the right lateral ventricle and no new abnormality. She continues to work full-time and has not missed any work due to her headaches. She also has a history of obstructive sleep apnea and uses CPAP. She returns for reevaluation REVIEW OF SYSTEMS: Full 14 system review of systems performed and notable only for those listed, all others are neg:  Constitutional: neg  Cardiovascular: neg Ear/Nose/Throat: neg  Skin: neg Eyes: neg Respiratory: neg Gastroitestinal: neg  Hematology/Lymphatic: neg  Endocrine: neg Musculoskeletal: Left foot joint pain Allergy/Immunology: neg Neurological: neg Psychiatric: neg Sleep : Obstructive sleep apnea with CPAP  ALLERGIES: Allergies  Allergen Reactions  . Eggs Or Egg-Derived Products   . Sulfamethoxazole-Trimethoprim     REACTION: Hives  . Tdap [Diphth-Acell Pertussis-Tetanus] Other  (See Comments)    Skin reaction with lymph node sweeling    HOME MEDICATIONS: Outpatient Medications Prior to Visit  Medication Sig Dispense Refill  . AMBULATORY NON FORMULARY MEDICATION Medication Name: 7 Briana water pressure CPAP, heated/humidified machine.   Dx : Severe OSA. 1 Units 0  . NORTREL 7/7/7 0.5/0.75/1-35 MG-MCG tablet TAKE 1 TABLET BY MOUTH DAILY. 28 tablet 5  . propranolol (INDERAL) 20 MG tablet Take 1 tablet (20 mg total) by mouth daily. 30 tablet 11   No facility-administered medications prior to visit.     PAST MEDICAL HISTORY: Past Medical History:  Diagnosis Date  . ALLERGIC RHINITIS CAUSE UNSPECIFIED 08/03/2008   Qualifier: Diagnosis of  By: Thomos Lemons    . Headache 09/30/2013  . Obstructive sleep apnea 7cm water pressure CPAP 10/24/2010   Moderate. Sleep study was performed at [facility] neurological clinic by Dr. Marliss Coots on 10/02/2010. AHI was 19.8 with desaturations to 79%. Frequent severe snoring. Moderate wedge works associated with respiratory events. Difficulty initiating and maintaining sleep with no slow wave sleep. She had severe OSA and REM.   . Osteoarthritis of left knee 10/26/2013  . Rosacea 09/13/2010    PAST SURGICAL HISTORY: Past Surgical History:  Procedure Laterality Date  . bone spur removal Right 02/17/2012   foot  . bone spur removal  08/2015  . HERNIA REPAIR    . KNEE ARTHROSCOPY Left 03/16/14   [facility]   . tonsillectome and adnoidectomy    . TUBAL LIGATION      FAMILY HISTORY: Family History  Problem Relation Age of Onset  . Arthritis Mother   . Fibromyalgia Mother   . Cancer Mother        liver small intestine carcinoid tumor    SOCIAL HISTORY: Social History   Social History  . Marital status: Married    Spouse name: Fayrene Fearing  . Number of children: 2  . Years of education: college   Occupational History  .  [employer]    [employer]   Social History Main Topics  . Smoking status: Never  Smoker  . Smokeless tobacco: Never Used  . Alcohol use 0.6 oz/week    1 Glasses of wine per week     Comment: rare  . Drug use: No  . Sexual activity: Not on file     Comment: cake decorator, 2 yrs college, married, 2 children, 4 sodas daily, no regular exercise   Other Topics Concern  . Not on file   Social History Narrative   Patient lives at home with her husband Fayrene Fearing)   Education two years of college education.   Patient works for [employer]. [employer]   Right handed.   Caffeine three cups of coke daily.           PHYSICAL EXAM  Vitals:   10/02/16 0901  BP: 127/73  Pulse: 67  Weight: 203 lb 9.6 oz (92.4 kg)   Body mass index is 32.86 kg/m. Generalized: In no acute distress, obese female Neck: Supple,  Cardiac: Regular rate rhythm Skin rosacea on her face Musculoskeletal: No deformity  Neurological examination Mentation: Alert oriented to time, place, history taking, and causual conversation Cranial nerve II-XII: Pupils were equal round reactive to light. Extraocular movements were full. Visual field were full on confrontational test. Bilateral fundi  were sharp. Facial sensation and strength were normal. Hearing was intact to finger rubbing bilaterally. Uvula tongue midline. Head turning and shoulder shrug and were normal and symmetric.Tongue protrusion into cheek strength was normal. Motor: Normal tone, bulk and strength. Sensory: Intact to fine touch,  Coordination: Normal finger to nose, heel-to-shin bilaterally Gait: Rising up from seated position without assistance, normal stance, without trunk ataxia, moderate stride, good arm swing, smooth turning, tandem gait is steady  DIAGNOSTIC DATA (LABS, IMAGING, TESTING) -  MRI of the brain after her last visit on 10/23/2015 with unchanged enlargement of the right lateral ventricle and no new abnormality.  ASSESSMENT AND PLAN 51 y.o. year old female has a past medical history of postcoital headaches and  normal neurologic exam. MRI of the brain 10/23/2015 with unchanged enlargement of the right lateral ventricle and no new abnormality.The patient is a current patient of Dr. Terrace Arabia who is out of the office today . This note is sent to the work in doctor.      Continue Inderal 20mg  daily will refill Call for increase in headaches Follow up in 1 year Nilda Riggs, [facility], [facility], APRN  [facility] [address] [place], [place] [address] [phone]

## 2016-10-02 NOTE — Patient Instructions (Signed)
Continue Inderal 20mg  daily will refill Call for increase in headaches Follow up in 1 year

## 2016-10-03 NOTE — Progress Notes (Signed)
I agree with the assessment and plan as directed by NP .The patient is known to me .   Krisinda Giovanni, MD  

## 2016-10-08 ENCOUNTER — Encounter: Payer: Self-pay | Admitting: Family Medicine

## 2016-10-08 ENCOUNTER — Ambulatory Visit (INDEPENDENT_AMBULATORY_CARE_PROVIDER_SITE_OTHER): Payer: BC Managed Care – PPO | Admitting: Family Medicine

## 2016-10-08 VITALS — BP 112/76 | HR 79 | Ht 66.0 in | Wt 204.0 lb

## 2016-10-08 DIAGNOSIS — R42 Dizziness and giddiness: Secondary | ICD-10-CM

## 2016-10-08 DIAGNOSIS — H9313 Tinnitus, bilateral: Secondary | ICD-10-CM | POA: Diagnosis not present

## 2016-10-08 DIAGNOSIS — J309 Allergic rhinitis, unspecified: Secondary | ICD-10-CM

## 2016-10-08 NOTE — Patient Instructions (Signed)
Follow up if not better in one months.

## 2016-10-08 NOTE — Progress Notes (Signed)
Subjective:    Patient ID: Briana Parks, female    DOB: [birth year], 51 y.o.   MRN: [id number]  HPI 51 yo female c/o dizziness  That occurs intermittently. She says happened on July 4 early in the morning and it's happened 2 times since then where happen in the evening and she was just able to go to bed and sleep it off. She describes it as room spinning. She does get nauseated with it and did have some dry heaves but no actual vomiting. No significant headaches. No other fevers or chills. She denies feeling like she was going to pass out. She denies any lightheadedness going from sitting to standing or bending over and then sitting up.  It seems to have resolved on its own. She also complains of ear ringing bilaterally. This is been chronic and has been going on for several years. She denies any significant hearing loss.  She also complains of chronic allergic rhinitis. She says she is at the point now where none of the oral agents or nasal spray seem to provide any significant relief for her. She is at the point where she is interested in possibly seeing an allergist. She's definitely sure she has a grass allergy as well as a cat allergy.   Review of Systems  BP 112/76   Pulse 79   Ht 5\' 6"  (1.676 m)   Wt 204 lb (92.5 kg)   BMI 32.93 kg/m     Allergies  Allergen Reactions  . Eggs Or Egg-Derived Products   . Sulfamethoxazole-Trimethoprim     REACTION: Hives  . Tdap [Diphth-Acell Pertussis-Tetanus] Other (See Comments)    Skin reaction with lymph node sweeling    Past Medical History:  Diagnosis Date  . ALLERGIC RHINITIS CAUSE UNSPECIFIED 08/03/2008   Qualifier: Diagnosis of  By: Thomos Lemons    . Headache 09/30/2013  . Obstructive sleep apnea 7cm water pressure CPAP 10/24/2010   Moderate. Sleep study was performed at [facility] neurological clinic by Dr. Marliss Coots on 10/02/2010. AHI was 19.8 with desaturations to 79%. Frequent severe snoring. Moderate wedge works associated  with respiratory events. Difficulty initiating and maintaining sleep with no slow wave sleep. She had severe OSA and REM.   . Osteoarthritis of left knee 10/26/2013  . Rosacea 09/13/2010    Past Surgical History:  Procedure Laterality Date  . bone spur removal Right 02/17/2012   foot  . bone spur removal  08/2015  . HERNIA REPAIR    . KNEE ARTHROSCOPY Left 03/16/14   [facility]   . tonsillectome and adnoidectomy    . TUBAL LIGATION      Social History   Social History  . Marital status: Married    Spouse name: Fayrene Fearing  . Number of children: 2  . Years of education: college   Occupational History  .  [employer]    [employer]   Social History Main Topics  . Smoking status: Never Smoker  . Smokeless tobacco: Never Used  . Alcohol use 0.6 oz/week    1 Glasses of wine per week     Comment: rare  . Drug use: No  . Sexual activity: Not on file     Comment: cake decorator, 2 yrs college, married, 2 children, 4 sodas daily, no regular exercise   Other Topics Concern  . Not on file   Social History Narrative   Patient lives at home with her husband Fayrene Fearing)   Education two years  of college education.   Patient works for [employer]. [employer]   Right handed.   Caffeine three cups of coke daily.          Family History  Problem Relation Age of Onset  . Arthritis Mother   . Fibromyalgia Mother   . Cancer Mother        liver small intestine carcinoid tumor    Outpatient Encounter Prescriptions as of 10/08/2016  Medication Sig  . AMBULATORY NON FORMULARY MEDICATION Medication Name: 7 cm water pressure CPAP, heated/humidified machine.   Dx : Severe OSA.  Marland Kitchen. NORTREL 7/7/7 0.5/0.75/1-35 MG-MCG tablet TAKE 1 TABLET BY MOUTH DAILY.  Marland Kitchen. propranolol (INDERAL) 20 MG tablet Take 1 tablet (20 mg total) by mouth daily.   No facility-administered encounter medications on file as of 10/08/2016.          Objective:   Physical Exam  Constitutional: She is  oriented to person, place, and time. She appears well-developed and well-nourished.  HENT:  Head: Normocephalic and atraumatic.  Right Ear: External ear normal.  Left Ear: External ear normal.  Nose: Nose normal.  Mouth/Throat: Oropharynx is clear and moist.  TMs and canals are clear.   Eyes: Pupils are equal, round, and reactive to light. Conjunctivae and EOM are normal.  Neck: Neck supple. No thyromegaly present.  Cardiovascular: Normal rate, regular rhythm and normal heart sounds.   Pulmonary/Chest: Effort normal and breath sounds normal. She has no wheezes.  Lymphadenopathy:    She has no cervical adenopathy.  Neurological: She is alert and oriented to person, place, and time.  Negative Dix-Hallpike maneuver.  Skin: Skin is warm and dry.  Psychiatric: She has a normal mood and affect.          Assessment & Plan:  Vertigo-based on her description it sounds more consistent with vertigo. She is currently asymptomatic. Negative Dix-Hallpike maneuver today. Gave reassurance. Will give her handout on exercises to do on her own at home.  Bilateral tinnitus-refer to ENT for further evaluation. I did explain to her though that most ear ringing doesn't have a distinct cause and doesn't have a specific sure. I do think it's worth evaluating her further and make sure she does not have any hearing loss with it.  Chronic allergic rhinitis-will refer to allergy and immunology for further treatment option since she has failed almost all over the counters. I think she is at a point where she would benefit from immunotherapy.

## 2016-10-14 NOTE — Addendum Note (Signed)
Addended by: Thom ChimesHENRY, Wanita Derenzo M on: 10/14/2016 01:37 PM   Modules accepted: Orders

## 2016-10-28 ENCOUNTER — Encounter: Payer: BC Managed Care – PPO | Admitting: Family Medicine

## 2017-03-12 ENCOUNTER — Encounter: Payer: Self-pay | Admitting: Family Medicine

## 2017-03-12 ENCOUNTER — Ambulatory Visit: Payer: BC Managed Care – PPO | Admitting: Family Medicine

## 2017-03-12 VITALS — BP 107/57 | HR 60 | Ht 66.0 in | Wt 203.0 lb

## 2017-03-12 DIAGNOSIS — M159 Polyosteoarthritis, unspecified: Secondary | ICD-10-CM

## 2017-03-12 DIAGNOSIS — M150 Primary generalized (osteo)arthritis: Secondary | ICD-10-CM | POA: Diagnosis not present

## 2017-03-12 DIAGNOSIS — B372 Candidiasis of skin and nail: Secondary | ICD-10-CM | POA: Diagnosis not present

## 2017-03-12 MED ORDER — NYSTATIN 100000 UNIT/GM EX POWD: Freq: Four times a day (QID) | CUTANEOUS | 1 refills | Status: DC

## 2017-03-12 NOTE — Progress Notes (Signed)
Subjective:    Patient ID: Briana MajorHeather L Parks, female    DOB: 11/27/1965, 51 y.o.   MRN: [id number]  HPI 51 year old female comes in today complaining of a rash underneath both breasts.  She started to notice an odor about 2 or 3 weeks ago.  Last night it was actually burning so she called this morning.  She is mostly been using a cornstarch baby powder to help absorb excess moisture.  She works near [employer] and does a lot of sweating at work.   She also wanted to discuss arthritis particularly in her knees feet shoulders and now more recently her hands she knows that she has arthritis and has been trying to use ibuprofen arthritis but says it really has not been very helpful for her.  Review of Systems  BP (!) 107/57   Pulse 60   Ht 5\' 6"  (1.676 m)   Wt 203 lb (92.1 kg)   SpO2 98%   BMI 32.77 kg/m     Allergies  Allergen Reactions  . Eggs Or Egg-Derived Products   . Sulfamethoxazole-Trimethoprim     REACTION: Hives  . Tdap [Diphth-Acell Pertussis-Tetanus] Other (See Comments)    Skin reaction with lymph node sweeling    Past Medical History:  Diagnosis Date  . ALLERGIC RHINITIS CAUSE UNSPECIFIED 08/03/2008   Qualifier: Diagnosis of  By: Thomos LemonsBowen DO, Karen    . Headache 09/30/2013  . Obstructive sleep apnea 7cm water pressure CPAP 10/24/2010   Moderate. [facility] by Dr. Marliss Cootslark Pion on 10/02/2010. AHI was 19.8 with desaturations to 79%. Frequent severe snoring. Moderate wedge works associated with respiratory events. Difficulty initiating and maintaining sleep with no slow wave sleep. She had severe OSA and REM.   . Osteoarthritis of left knee 10/26/2013  . Rosacea 09/13/2010    Past Surgical History:  Procedure Laterality Date  . bone spur removal Right 02/17/2012   foot  . bone spur removal  08/2015  . HERNIA REPAIR    . KNEE ARTHROSCOPY Left 03/16/14   [facility]   . tonsillectome and adnoidectomy    . TUBAL  LIGATION      Social History   Socioeconomic History  . Marital status: Married    Spouse name: [name]  . Number of children: 2  . Years of education: college  . Highest education level: Not on file  Social Needs  . Financial resource strain: Not on file  . Food insecurity - worry: Not on file  . Food insecurity - inability: Not on file  . Transportation needs - medical: Not on file  . Transportation needs - non-medical: Not on file  Occupational History    Employer: [employer]    Comment[employer]  Tobacco Use  . Smoking status: Never Smoker  . Smokeless tobacco: Never Used  Substance and Sexual Activity  . Alcohol use: Yes    Alcohol/week: 0.6 oz    Types: 1 Glasses of wine per week    Comment: rare  . Drug use: No  . Sexual activity: Not on file    Comment: cake decorator, 2 yrs college, married, 2 children, 4 sodas daily, no regular exercise  Other Topics Concern  . Not on file  Social History Narrative   Patient lives at home with her husband Fayrene Fearing(James)   Education two years of college education.   Patient works for [employer]. [employer]   Right handed.   Caffeine three cups of  coke daily.          Family History  Problem Relation Age of Onset  . Arthritis Mother   . Fibromyalgia Mother   . Cancer Mother        liver small intestine carcinoid tumor    Outpatient Encounter Medications as of 03/12/2017  Medication Sig  . AMBULATORY NON FORMULARY MEDICATION Medication Name: 7 cm water pressure CPAP, heated/humidified machine.   Dx : Severe OSA.  Marland Kitchen. NORTREL 7/7/7 0.5/0.75/1-35 MG-MCG tablet TAKE 1 TABLET BY MOUTH DAILY.  Marland Kitchen. nystatin (MYCOSTATIN/NYSTOP) powder Apply topically 4 (four) times daily.  . propranolol (INDERAL) 20 MG tablet Take 1 tablet (20 mg total) by mouth daily.   No facility-administered encounter medications on file as of 03/12/2017.           Objective:   Physical Exam  Constitutional: She is oriented to person, place, and time.  She appears well-developed and well-nourished.  HENT:  Head: Normocephalic and atraumatic.  Eyes: Conjunctivae and EOM are normal.  Cardiovascular: Normal rate.  Pulmonary/Chest: Effort normal.  Neurological: She is alert and oriented to person, place, and time.  Skin: Skin is dry. No pallor.  Underneath both breasts she has some erythema with papular areas.  She also has a few seborrheic keratosis which are also inflamed.  Currently she has a powder on.  Cracking of the skin or active oozing.  Psychiatric: She has a normal mood and affect. Her behavior is normal.  Vitals reviewed.       Assessment & Plan:  Dermatitis-we will treat with nystatin powder.  Treat daily for 10 days.  If not improving then please let us know.  Recommend wearing cotton clothing with the switch of sore moisture and even changing bras or clothing midday if needed if she is particularly sweating.  Osteo arthritis of multiple sites-we had a discussion today about certainly some things that she can try.  There is some good study showing that Tylenol in particular can reduce pain by about 30%.  Recommend a trial of Tylenol arthritis up to 3 times a day.  She could certainly try something like glucosamine chondroitin.  Discussed with her that stems studies show a positive benefit though some are neutral but it may be worth trying to see if she in particular experience is any benefit.  There is also been a lot of reports of tumor it being helpful and overall is pretty benign so she would like to try that as well.  Discussed the importance of weight loss and she says that she has been working on that as well.

## 2017-03-12 NOTE — Patient Instructions (Signed)
Recommend a trial of glucosamine/chondroitin for about 6 weeks.  If it is not helpful then can try turmeric for 6 weeks.  Also may want to get Tylenol arthritis to try.  1 tab 3 times a day.  Study showed can improve pain by about 30%.

## 2017-04-10 ENCOUNTER — Other Ambulatory Visit: Payer: Self-pay | Admitting: Family Medicine

## 2017-05-01 ENCOUNTER — Encounter: Payer: Self-pay | Admitting: Osteopathic Medicine

## 2017-05-01 ENCOUNTER — Ambulatory Visit: Payer: BC Managed Care – PPO | Admitting: Osteopathic Medicine

## 2017-05-01 VITALS — BP 125/75 | HR 83 | Temp 98.1°F | Wt 205.3 lb

## 2017-05-01 DIAGNOSIS — R300 Dysuria: Secondary | ICD-10-CM

## 2017-05-01 LAB — POCT URINALYSIS DIPSTICK
BILIRUBIN UA: NEGATIVE
Glucose, UA: NEGATIVE
KETONES UA: NEGATIVE
Leukocytes, UA: NEGATIVE
NITRITE UA: NEGATIVE
PH UA: 6.5 (ref 5.0–8.0)
PROTEIN UA: NEGATIVE
RBC UA: NEGATIVE
Spec Grav, UA: 1.01 (ref 1.010–1.025)
UROBILINOGEN UA: 0.2 U/dL

## 2017-05-01 MED ORDER — CIPROFLOXACIN HCL 500 MG PO TABS: 500.0000 mg | ORAL_TABLET | Freq: Two times a day (BID) | ORAL | 0 refills | Status: DC

## 2017-05-01 NOTE — Progress Notes (Signed)
HPI: Briana MajorHeather L Parks is a 52 y.o. female who  has a past medical history of ALLERGIC RHINITIS CAUSE UNSPECIFIED (08/03/2008), Headache (09/30/2013), Obstructive sleep apnea 7cm water pressure CPAP (10/24/2010), Osteoarthritis of left knee (10/26/2013), and Rosacea (09/13/2010).  she [facility] today, 05/01/17,  for chief complaint of: Possible UTI  Burning on urination, LBP x4 days. Took her son's leftover amoxicillin 500 mg tid since Sunday and is now out of this medication.  Hematuria Monday and Tuesday (today is Thursday).  Worried about kidney infection due to persistence of lower back pain and dysuria.     Past medical, surgical, social and family history reviewed:  Patient Active Problem List   Diagnosis Date Noted  . Abnormal MRI of head 10/03/2015  . Osteoarthritis of left knee 10/26/2013  . Headache 09/30/2013  . Obstructive sleep apnea 7cm water pressure CPAP 10/24/2010  . Rosacea 09/13/2010  . ALLERGIC RHINITIS CAUSE UNSPECIFIED 08/03/2008  . SPONDYLOLISTHESIS, ACQUIRED 05/21/2006  . SCOLIOSIS 12/31/2005  . SHOULDER/ARM SPRAIN/STRAIN, UNSPEC. 12/31/2005    Past Surgical History:  Procedure Laterality Date  . bone spur removal Right 02/17/2012   foot  . bone spur removal  08/2015  . HERNIA REPAIR    . KNEE ARTHROSCOPY Left 03/16/14   [facility]   . tonsillectome and adnoidectomy    . TUBAL LIGATION      Social History   Tobacco Use  . Smoking status: Never Smoker  . Smokeless tobacco: Never Used  Substance Use Topics  . Alcohol use: Yes    Alcohol/week: 0.6 oz    Types: 1 Glasses of wine per week    Comment: rare    Family History  Problem Relation Age of Onset  . Arthritis Mother   . Fibromyalgia Mother   . Cancer Mother        liver small intestine carcinoid tumor     Current medication list and allergy/intolerance information reviewed:    Current Outpatient Medications   Medication Sig Dispense Refill  . AMBULATORY NON FORMULARY MEDICATION Medication Name: 7 cm water pressure CPAP, heated/humidified machine.   Dx : Severe OSA. 1 Units 0  . NORTREL 7/7/7 0.5/0.75/1-35 MG-MCG tablet TAKE 1 TABLET EVERY DAY 84 tablet 3  . nystatin (MYCOSTATIN/NYSTOP) powder Apply topically 4 (four) times daily. 60 g 1  . propranolol (INDERAL) 20 MG tablet Take 1 tablet (20 mg total) by mouth daily. 90 tablet 3   No current facility-administered medications for this visit.     Allergies  Allergen Reactions  . Eggs Or Egg-Derived Products   . Sulfamethoxazole-Trimethoprim     REACTION: Hives  . Tdap [Diphth-Acell Pertussis-Tetanus] Other (See Comments)    Skin reaction with lymph node sweeling      Review of Systems:  Constitutional:  No  fever, no chills  HEENT: No  headache  Cardiac: No  chest pain  Respiratory:  No  shortness of breath  Gastrointestinal: No  abdominal pain, No  nausea  Musculoskeletal: +LBP  Skin: No  Rash  Genitourinary: No  incontinence, No  abnormal genital bleeding, No abnormal genital discharge  Neurologic: No  weakness, No  dizziness  Exam:  BP 125/75   Pulse 83   Temp 98.1 F (36.7 C) (Oral)   Wt 205 lb 4.8 oz (93.1 kg)   LMP 04/24/2017   BMI 33.14 kg/m   Constitutional: VS see above. General Appearance: alert, well-developed, well-nourished, NAD  Neck: No masses, trachea midline.  Respiratory: Normal respiratory effort.  Musculoskeletal: Gait normal. . Negative Lloyd sign bilaterally  Neurological: Normal balance/coordination. No tremor.   Skin: warm, dry, intact. No rash/ulcer. No concerning nevi or subq nodules on limited exam.    Psychiatric: Normal judgment/insight. Normal mood and affect. Oriented x3.    Results for orders placed or performed in visit on 05/01/17 (from the past 72 hour(s))  POCT Urinalysis Dipstick     Status: None   Collection Time: 05/01/17  3:44 PM  Result Value Ref Range   Color,  UA yellow    Clarity, UA clear    Glucose, UA negative    Bilirubin, UA negative    Ketones, UA negative    Spec Grav, UA 1.010 1.010 - 1.025   Blood, UA negative    pH, UA 6.5 5.0 - 8.0   Protein, UA negative    Urobilinogen, UA 0.2 0.2 or 1.0 E.U./dL   Nitrite, UA negative    Leukocytes, UA Negative Negative   Appearance     Odor      No results found.   ASSESSMENT/PLAN:   Dysuria - Plan: POCT Urinalysis Dipstick, Urine Culture, Urinalysis, microscopic only   Discussed likelihood of negative urine culture given that she has been on antibiotics, previous urine culture showed resistance to Augmentin. Amoxicillin is not a first-line antibiotics for urinary tract infections and she should never take antibiotics unless instructed by a qualified medical provider. At any rate, we'll go ahead and trial ciprofloxacin, consider preventative UTI prophylaxis, patient states that she is particularly prone to UTIs that we do not have a recent culture positive result since 2015. Would follow-up with PCP on this issue   Meds ordered this encounter  Medications  . ciprofloxacin (CIPRO) 500 MG tablet    Sig: Take 1 tablet (500 mg total) by mouth 2 (two) times daily.    Dispense:  10 tablet    Refill:  0      Visit summary with medication list and pertinent instructions was printed for patient to review. All questions at time of visit were answered - patient instructed to contact office with any additional concerns. ER/RTC precautions were reviewed with the patient.   Follow-up plan: Return if symptoms worsen or fail to improve, and as directed for routine care with Dr Linford Arnold .  Note: Total time spent 25 minutes, greater than 50% of the visit was spent face-to-face counseling and coordinating care for the following: The encounter diagnosis was Dysuria..  Please note: voice recognition software was used to produce this document, and typos may escape review. Please contact Dr. Lyn Hollingshead for  any needed clarifications.

## 2017-05-02 LAB — URINALYSIS, MICROSCOPIC ONLY
Bacteria, UA: NONE SEEN /HPF
HYALINE CAST: NONE SEEN /LPF
RBC / HPF: NONE SEEN /HPF (ref 0–2)

## 2017-05-03 LAB — URINE CULTURE
MICRO NUMBER: 90167573
Result:: NO GROWTH
SPECIMEN QUALITY: ADEQUATE

## 2017-09-30 NOTE — Progress Notes (Signed)
[facility]  PATIENT: GENOVA KINER DOB: 23-Aug-1965   REASON FOR VISIT: Follow-up for episodic headache, abnormal MRI of the brain HISTORY FROM: Patient    HISTORY OF PRESENT ILLNESS: NEAVEH BELANGER is a 52 year old right-handed Caucasian female, accompanied by her husband, referred by the primary care physician Dr. Eppie Gibson for evaluation of headaches, review of her MRI She had a past medical history of occasionally headaches, bilateral frontal, pressure, couple times a month, was no light noise sensitivity, Tylenol or Advil has been very helpful, trigger for her headaches are usually [place], allergies, Over the past 2 years, she also developed postcoital headaches, and this headache is located at the vertex region, severe, pounding, as if her head is going to explode, she usually goes to sleep afterwards, wake up will be much better, there was no focal deficit during the episodes, it happened about 50% to time. For that reason, she received MRI of the brain in September 17 2013 at [facility] imaging, we have reviewed the films together,The right lateral ventricle is larger than the left without corresponding encephalomalacia. 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  Otherwise unremarkable MRI of the brain.  Unremarkable MRA of the intracranial circulation. Specifically, no evidence for intracranial aneurysm. She was given indomethacin 25 mg as needed for headaches, was not sure it was helpful She has history of one grand mal seizure in 1994, had MRI of the brain in [place] at that time, was told she had "a black spot in the brain, likely due to previous trauma." She was getting prescription of Tegretol for a while, no recurrent seizure  UPDATE 7/11/16CM Ms. Jagielski, 52 year old female returns for follow-up. She is only taking her Inderal when necessary and has maybe 5 headaches a month. She is not  aware of any food triggers. She continues to have postcoital headaches. She has rosacea on her face and says she has been in several research trials without much benefit from medication. MRI of the brain 09/17/2013 shows enlarged right ventricle chronic changes no evidence of encephalomalacia. She returns for reevaluation UPDATE 10/03/15 CM Ms. Gillean, 52 year old female returns for follow-up. She has a history of episodic headaches which are much less severe since she has been on Inderal. She had a heel spur surgery the last of  June and is wearing a boot to the left lower leg. She is touchdown weightbearing. She begins her physical therapy tomorrow. MRI of the brain in 2015 showed enlarged right ventricle chronic changes. She is due for repeat MRI. She returns for reevaluation  UPDATE 07/11/2018CM Ms. Slinker, 52 year old female returns for follow-up with a history of episodic headaches. She may have 1-3 headaches per month at the most. She is currently on propanolol 20 mg daily with good benefit. MRI of the brain after her last visit on 10/23/2015 with unchanged enlargement of the right lateral ventricle and no new abnormality. She continues to work full-time and has not missed any work due to her headaches. She also has a history of obstructive sleep apnea and uses CPAP. She returns for reevaluation UPDATE 7/11/2019CM Ms.Wegener, 52 year old female returns for follow-up with history of episodic headaches.  She is currently on Inderal 20 mg daily.  She continues to have 1-3 headaches per month and when she has a headache she takes an extra Inderal.  She continues to work full-time and has not missed any work due to her headaches.  She also has  a history of obstructive sleep apnea and uses CPAP every night she says.MRI of the brain 10/23/2015 with unchanged enlargement of the right lateral ventricle and no new abnormality.  She returns for reevaluation.   REVIEW OF SYSTEMS: Full 14 system review of systems  performed and notable only for those listed, all others are neg:  Constitutional: neg  Cardiovascular: neg Ear/Nose/Throat: neg  Skin: neg Eyes: neg Respiratory: neg Gastroitestinal: neg  Hematology/Lymphatic: neg  Endocrine: neg Musculoskeletal: back  pain Allergy/Immunology: neg Neurological: Headaches Psychiatric: neg Sleep : Obstructive sleep apnea with CPAP   ALLERGIES: Allergies  Allergen Reactions  . Eggs Or Egg-Derived Products   . Sulfamethoxazole-Trimethoprim     REACTION: Hives  . Tdap [Tetanus-Diphth-Acell Pertussis] Other (See Comments)    Skin reaction with lymph node sweeling    HOME MEDICATIONS: Outpatient Medications Prior to Visit  Medication Sig Dispense Refill  . AMBULATORY NON FORMULARY MEDICATION Medication Name: 7 cm water pressure CPAP, heated/humidified machine.   Dx : Severe OSA. 1 Units 0  . ibuprofen (ADVIL,MOTRIN) 200 MG tablet Take 200 mg by mouth every 6 (six) hours as needed.    Marland Kitchen NORTREL 7/7/7 0.5/0.75/1-35 MG-MCG tablet TAKE 1 TABLET EVERY DAY 84 tablet 3  . nystatin (MYCOSTATIN/NYSTOP) powder Apply topically 4 (four) times daily. 60 g 1  . propranolol (INDERAL) 20 MG tablet Take 1 tablet (20 mg total) by mouth daily. (Patient taking differently: Take 20 mg by mouth 2 (two) times daily. ) 90 tablet 3  . ciprofloxacin (CIPRO) 500 MG tablet Take 1 tablet (500 mg total) by mouth 2 (two) times daily. 10 tablet 0   No facility-administered medications prior to visit.     PAST MEDICAL HISTORY: Past Medical History:  Diagnosis Date  . ALLERGIC RHINITIS CAUSE UNSPECIFIED 08/03/2008   Qualifier: Diagnosis of  By: Thomos Lemons    . Headache 09/30/2013  . Obstructive sleep apnea 7cm water pressure CPAP 10/24/2010   Moderate. Sleep study was performed at [facility] by Dr. Marliss Coots on 10/02/2010. AHI was 19.8 with desaturations to 79%. Frequent severe snoring. Moderate wedge works associated with respiratory events. Difficulty  initiating and maintaining sleep with no slow wave sleep. She had severe OSA and REM.   . Osteoarthritis of left knee 10/26/2013  . Rosacea 09/13/2010    PAST SURGICAL HISTORY: Past Surgical History:  Procedure Laterality Date  . bone spur removal Right 02/17/2012   foot  . bone spur removal  08/2015  . HERNIA REPAIR    . KNEE ARTHROSCOPY Left 03/16/14   [facility]. [facility]   . tonsillectome and adnoidectomy    . TUBAL LIGATION      FAMILY HISTORY: Family History  Problem Relation Age of Onset  . Arthritis Mother   . Fibromyalgia Mother   . Cancer Mother        liver small intestine carcinoid tumor    SOCIAL HISTORY: Social History   Socioeconomic History  . Marital status: Married    Spouse name: Fayrene Fearing  . Number of children: 2  . Years of education: college  . Highest education level: Not on file  Occupational History    Employer: [employer]    Comment: [employer]  Social Needs  . Financial resource strain: Not on file  . Food insecurity:    Worry: Not on file    Inability: Not on file  . Transportation needs:    Medical: Not on file    Non-medical: Not on file  Tobacco Use  . Smoking status: Never Smoker  . Smokeless tobacco: Never Used  Substance and Sexual Activity  . Alcohol use: Yes    Alcohol/week: 0.6 oz    Types: 1 Glasses of wine per week    Comment: rare  . Drug use: No  . Sexual activity: Not on file    Comment: cake decorator, 2 yrs college, married, 2 children, 4 sodas daily, no regular exercise  Lifestyle  . Physical activity:    Days per week: Not on file    Minutes per session: Not on file  . Stress: Not on file  Relationships  . Social connections:    Talks on phone: Not on file    Gets together: Not on file    Attends religious service: Not on file    Active member of club or organization: Not on file    Attends meetings of clubs or organizations: Not on file    Relationship status: Not on file  . Intimate  partner violence:    Fear of current or ex partner: Not on file    Emotionally abused: Not on file    Physically abused: Not on file    Forced sexual activity: Not on file  Other Topics Concern  . Not on file  Social History Narrative   Patient lives at home with her husband Fayrene Fearing(James)   Education two years of college education.   Patient works for [employer]. [employer]   Right handed.   Caffeine three cups of coke daily.           PHYSICAL EXAM  Vitals:   10/02/17 0859  BP: (!) 152/82  Pulse: 61  Weight: 207 lb 9.6 oz (94.2 kg)  Height: 5\' 6"  (1.676 m)   Body mass index is 33.51 kg/m. Generalized: In no acute distress, obese female Neck: Supple,  Skin rosacea on her face Musculoskeletal: No deformity  Neurological examination Mentation: Alert oriented to time, place, history taking, and causual conversation Cranial nerve II-XII: Pupils were equal round reactive to light. Extraocular movements were full. Visual field were full on confrontational test. Bilateral fundi were sharp. Facial sensation and strength were normal. Hearing was intact to finger rubbing bilaterally. Uvula tongue midline. Head turning and shoulder shrug and were normal and symmetric.Tongue protrusion into cheek strength was normal. Motor: Normal tone, bulk and strength. Sensory: Intact to fine touch, pinprick and vibratory Coordination: Normal finger to nose, heel-to-shin bilaterally Gait: Rising up from seated position without assistance, normal stance, without trunk ataxia, moderate stride, good arm swing, smooth turning, tandem gait is steady  DIAGNOSTIC DATA (LABS, IMAGING, TESTING) -  MRI of the brain 10/23/2015 with unchanged enlargement of the right lateral ventricle and no new abnormality.  ASSESSMENT AND PLAN 52y.o. year old female has a past medical history of postcoital headaches and normal neurologic exam. MRI of the brain 10/23/2015 with unchanged enlargement of the right lateral  ventricle and no new abnormality.Patient  continues to have approximately 3 headaches per month.  She takes an extra Inderal.  PLAN: Increase   Inderal 20mg  twice daily  will refill Call for increase in headaches Follow up in 1 year Nilda RiggsNancy Carolyn Martin, Ascension - All SaintsGNP, [facility]al[facility] HospitalBC, APRN  [facility] [address], [address] [place], [place] [id number] [phone]

## 2017-10-02 ENCOUNTER — Ambulatory Visit: Payer: BC Managed Care – PPO | Admitting: Nurse Practitioner

## 2017-10-02 ENCOUNTER — Encounter: Payer: Self-pay | Admitting: Nurse Practitioner

## 2017-10-02 VITALS — BP 152/82 | HR 61 | Ht 66.0 in | Wt 207.6 lb

## 2017-10-02 DIAGNOSIS — R519 Headache, unspecified: Secondary | ICD-10-CM

## 2017-10-02 DIAGNOSIS — R51 Headache: Secondary | ICD-10-CM

## 2017-10-02 MED ORDER — PROPRANOLOL HCL 20 MG PO TABS: 20.0000 mg | ORAL_TABLET | Freq: Two times a day (BID) | ORAL | 3 refills | Status: DC

## 2017-10-02 NOTE — Patient Instructions (Signed)
Increase  To Inderal 20mg  twice daily  will refill Call for increase in headaches Follow up in 1 year

## 2017-10-02 NOTE — Progress Notes (Signed)
I have reviewed and agreed above plan. 

## 2017-10-09 ENCOUNTER — Other Ambulatory Visit: Payer: Self-pay | Admitting: Nurse Practitioner

## 2017-10-30 ENCOUNTER — Encounter: Payer: Self-pay | Admitting: Family Medicine

## 2017-10-30 ENCOUNTER — Ambulatory Visit: Payer: BC Managed Care – PPO | Admitting: Family Medicine

## 2017-10-30 VITALS — BP 134/60 | HR 70 | Ht 66.0 in | Wt 210.0 lb

## 2017-10-30 DIAGNOSIS — Z0000 Encounter for general adult medical examination without abnormal findings: Secondary | ICD-10-CM

## 2017-10-30 DIAGNOSIS — Z1231 Encounter for screening mammogram for malignant neoplasm of breast: Secondary | ICD-10-CM

## 2017-10-30 MED ORDER — NYSTATIN 100000 UNIT/GM EX CREA: 1.0000 "application " | TOPICAL_CREAM | Freq: Two times a day (BID) | CUTANEOUS | 1 refills | Status: DC

## 2017-10-30 NOTE — Progress Notes (Signed)
Subjective:     Briana Parks is a 52 y.o. female and is here for a comprehensive physical exam. The patient reports no problems.  She is doing well overall.  She starts that [employer] in about 2 weeks.  She plans on try to get her mammogram done in these next 2 weeks.  She is still having her periods but they have become shorter instead of every 7 days is about 3 to 4 days.  Though she still passing mostly what looks like all blood and clots.  She will sometimes have some pain and cramping with her periods.  Last spring she did go about 3 months without one but ever since then it has been regular.  She still has rash in the groin crease area where she sweats at night.  She is been using nystatin powder but would like to try the cream instead.  Social History   Socioeconomic History  . Marital status: Married    Spouse name: Briana Parks  . Number of children: 2  . Years of education: college  . Highest education level: Not on file  Occupational History    Employer: [employer]    Comment: [employer]  Social Needs  . Financial resource strain: Not on file  . Food insecurity:    Worry: Not on file    Inability: Not on file  . Transportation needs:    Medical: Not on file    Non-medical: Not on file  Tobacco Use  . Smoking status: Never Smoker  . Smokeless tobacco: Never Used  Substance and Sexual Activity  . Alcohol use: Yes    Alcohol/week: 1.0 standard drinks    Types: 1 Glasses of wine per week    Comment: rare  . Drug use: No  . Sexual activity: Not on file    Comment: cake decorator, 2 yrs college, married, 2 children, 4 sodas daily, no regular exercise  Lifestyle  . Physical activity:    Days per week: Not on file    Minutes per session: Not on file  . Stress: Not on file  Relationships  . Social connections:    Talks on phone: Not on file    Gets together: Not on file    Attends religious service: Not on file    Active member of club or organization: Not  on file    Attends meetings of clubs or organizations: Not on file    Relationship status: Not on file  . Intimate partner violence:    Fear of current or ex partner: Not on file    Emotionally abused: Not on file    Physically abused: Not on file    Forced sexual activity: Not on file  Other Topics Concern  . Not on file  Social History Narrative   Patient lives at home with her husband Briana Fearing(James)   Education two years of college education.   Patient works for [employer]. [employer]   Right handed.   Caffeine three cups of coke daily.         Health Maintenance  Topic Date Due  . COLONOSCOPY  05/18/2017  . PAP SMEAR  09/11/2017  . MAMMOGRAM  10/24/2017  . INFLUENZA VACCINE  10/23/2017  . HIV Screening  Completed    The following portions of the patient's history were reviewed and updated as appropriate: allergies, current medications, past family history, past medical history, past social history, past surgical history and problem list.  Review of Systems  A comprehensive review of systems was negative.   Objective:    BP 134/60   Pulse 70   Ht 5\' 6"  (1.676 m)   Wt 210 lb (95.3 kg)   LMP 10/28/2017 (Exact Date)   SpO2 98%   BMI 33.89 kg/m  General appearance: alert, cooperative and appears stated age Head: Normocephalic, without obvious abnormality, atraumatic Eyes: conj clear, EOMi, PEERLA Ears: normal TM's and external ear canals both ears Nose: Nares normal. Septum midline. Mucosa normal. No drainage or sinus tenderness. Throat: lips, mucosa, and tongue normal; teeth and gums normal Neck: no adenopathy, no carotid bruit, no JVD, supple, symmetrical, trachea midline and thyroid not enlarged, symmetric, no tenderness/mass/nodules Back: symmetric, no curvature. ROM normal. No CVA tenderness. Lungs: clear to auscultation bilaterally Breasts: normal appearance, no masses or tenderness Heart: regular rate and rhythm, S1, S2 normal, no murmur, click, rub or gallop Abdomen:  soft, non-tender; bowel sounds normal; no masses,  no organomegaly Extremities: extremities normal, atraumatic, no cyanosis or edema Pulses: 2+ and symmetric Skin: Skin color, texture, turgor normal. No rashes or lesions Lymph nodes: Cervical, supraclavicular, and axillary nodes normal. Neurologic: Alert and oriented X 3, normal strength and tone. Normal symmetric reflexes. Normal coordination and gait    Assessment:    Healthy female exam.      Plan:     See After Visit Summary for Counseling Recommendations   Keep up a regular exercise program and make sure you are eating a healthy diet Try to eat 4 servings of dairy a day, or if you are lactose intolerant take a calcium with vitamin D daily.  Your vaccines are up to date.   Like to try the nystatin cream instead of the powder.  New prescription sent to pharmacy.  Very menopausal-I did warn that if her periods become heavy again or more frequent or prolonged to let us know.  If that happens and she will need further work-up and possible endometrial biopsy.  He knows she needs to get a colonoscopy and would like to get one but says she will be able to do it until probably next summer.  So she is declining for now.

## 2017-10-30 NOTE — Patient Instructions (Signed)

## 2017-10-31 ENCOUNTER — Ambulatory Visit (INDEPENDENT_AMBULATORY_CARE_PROVIDER_SITE_OTHER): Payer: BC Managed Care – PPO

## 2017-10-31 DIAGNOSIS — Z1231 Encounter for screening mammogram for malignant neoplasm of breast: Secondary | ICD-10-CM | POA: Diagnosis not present

## 2017-11-04 LAB — CBC
HEMATOCRIT: 42.6 % (ref 35.0–45.0)
HEMOGLOBIN: 14.4 g/dL (ref 11.7–15.5)
MCH: 30.1 pg (ref 27.0–33.0)
MCHC: 33.8 g/dL (ref 32.0–36.0)
MCV: 88.9 fL (ref 80.0–100.0)
MPV: 10.7 fL (ref 7.5–12.5)
PLATELETS: 241 10*3/uL (ref 140–400)
RBC: 4.79 10*6/uL (ref 3.80–5.10)
RDW: 12.6 % (ref 11.0–15.0)
WBC: 8.9 10*3/uL (ref 3.8–10.8)

## 2017-11-04 LAB — COMPLETE METABOLIC PANEL WITH GFR
AG Ratio: 1.5 (calc) (ref 1.0–2.5)
ALT: 27 U/L (ref 6–29)
AST: 20 U/L (ref 10–35)
Albumin: 4.1 g/dL (ref 3.6–5.1)
Alkaline phosphatase (APISO): 76 U/L (ref 33–130)
BUN: 13 mg/dL (ref 7–25)
CALCIUM: 9.3 mg/dL (ref 8.6–10.4)
CO2: 25 mmol/L (ref 20–32)
CREATININE: 0.6 mg/dL (ref 0.50–1.05)
Chloride: 105 mmol/L (ref 98–110)
GFR, EST NON AFRICAN AMERICAN: 106 mL/min/{1.73_m2} (ref >=60)
GFR, Est African American: 123 mL/min/{1.73_m2} (ref >=60)
GLOBULIN: 2.8 g/dL (ref 1.9–3.7)
Glucose, Bld: 96 mg/dL (ref 65–99)
Potassium: 4.3 mmol/L (ref 3.5–5.3)
SODIUM: 139 mmol/L (ref 135–146)
TOTAL PROTEIN: 6.9 g/dL (ref 6.1–8.1)
Total Bilirubin: 0.5 mg/dL (ref 0.2–1.2)

## 2017-11-04 LAB — LIPID PANEL
CHOL/HDL RATIO: 4.6 (calc) (ref <5.0)
Cholesterol: 202 mg/dL — ABNORMAL HIGH (ref <200)
HDL: 44 mg/dL — AB (ref >50)
LDL Cholesterol (Calc): 126 mg/dL (calc) — ABNORMAL HIGH
NON-HDL CHOLESTEROL (CALC): 158 mg/dL — AB (ref <130)
TRIGLYCERIDES: 198 mg/dL — AB (ref <150)

## 2018-02-11 ENCOUNTER — Other Ambulatory Visit: Payer: Self-pay | Admitting: Family Medicine

## 2018-09-21 ENCOUNTER — Other Ambulatory Visit: Payer: Self-pay | Admitting: *Deleted

## 2018-09-21 MED ORDER — PROPRANOLOL HCL 20 MG PO TABS: 20.0000 mg | ORAL_TABLET | Freq: Two times a day (BID) | ORAL | 3 refills | Status: DC

## 2018-10-08 ENCOUNTER — Ambulatory Visit: Payer: BC Managed Care – PPO | Admitting: Nurse Practitioner

## 2018-10-08 ENCOUNTER — Ambulatory Visit: Payer: BC Managed Care – PPO | Admitting: Neurology

## 2018-10-08 ENCOUNTER — Other Ambulatory Visit: Payer: Self-pay

## 2018-10-08 ENCOUNTER — Encounter: Payer: Self-pay | Admitting: Neurology

## 2018-10-08 VITALS — BP 114/59 | HR 57 | Temp 96.9°F | Ht 66.0 in | Wt 211.2 lb

## 2018-10-08 DIAGNOSIS — R930 Abnormal findings on diagnostic imaging of skull and head, not elsewhere classified: Secondary | ICD-10-CM | POA: Diagnosis not present

## 2018-10-08 DIAGNOSIS — R519 Headache, unspecified: Secondary | ICD-10-CM

## 2018-10-08 DIAGNOSIS — R51 Headache: Secondary | ICD-10-CM | POA: Diagnosis not present

## 2018-10-08 NOTE — Patient Instructions (Signed)
It was great to meet you!

## 2018-10-08 NOTE — Progress Notes (Signed)
I have reviewed and agreed above plan. 

## 2018-10-08 NOTE — Progress Notes (Signed)
PATIENT: Briana Parks DOB: Jul 02, 1965  REASON FOR VISIT: follow up HISTORY FROM: patient  HISTORY OF PRESENT ILLNESS: Today 10/08/18  HISTORY Briana Parks is a 53 year old right-handed Caucasian female, accompanied by her husband, referred by the primary care physician Dr. Suzi Roots for evaluation of headaches, review of her MRI She had a past medical history of occasionally headaches, bilateral frontal, pressure, couple times a month, was no light noise sensitivity, Tylenol or Advil has been very helpful, trigger for her headaches are usually [place], allergies, Over the past 2 years, she also developed postcoital headaches, and this headache is located at the vertex region, severe, pounding, as if her head is going to explode, she usually goes to sleep afterwards, wake up will be much better, there was no focal deficit during the episodes, it happened about 50% to time. For that reason, she received MRI of the brain in September 17 2013 at [facility], we have reviewed the films together,The right lateral ventricle is larger than the left without corresponding encephalomalacia. 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  Otherwise unremarkable MRI of the brain.  Unremarkable MRA of the intracranial circulation. Specifically, no evidence for intracranial aneurysm. She was given indomethacin 25 mg as needed for headaches, was not sure it was helpful She has history of one grand mal seizure in 1994, had MRI of the brain in [place] at that time, was told she had "a black spot in the brain, likely due to previous trauma." She was getting prescription of Tegretol for a while, no recurrent seizure  UPDATE 7/11/16CM Briana Parks, 53 year old female returns for follow-up. She is only taking her Inderal when necessary and has maybe 5 headaches a month. She is not aware of any food triggers. She continues to have postcoital  headaches. She has rosacea on her face and says she has been in several research trials without much benefit from medication. MRI of the brain 09/17/2013 shows enlarged right ventricle chronic changes no evidence of encephalomalacia. She returns for reevaluation UPDATE 10/03/15 CM Briana Parks, 53 year old female returns for follow-up. She has a history of episodic headaches which are much less severe since she has been on Inderal. She had a heel spur surgery the last of  June and is wearing a boot to the left lower leg. She is touchdown weightbearing. She begins her physical therapy tomorrow. MRI of the brain in 2015 showed enlarged right ventricle chronic changes. She is due for repeat MRI. She returns for reevaluation  UPDATE 07/11/2018CM Briana Parks, 53 year old female returns for follow-up with a history of episodic headaches. She may have 1-3 headaches per month at the most. She is currently on propanolol 20 mg daily with good benefit. MRI of the brain after her last visit on 10/23/2015 with unchanged enlargement of the right lateral ventricle and no new abnormality. She continues to work full-time and has not missed any work due to her headaches. She also has a history of obstructive sleep apnea and uses CPAP. She returns for reevaluation UPDATE 7/11/2019CM BrianaParks, 53 year old female returns for follow-up with history of episodic headaches.  She is currently on Inderal 20 mg daily.  She continues to have 1-3 headaches per month and when she has a headache she takes an extra Inderal.  She continues to work full-time and has not missed any work due to her headaches.  She also has a history of obstructive sleep apnea and uses CPAP  every night she says.MRI of the brain 10/23/2015 with unchanged enlargement of the right lateral ventricle and no new abnormality.  She returns for reevaluation.  Update 10/08/2018 SS: Ms. Ivy LynnMancari with history of episodic headaches, postcoital headaches, abnormal MRI brain. At  last visit her Inderal was increased 20 mg twice daily.  Last MRI of the brain 10/23/2015 unchanged enlargement of the right lateral ventricle.   She reports 1 headache a month, maybe 3 postcoital headaches. With a postcoital headache she will take an extra propranolol with benefit. Her pattern of headaches has been stable, no change. 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  She denies new problems or concerns.  Her overall health has been well.  She remains on propanolol 20 mg twice daily.  REVIEW OF SYSTEMS: Out of a complete 14 system review of symptoms, the patient complains only of the following symptoms, and all other reviewed systems are negative.  Headache   ALLERGIES: Allergies  Allergen Reactions  . Eggs Or Egg-Derived Products   . Sulfamethoxazole-Trimethoprim     REACTION: Hives  . Tdap [Tetanus-Diphth-Acell Pertussis] Other (See Comments)    Skin reaction with lymph node sweeling    HOME MEDICATIONS: Outpatient Medications Prior to Visit  Medication Sig Dispense Refill  . Acetaminophen (TYLENOL ARTHRITIS EXT RELIEF PO) Take by mouth 3 (three) times daily as needed.    . AMBULATORY NON FORMULARY MEDICATION Medication Name: 7 cm water pressure CPAP, heated/humidified machine.   Dx : Severe OSA. 1 Units 0  . AMBULATORY NON FORMULARY MEDICATION Take 2 tablets by mouth daily. Medication Name: Joint supplements    . ibuprofen (ADVIL,MOTRIN) 200 MG tablet Take 200 mg by mouth every 6 (six) hours as needed.    Marland Kitchen. NORTREL 7/7/7 0.5/0.75/1-35 MG-MCG tablet TAKE 1 TABLET EVERY DAY 84 tablet 3  . nystatin (MYCOSTATIN/NYSTOP) powder Apply topically 4 (four) times daily. 60 g 1  . nystatin cream (MYCOSTATIN) Apply 1 application topically 2 (two) times daily. 45 g 1  . propranolol (INDERAL) 20 MG tablet Take 1 tablet (20 mg total) by mouth 2 (two) times daily. 180 tablet 3   No facility-administered medications prior to visit.     PAST MEDICAL HISTORY: Past Medical History:   Diagnosis Date  . ALLERGIC RHINITIS CAUSE UNSPECIFIED 08/03/2008   Qualifier: Diagnosis of  By: Thomos LemonsBowen DO, Karen    . Headache 09/30/2013  . Obstructive sleep apnea 7cm water pressure CPAP 10/24/2010   Moderate. [facility] by Dr. Marliss Cootslark Pion on 10/02/2010. AHI was 19.8 with desaturations to 79%. Frequent severe snoring. Moderate wedge works associated with respiratory events. Difficulty initiating and maintaining sleep with no slow wave sleep. She had severe OSA and REM.   . Osteoarthritis of left knee 10/26/2013  . Rosacea 09/13/2010    PAST SURGICAL HISTORY: Past Surgical History:  Procedure Laterality Date  . bone spur removal Right 02/17/2012   foot  . bone spur removal  08/2015  . HERNIA REPAIR    . KNEE ARTHROSCOPY Left 03/16/14   Dr. Althea GrimmerGraves/[facility]   . tonsillectome and adnoidectomy    . TUBAL LIGATION      FAMILY HISTORY: Family History  Problem Relation Age of Onset  . Arthritis Mother   . Fibromyalgia Mother   . Cancer Mother        liver small intestine carcinoid tumor    SOCIAL HISTORY: Social History   Socioeconomic History  . Marital status: Married    Spouse  name: [name]  . Number of children: 2  . Years of education: college  . Highest education level: Not on file  Occupational History    Employer: [employer]    Comment: [employer]  Social Needs  . Financial resource strain: Not on file  . Food insecurity    Worry: Not on file    Inability: Not on file  . Transportation needs    Medical: Not on file    Non-medical: Not on file  Tobacco Use  . Smoking status: Never Smoker  . Smokeless tobacco: Never Used  Substance and Sexual Activity  . Alcohol use: Yes    Alcohol/week: 1.0 standard drinks    Types: 1 Glasses of wine per week    Comment: rare  . Drug use: No  . Sexual activity: Not on file    Comment: cake decorator, 2 yrs college, married, 2 children, 4 sodas daily, no regular  exercise  Lifestyle  . Physical activity    Days per week: Not on file    Minutes per session: Not on file  . Stress: Not on file  Relationships  . Social Musicianconnections    Talks on phone: Not on file    Gets together: Not on file    Attends religious service: Not on file    Active member of club or organization: Not on file    Attends meetings of clubs or organizations: Not on file    Relationship status: Not on file  . Intimate partner violence    Fear of current or ex partner: Not on file    Emotionally abused: Not on file    Physically abused: Not on file    Forced sexual activity: Not on file  Other Topics Concern  . Not on file  Social History Narrative   Patient lives at home with her husband Fayrene Fearing(James)   Education two years of college education.   Patient works for [employer]. [employer]   Right handed.   Caffeine three cups of coke daily.            PHYSICAL EXAM  Vitals:   10/08/18 0929  Temp: (!) 96.9 F (36.1 C)  TempSrc: Oral  Weight: 211 lb 3.2 oz (95.8 kg)  Height: 5\' 6"  (1.676 m)   Body mass index is 34.09 kg/m.  Generalized: Well developed, in no acute distress   Neurological examination  Mentation: Alert oriented to time, place, history taking. Follows all commands speech and language fluent Cranial nerve II-XII: Pupils were equal round reactive to light. Extraocular movements were full, visual field were full on confrontational test. Facial sensation and strength were normal. Uvula tongue midline. Head turning and shoulder shrug  were normal and symmetric. Motor: The motor testing reveals 5 over 5 strength of all 4 extremities. Good symmetric motor tone is noted throughout.  Sensory: Sensory testing is intact to soft touch on all 4 extremities. No evidence of extinction is noted.  Coordination: Cerebellar testing reveals good finger-nose-finger and heel-to-shin bilaterally.  Gait and station: Gait is normal. Tandem gait is normal.  Reflexes: Deep  tendon reflexes are symmetric and normal bilaterally.   DIAGNOSTIC DATA (LABS, IMAGING, TESTING) - I reviewed patient records, labs, notes, testing and imaging myself where available.  Lab Results  Component Value Date   WBC 8.9 11/03/2017   HGB 14.4 11/03/2017   HCT 42.6 11/03/2017   MCV 88.9 11/03/2017   PLT 241 11/03/2017      Component Value Date/Time  NA 139 11/03/2017 0801   K 4.3 11/03/2017 0801   CL 105 11/03/2017 0801   CO2 25 11/03/2017 0801   GLUCOSE 96 11/03/2017 0801   BUN 13 11/03/2017 0801   CREATININE 0.60 11/03/2017 0801   CALCIUM 9.3 11/03/2017 0801   PROT 6.9 11/03/2017 0801   ALBUMIN 3.9 09/16/2014 0808   AST 20 11/03/2017 0801   ALT 27 11/03/2017 0801   ALKPHOS 62 09/16/2014 0808   BILITOT 0.5 11/03/2017 0801   GFRNONAA 106 11/03/2017 0801   GFRAA 123 11/03/2017 0801   Lab Results  Component Value Date   CHOL 202 (H) 11/03/2017   HDL 44 (L) 11/03/2017   LDLCALC 126 (H) 11/03/2017   TRIG 198 (H) 11/03/2017   CHOLHDL 4.6 11/03/2017   No results found for: HGBA1C No results found for: VITAMINB12 Lab Results  Component Value Date   TSH 1.824 09/24/2011      ASSESSMENT AND PLAN 53 y.o. year old female  has a past medical history of ALLERGIC RHINITIS CAUSE UNSPECIFIED (08/03/2008), Headache (09/30/2013), Obstructive sleep apnea 7cm water pressure CPAP (10/24/2010), Osteoarthritis of left knee (10/26/2013), and Rosacea (09/13/2010). here with:  1. Episodic headache -Headaches stable -Continue Inderal 20 mg twice daily, can take extra propanolol with postcoital headache since this helps -HR 57 today, wouldn't go higher on dose now -Possible triptan nasal spray in future for abortive therapy if needed  2. Abnormal MRI of the brain  -MRI of the brian in 2017, unchanged enlargement of the right lateral ventricle. No new abnormality. -Condition is stable, no new symptoms  She will follow-up in 1 year with Dr. Terrace ArabiaYan   I spent 15 minutes with the  patient. 50% of this time was spent discussing her plan of care.   Margie EgeSarah Slack, AGNP-C, DNP 10/08/2018, 9:35 AM [facility] [address], [address] [place], [place] [phone]

## 2019-01-08 ENCOUNTER — Other Ambulatory Visit: Payer: Self-pay | Admitting: Family Medicine

## 2019-01-26 ENCOUNTER — Other Ambulatory Visit: Payer: Self-pay

## 2019-01-26 ENCOUNTER — Ambulatory Visit (INDEPENDENT_AMBULATORY_CARE_PROVIDER_SITE_OTHER): Payer: BC Managed Care – PPO

## 2019-01-26 ENCOUNTER — Ambulatory Visit: Payer: BC Managed Care – PPO | Admitting: Sports Medicine

## 2019-01-26 DIAGNOSIS — M1712 Unilateral primary osteoarthritis, left knee: Secondary | ICD-10-CM

## 2019-01-26 DIAGNOSIS — M1812 Unilateral primary osteoarthritis of first carpometacarpal joint, left hand: Secondary | ICD-10-CM | POA: Diagnosis not present

## 2019-01-26 MED ORDER — MELOXICAM 15 MG PO TABS: ORAL_TABLET | ORAL | 3 refills | Status: DC

## 2019-01-26 NOTE — Progress Notes (Addendum)
Subjective:    CC: Left knee pain  HPI: Olie is a pleasant 53 year old female, she is post partial meniscectomy about 5 years ago with Dr. Berenice Primas, lately she has had increasing pain and swelling in the knee, moderate, persistent, localized without radiation, no trauma, no mechanical symptoms.  In addition she has pain in her hand, left thumb basal joint, moderate, persistent, localized without radiation, desires interventional treatment here as well.  I reviewed the past medical history, family history, social history, surgical history, and allergies today and no changes were needed.  Please see the problem list section below in epic for further details.  Past Medical History: Past Medical History:  Diagnosis Date  . ALLERGIC RHINITIS CAUSE UNSPECIFIED 08/03/2008   Qualifier: Diagnosis of  By: Esmeralda Arthur    . Headache 09/30/2013  . Obstructive sleep apnea 7cm water pressure CPAP 10/24/2010   Moderate. Sleep study was performed at [facility] clinic by Dr. Pecolia Ades on 10/02/2010. AHI was 19.8 with desaturations to 79%. Frequent severe snoring. Moderate wedge works associated with respiratory events. Difficulty initiating and maintaining sleep with no slow wave sleep. She had severe OSA and REM.   . Osteoarthritis of left knee 10/26/2013  . Rosacea 09/13/2010   Past Surgical History: Past Surgical History:  Procedure Laterality Date  . bone spur removal Right 02/17/2012   foot  . bone spur removal  08/2015  . HERNIA REPAIR    . KNEE ARTHROSCOPY Left 03/16/14   [facility]   . tonsillectome and adnoidectomy    . TUBAL LIGATION     Social History: Social History   Socioeconomic History  . Marital status: Married    Spouse name: Jeneen Rinks  . Number of children: 2  . Years of education: college  . Highest education level: Not on file  Occupational History    Employer: [place]  Social Needs  . Financial resource  strain: Not on file  . Food insecurity    Worry: Not on file    Inability: Not on file  . Transportation needs    Medical: Not on file    Non-medical: Not on file  Tobacco Use  . Smoking status: Never Smoker  . Smokeless tobacco: Never Used  Substance and Sexual Activity  . Alcohol use: Yes    Alcohol/week: 1.0 standard drinks    Types: 1 Glasses of wine per week    Comment: rare  . Drug use: No  . Sexual activity: Not on file    Comment: cake decorator, 2 yrs college, married, 2 children, 4 sodas daily, no regular exercise  Lifestyle  . Physical activity    Days per week: Not on file    Minutes per session: Not on file  . Stress: Not on file  Relationships  . Social Herbalist on phone: Not on file    Gets together: Not on file    Attends religious service: Not on file    Active member of club or organization: Not on file    Attends meetings of clubs or organizations: Not on file    Relationship status: Not on file  Other Topics Concern  . Not on file  Social History Narrative   Patient lives at home with her husband Jeneen Rinks)   Education two years of college education.   Patient works for [employer]. [employer]   Right handed.   Caffeine three cups of coke daily.  Family History: Family History  Problem Relation Age of Onset  . Arthritis Mother   . Fibromyalgia Mother   . Cancer Mother        liver small intestine carcinoid tumor   Allergies: Allergies  Allergen Reactions  . Eggs Or Egg-Derived Products   . Sulfamethoxazole-Trimethoprim     REACTION: Hives  . Tdap [Tetanus-Diphth-Acell Pertussis] Other (See Comments)    Skin reaction with lymph node sweeling   Medications: See med rec.  Review of Systems: No fevers, chills, night sweats, weight loss, chest pain, or shortness of breath.   Objective:    General: Well Developed, well nourished, and in no acute distress.  Neuro: Alert and oriented x3, extra-ocular muscles intact, sensation  grossly intact.  HEENT: Normocephalic, atraumatic, pupils equal round reactive to light, neck supple, no masses, no lymphadenopathy, thyroid nonpalpable.  Skin: Warm and dry, no rashes. Cardiac: Regular rate and rhythm, no murmurs rubs or gallops, no lower extremity edema.  Respiratory: Clear to auscultation bilaterally. Not using accessory muscles, speaking in full sentences. Left knee: Swollen, palpable fluid wave with an effusion. Tenderness at the joint lines. ROM normal in flexion and extension and lower leg rotation. Ligaments with solid consistent endpoints including ACL, PCL, LCL, MCL. Negative Mcmurray's and provocative meniscal tests. Non painful patellar compression. Patellar and quadriceps tendons unremarkable. Hamstring and quadriceps strength is normal. Left hand: Squared off appearance with tenderness at the thumb basal joint as is typical with [facility]MC thumb basal joint arthritis.  Procedure: Real-time Ultrasound Guided  last injection of left knee Device: GE Logiq E  Verbal informed consent obtained.  Time-out conducted.  Noted no overlying erythema, induration, or other signs of local infection.  Skin prepped in a sterile fashion.  Local anesthesia: Topical Ethyl chloride.  With sterile technique and under real time ultrasound guidance:  Using an 18-gauge needle aspirated 20 cc of clear, straw-colored fluid, syringe switched and 1 cc Kenalog 40, 2 cc lidocaine, 2 cc bupivacaine injected easily Completed without difficulty  Pain immediately resolved suggesting accurate placement of the medication.  Advised to call if fevers/chills, erythema, induration, drainage, or persistent bleeding.  Images permanently stored and available for review in the ultrasound unit.  Impression: Technically successful ultrasound guided injection.  Procedure: Real-time Ultrasound Guided injection of the left thumb basal joint Device: GE Logiq E  Verbal informed consent obtained.  Time-out  conducted.  Noted no overlying erythema, induration, or other signs of local infection.  Skin prepped in a sterile fashion.  Local anesthesia: Topical Ethyl chloride.  With sterile technique and under real time ultrasound guidance:  1/2 cc Kenalog 40, 1/2 cc lidocaine injected easily Completed without difficulty  Pain immediately resolved suggesting accurate placement of the medication.  Advised to call if fevers/chills, erythema, induration, drainage, or persistent bleeding.  Images permanently stored and available for review in the ultrasound unit.  Impression: Technically successful ultrasound guided injection.  Impression and Recommendations:    Primary osteoarthritis of left knee History of partial meniscectomy about 5 years ago. Now with recurrence of pain, effusion. Aspiration and injection, x-rays, formal PT. Fluid was a touch cloudy, adding crystal analysis. Return to see me in 4 weeks.  Primary osteoarthritis of first carpometacarpal joint of left hand Per patient request injection as above, adding x-rays, formal PT. Meloxicam, return in a month.   ___________________________________________ Ihor Austin. Benjamin Stain, M.D., ABFM., CAQSM. Primary Care and Sports Medicine [facility]  Adjunct Professor of [facility] Medicine  [employer]

## 2019-01-26 NOTE — Assessment & Plan Note (Signed)
Per patient request injection as above, adding x-rays, formal PT. Meloxicam, return in a month.

## 2019-01-26 NOTE — Addendum Note (Signed)
Addended by: Silverio Decamp on: 01/26/2019 04:17 PM   Modules accepted: Orders

## 2019-01-26 NOTE — Assessment & Plan Note (Addendum)
History of partial meniscectomy about 5 years ago. Now with recurrence of pain, effusion. Aspiration and injection, x-rays, formal PT. Fluid was a touch cloudy, adding crystal analysis. Return to see me in 4 weeks.

## 2019-01-27 LAB — SYNOVIAL FLUID, CRYSTAL

## 2019-02-12 ENCOUNTER — Other Ambulatory Visit: Payer: Self-pay

## 2019-02-12 ENCOUNTER — Encounter: Payer: Self-pay | Admitting: Rehabilitative and Restorative Service Providers"

## 2019-02-12 ENCOUNTER — Ambulatory Visit (INDEPENDENT_AMBULATORY_CARE_PROVIDER_SITE_OTHER): Payer: BC Managed Care – PPO | Admitting: Rehabilitative and Restorative Service Providers"

## 2019-02-12 DIAGNOSIS — M25562 Pain in left knee: Secondary | ICD-10-CM | POA: Diagnosis not present

## 2019-02-12 DIAGNOSIS — M6281 Muscle weakness (generalized): Secondary | ICD-10-CM

## 2019-02-12 NOTE — Patient Instructions (Signed)
Access Code: [email or web address]  URL: [email or web address]  Date: 02/12/2019  Prepared by: Rudell Cobb   Exercises Seated Hamstring Stretch - 2-3 reps - 1 sets - 30 seconds hold - 2x daily - 7x weekly Gastroc Stretch on Wall - 2-3 reps - 1 sets - 30 seconds hold - 2x daily - 7x weekly Active Straight Leg Raise with Quad Set - 12 reps - 1 sets - 2x daily - 7x weekly

## 2019-02-12 NOTE — Therapy (Signed)
[facility] [place] [address] [place], [place], [address] Phone: [phone]   Fax:  [phone]  Physical Therapy Evaluation  Patient Details  Name: Briana Parks MRN: [id number] Date of Birth: [birth year] Referring Provider (PT): Monica Becton, MD   Encounter Date: 02/12/2019  PT End of Session - 02/12/19 1629    Visit Number  1    Number of Visits  12    Date for PT Re-Evaluation  03/29/19    Authorization Type  BCBS state    PT Start Time  1530    PT Stop Time  1622    PT Time Calculation (min)  52 min       Past Medical History:  Diagnosis Date  . ALLERGIC RHINITIS CAUSE UNSPECIFIED 08/03/2008   Qualifier: Diagnosis of  By: Thomos Lemons    . Headache 09/30/2013  . Obstructive sleep apnea 7cm water pressure CPAP 10/24/2010   Moderate. Sleep study was performed at [facility] neurological clinic by Dr. Marliss Coots on 10/02/2010. AHI was 19.8 with desaturations to 79%. Frequent severe snoring. Moderate wedge works associated with respiratory events. Difficulty initiating and maintaining sleep with no slow wave sleep. She had severe OSA and REM.   . Osteoarthritis of left knee 10/26/2013  . Rosacea 09/13/2010    Past Surgical History:  Procedure Laterality Date  . bone spur removal Right 02/17/2012   foot  . bone spur removal  08/2015  . HERNIA REPAIR    . KNEE ARTHROSCOPY Left 03/16/14   [facility]   . tonsillectome and adnoidectomy    . TUBAL LIGATION      There were no vitals filed for this visit.   Subjective Assessment - 02/12/19 1533    Subjective  The patient reports L knee pain beginning 2 weeks ago with insidious onset of "tightness, pain".  She tried ice and elevation without relief of pain.  "It feels like someone is taking ice picks and hitting the knee."  She had fluid removed from the knee, but is continuing with sharp pain.  She reports she had a sensation of "knee going  out" in the medial aspect of her knee.    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  Yes    Pain Score  1    9/10 when it is sharp, stabbing   Pain Location  Knee    Pain Orientation  Left;Medial   and in the knee cap   Pain Descriptors / Indicators  Stabbing;Sharp;Dull;Throbbing   constant dull/throb, increases to sharp/stabbing   Pain Type  Acute pain    Pain Onset  1 to 4 weeks ago    Pain Frequency  Intermittent    Aggravating Factors   varies in intensity from dull up to stabbing, sharp pain    Pain Relieving Factors  nothing         [facility] PT Assessment - 02/12/19 1544      Assessment   Medical Diagnosis  M17.12 (ICD-10-CM) - Primary osteoarthritis of left knee    Referring Provider (PT)  Monica Becton, MD    Prior Therapy  none      Precautions   Precautions  None      Restrictions   Weight Bearing Restrictions  No  Balance Screen   Has the patient fallen in the past 6 months  No    Has the patient had a decrease in activity level because of a fear of falling?   No    Is the patient reluctant to leave their home because of a fear of falling?   No      Home Environment   Living Environment  Private residence    Living Arrangements  Spouse/significant other    Type of Home  House    Home Access  Stairs to enter    Entrance Stairs-Number of Steps  2    Entrance Stairs-Rails  --   only hurts when it is tight and swollen   Home Layout  One level    Home Equipment  None      Prior Function   Level of Independence  Independent      Observation/Other Assessments   Focus on Therapeutic Outcomes (FOTO)   77%      Observation/Other Assessments-Edema    Edema  Circumferential      Circumferential Edema   Circumferential - Right  15"   3" above superior patella=16.5"    Circumferential - Left   16"   3" above superior patella=17.5"     Sensation   Light Touch  Appears Intact      ROM / Strength   AROM / PROM / Strength  AROM;Strength      AROM   AROM Assessment Site  Knee    Right/Left Knee  Right;Left    Right Knee Extension  0    Right Knee Flexion  118    Left Knee Extension  2    Left Knee Flexion  110      Strength   Strength Assessment Site  Knee;Hip;Ankle    Right/Left Hip  Right;Left    Right Hip Flexion  5/5    Left Hip Flexion  4+/5    Right/Left Knee  Right;Left    Right Knee Flexion  5/5    Right Knee Extension  5/5    Left Knee Flexion  4+/5    Left Knee Extension  4+/5    Right/Left Ankle  Right;Left    Right Ankle Dorsiflexion  5/5    Left Ankle Dorsiflexion  5/5      Flexibility   Soft Tissue Assessment /Muscle Length  yes    Hamstrings  tightness bilaterally to approximately 25 degree flexion (beginning 90/90 hip + knee flexion)    Quadriceps  tightness L >R with discomfort knee joint line + stretching in quad sensation    ITB  significant tightness L side with stretch reproducing medial knee pain      Palpation   Patella mobility  hypomobility L patella    Palpation comment  tenderness to palpation along IT Band      Special Tests    Special Tests  Knee Special Tests    Knee Special tests   Patellofemoral Apprehension Test;Patellofemoral Grind Test (Clarke's Sign)      Patellofemoral Apprehension Test    Findings  Positive    Side   Left      Patellofemoral Grind test (Clark's Sign)   Findings  Negative    Side   Left                Objective measurements completed on examination: See above findings.      [facility] Adult PT Treatment/Exercise - 02/12/19 1656      Exercises  Exercises  Knee/Hip      Knee/Hip Exercises: Stretches   Active Hamstring Stretch  Left;30 seconds;2 reps;Right    Gastroc Stretch  Right;Left;2 reps;30 seconds      Knee/Hip Exercises: Supine   Quad Sets   Strengthening;Left;10 reps    Straight Leg Raises  Strengthening;Left;10 reps      Manual Therapy   Manual Therapy  Soft tissue mobilization    Manual therapy comments  In right sidelying    Soft tissue mobilization  L IASTM IT Band              PT Education - 02/12/19 1615    Education Details  HEP    Person(s) Educated  Patient    Methods  Explanation;Demonstration;Handout    Comprehension  Verbalized understanding;Returned demonstration          PT Long Term Goals - 02/12/19 1642      PT LONG TERM GOAL #1   Title  The patient will be indep with HEP.    Time  6    Period  Weeks    Target Date  03/29/19      PT LONG TERM GOAL #2   Title  The patient will report resolution of "stabbing" knee pain L side.    Time  6    Period  Weeks    Target Date  03/29/19      PT LONG TERM GOAL #3   Title  The patient will be report < 18% limitation per FOTO functional status survey to demonstrate improved functional use of LE.    Time  6    Period  Weeks    Target Date  03/29/19      PT LONG TERM GOAL #4   Title  The patient will report 0/10 baseline pain in L knee with walking.    Time  6    Period  Weeks    Target Date  03/29/19      PT LONG TERM GOAL #5   Title  Improve L knee AROM 0-115 degrees.    Baseline  2-110    Time  6    Period  Weeks    Target Date  03/29/19             Plan - 02/12/19 1658    Clinical Impression Statement  The patient is a 53 year old female presenting to outpatient physical therapy with L knee pain that began 2 weeks ago.  She presents with mild decrease in L LE strength, decreased L knee AROM, decreased flexibility hamstring/IT band/ quadriceps, and pain L knee.  PT to address deficits to optimize current functional status.    Personal Factors and Comorbidities  Comorbidity 1    Comorbidities  spondylolisthesis    Examination-Activity Limitations  Locomotion Level;Squat;Stairs    Stability/Clinical Decision Making   Stable/Uncomplicated    Clinical Decision Making  Low    Rehab Potential  Good    PT Frequency  2x / week    PT Duration  6 weeks    PT Treatment/Interventions  ADLs/Self Care Home Management;Functional mobility training;Gait training;Stair training;Therapeutic activities;Therapeutic exercise;Balance training;Cryotherapy;Iontophoresis 4mg /ml Dexamethasone;Moist Heat;Ultrasound;Manual techniques;Taping;Neuromuscular re-education;Dry needling    PT Next Visit Plan  check current HEP, add IT Band stretch (may have to prop with pillows to work towards a deeper stretch), dry needling L IT band, patellar taping, patellar mobilization, and LE strengthening/stretching.    PT Home Exercise Plan  Access Code: [email or web address]    Consulted and  Agree with Plan of Care  Patient       Patient will benefit from skilled therapeutic intervention in order to improve the following deficits and impairments:  Decreased strength, Decreased range of motion, Pain, Impaired flexibility  Visit Diagnosis: Acute pain of left knee  Muscle weakness (generalized)     Problem List Patient Active Problem List   Diagnosis Date Noted  . Primary osteoarthritis of first carpometacarpal joint of left hand 01/26/2019  . Abnormal MRI of head 10/03/2015  . Primary osteoarthritis of left knee 10/26/2013  . Headache 09/30/2013  . Obstructive sleep apnea 7cm water pressure CPAP 10/24/2010  . Rosacea 09/13/2010  . ALLERGIC RHINITIS CAUSE UNSPECIFIED 08/03/2008  . SPONDYLOLISTHESIS, ACQUIRED 05/21/2006  . SCOLIOSIS 12/31/2005  . SHOULDER/ARM SPRAIN/STRAIN, UNSPEC. 12/31/2005    Amandalee Lacap, PT 02/12/2019, 5:06 PM  [facility] [address] [place], [place], [address] Phone: [phone]   Fax:  [phone]  Name: Briana Parks MRN: [id number] Date of Birth: [birth year]

## 2019-02-22 ENCOUNTER — Ambulatory Visit: Payer: BC Managed Care – PPO | Admitting: Physical Therapy

## 2019-02-22 ENCOUNTER — Other Ambulatory Visit: Payer: Self-pay

## 2019-02-22 ENCOUNTER — Encounter: Payer: Self-pay | Admitting: Physical Therapy

## 2019-02-22 DIAGNOSIS — M25562 Pain in left knee: Secondary | ICD-10-CM | POA: Diagnosis not present

## 2019-02-22 DIAGNOSIS — M6281 Muscle weakness (generalized): Secondary | ICD-10-CM | POA: Diagnosis not present

## 2019-02-22 NOTE — Therapy (Signed)
[place], [place], [address] Phone: [phone]   Fax:  [phone]  Physical Therapy Treatment  Patient Details  Name: Briana Parks MRN: [id number] Date of Birth: Jul 25, 1965 Referring Provider (PT): Silverio Decamp, MD   Encounter Date: 02/22/2019  PT End of Session - 02/22/19 1551    Visit Number  2    Number of Visits  12    Date for PT Re-Evaluation  03/29/19    Authorization Type  BCBS state    PT Start Time  1540    PT Stop Time  1620    PT Time Calculation (min)  40 min    Activity Tolerance  Patient tolerated treatment well    Behavior During Therapy  [facility] for tasks assessed/performed       Past Medical History:  Diagnosis Date  . ALLERGIC RHINITIS CAUSE UNSPECIFIED 08/03/2008   Qualifier: Diagnosis of  By: Esmeralda Arthur    . Headache 09/30/2013  . Obstructive sleep apnea 7cm water pressure CPAP 10/24/2010   Moderate. Sleep study was performed at [facility] clinic by Dr. Pecolia Ades on 10/02/2010. AHI was 19.8 with desaturations to 79%. Frequent severe snoring. Moderate wedge works associated with respiratory events. Difficulty initiating and maintaining sleep with no slow wave sleep. She had severe OSA and REM.   . Osteoarthritis of left knee 10/26/2013  . Rosacea 09/13/2010    Past Surgical History:  Procedure Laterality Date  . bone spur removal Right 02/17/2012   foot  . bone spur removal  08/2015  . HERNIA REPAIR    . KNEE ARTHROSCOPY Left 03/16/14   [facility]   . tonsillectome and adnoidectomy    . TUBAL LIGATION      There were no vitals filed for this visit.  Subjective Assessment - 02/22/19 1549    Subjective  Pt arriving to therapy today reporting 1/10 at rest. Pt reporting her knee is "always a dull throb". Pt reporting doing her HEP from last visit.    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  Yes    Pain Score  1     Pain Location  Knee    Pain Orientation  Left;Medial    Pain Descriptors / Indicators  Throbbing    Pain Type  Acute pain    Pain Onset  1 to 4 weeks ago    Pain Frequency  Intermittent                       [facility] Adult PT Treatment/Exercise - 02/22/19 0001      Exercises   Exercises  Knee/Hip      Knee/Hip Exercises: Stretches   Active Hamstring Stretch  Left;30 seconds;2 reps;Right    Gastroc Stretch  Right;Left;2 reps;30 seconds      Knee/Hip Exercises: Seated   Hamstring Curl  Strengthening;Left;2 sets;10 reps;Limitations    Hamstring Limitations  green theraband      Knee/Hip Exercises: Supine   Quad Sets  Strengthening;Left;10 reps    Bridges  Strengthening;Both;10 reps    Bridges with [employer]  Strengthening;10 reps    Straight Leg Raises  Strengthening;Left;10 reps    Straight Leg Raise with External Rotation  Strengthening;Left;2 sets;10 reps  Manual Therapy   Manual Therapy  Soft tissue mobilization    Manual therapy comments  In right sidelying    Soft tissue mobilization  L IASTM IT Band, Soft tissue work to medial quad using biofreeze                  PT Long Term Goals - 02/22/19 1554      PT LONG TERM GOAL #1   Title  The patient will be indep with HEP.    Time  6    Period  Weeks    Status  On-going      PT LONG TERM GOAL #2   Title  The patient will report resolution of "stabbing" knee pain L side.    Time  6    Period  Weeks    Status  On-going      PT LONG TERM GOAL #3   Title  The patient will be report < 18% limitation per FOTO functional status survey to demonstrate improved functional use of LE.    Time  6    Period  Weeks    Status  On-going      PT LONG TERM GOAL #4   Title  The patient will report 0/10 baseline pain in L  knee with walking.    Time  6    Period  Weeks    Status  Partially Met      PT LONG TERM GOAL #5   Title  Improve L knee AROM 0-115 degrees.    Baseline  2-110    Time  6    Period  Weeks    Status  On-going            Plan - 02/22/19 1551    Clinical Impression Statement  Pt tolerating treatment well, pt presenting with decreased ROM and strength in L LE. Pt reporting no  increase in pain during session. Reviewed pt's HEP. Contiue with skilled PT to progress toward goals set.    Personal Factors and Comorbidities  Comorbidity 1    Comorbidities  spondylolisthesis    Examination-Activity Limitations  Locomotion Level;Squat;Stairs    Stability/Clinical Decision Making  Stable/Uncomplicated    Rehab Potential  Good    PT Frequency  2x / week    PT Duration  6 weeks    PT Treatment/Interventions  ADLs/Self Care Home Management;Functional mobility training;Gait training;Stair training;Therapeutic activities;Therapeutic exercise;Balance training;Cryotherapy;Iontophoresis '4mg'$ /ml Dexamethasone;Moist Heat;Ultrasound;Manual techniques;Taping;Neuromuscular re-education;Dry needling    PT Next Visit Plan  check current HEP, add IT Band stretch (may have to prop with pillows to work towards a deeper stretch), dry needling L IT band, patellar taping, patellar mobilization, and LE strengthening/stretching.    PT Home Exercise Plan  Access Code: [email or web address]    Consulted and Agree with Plan of Care  Patient       Patient will benefit from skilled therapeutic intervention in order to improve the following deficits and impairments:  Decreased strength, Decreased range of motion, Pain, Impaired flexibility  Visit Diagnosis: Acute pain of left knee  Muscle weakness (generalized)     Problem List Patient Active Problem List   Diagnosis Date Noted  . Primary osteoarthritis of first carpometacarpal joint of left hand 01/26/2019  . Abnormal MRI of head 10/03/2015  . Primary osteoarthritis of  left knee 10/26/2013  . Headache 09/30/2013  . Obstructive sleep apnea 7cm water pressure CPAP 10/24/2010  . Rosacea 09/13/2010  . ALLERGIC RHINITIS CAUSE UNSPECIFIED 08/03/2008  . SPONDYLOLISTHESIS,  ACQUIRED 05/21/2006  . SCOLIOSIS 12/31/2005  . SHOULDER/ARM SPRAIN/STRAIN, UNSPEC. 12/31/2005    Oretha Caprice, PT 02/22/2019, 4:25 PM  [facility] [address], [place], [address] Phone: [phone]   Fax:  [phone]  Name: Briana Parks MRN: [id number] Date of Birth: 16-Oct-1965

## 2019-02-23 ENCOUNTER — Ambulatory Visit: Payer: BC Managed Care – PPO | Admitting: Sports Medicine

## 2019-02-23 DIAGNOSIS — M1712 Unilateral primary osteoarthritis, left knee: Secondary | ICD-10-CM

## 2019-02-23 DIAGNOSIS — M1812 Unilateral primary osteoarthritis of first carpometacarpal joint, left hand: Secondary | ICD-10-CM

## 2019-02-23 NOTE — Assessment & Plan Note (Signed)
Not much better after steroid injection, getting her approved for viscosupplementation. Continue meloxicam, she is post partial meniscectomy about 5 years ago. Crystal analysis was negative.

## 2019-02-23 NOTE — Progress Notes (Signed)
Subjective:    CC: Follow-up  HPI: This is a pleasant 53 year old female, we did a left CMC injection, she had no relief.  She also has left knee pain, history of partial meniscectomy, knee osteoarthritis, steroid injection at the last visit also did not provide much relief, meloxicam has helped her back but not her hand or her knee very much.  I reviewed the past medical history, family history, social history, surgical history, and allergies today and no changes were needed.  Please see the problem list section below in epic for further details.  Past Medical History: Past Medical History:  Diagnosis Date  . ALLERGIC RHINITIS CAUSE UNSPECIFIED 08/03/2008   Qualifier: Diagnosis of  By: Thomos Lemons    . Headache 09/30/2013  . Obstructive sleep apnea 7cm water pressure CPAP 10/24/2010   Moderate. Sleep study was performed at [facility] neurological clinic by Dr. Marliss Coots on 10/02/2010. AHI was 19.8 with desaturations to 79%. Frequent severe snoring. Moderate wedge works associated with respiratory events. Difficulty initiating and maintaining sleep with no slow wave sleep. She had severe OSA and REM.   . Osteoarthritis of left knee 10/26/2013  . Rosacea 09/13/2010   Past Surgical History: Past Surgical History:  Procedure Laterality Date  . bone spur removal Right 02/17/2012   foot  . bone spur removal  08/2015  . HERNIA REPAIR    . KNEE ARTHROSCOPY Left 03/16/14   [facility]   . tonsillectome and adnoidectomy    . TUBAL LIGATION     Social History: Social History   Socioeconomic History  . Marital status: Married    Spouse name: Fayrene Fearing  . Number of children: 2  . Years of education: college  . Highest education level: Not on file  Occupational History    Employer: [employer]    Comment: [employer]  Social Needs  . Financial resource strain: Not on file  . Food insecurity    Worry: Not on file    Inability: Not on file  . Transportation needs     Medical: Not on file    Non-medical: Not on file  Tobacco Use  . Smoking status: Never Smoker  . Smokeless tobacco: Never Used  Substance and Sexual Activity  . Alcohol use: Yes    Alcohol/week: 1.0 standard drinks    Types: 1 Glasses of wine per week    Comment: rare  . Drug use: No  . Sexual activity: Not on file    Comment: cake decorator, 2 yrs college, married, 2 children, 4 sodas daily, no regular exercise  Lifestyle  . Physical activity    Days per week: Not on file    Minutes per session: Not on file  . Stress: Not on file  Relationships  . Social Musician on phone: Not on file    Gets together: Not on file    Attends religious service: Not on file    Active member of club or organization: Not on file    Attends meetings of clubs or organizations: Not on file    Relationship status: Not on file  Other Topics Concern  . Not on file  Social History Narrative   Patient lives at home with her husband Fayrene Fearing)   Education two years of college education.   Patient works for [employer]. [employer]   Right handed.   Caffeine three cups of coke daily.         Family History: Family History  Problem Relation Age of Onset  . Arthritis Mother   . Fibromyalgia Mother   . Cancer Mother        liver small intestine carcinoid tumor   Allergies: Allergies  Allergen Reactions  . Eggs Or Egg-Derived Products   . Sulfamethoxazole-Trimethoprim     REACTION: Hives  . Tdap [Tetanus-Diphth-Acell Pertussis] Other (See Comments)    Skin reaction with lymph node sweeling   Medications: See med rec.  Review of Systems: No fevers, chills, night sweats, weight loss, chest pain, or shortness of breath.   Objective:    General: Well Developed, well nourished, and in no acute distress.  Neuro: Alert and oriented x3, extra-ocular muscles intact, sensation grossly intact.  HEENT: Normocephalic, atraumatic, pupils equal round reactive to light, neck supple, no masses, no  lymphadenopathy, thyroid nonpalpable.  Skin: Warm and dry, no rashes. Cardiac: Regular rate and rhythm, no murmurs rubs or gallops, no lower extremity edema.  Respiratory: Clear to auscultation bilaterally. Not using accessory muscles, speaking in full sentences.  Impression and Recommendations:    Primary osteoarthritis of first carpometacarpal joint of left hand Really not much better, we are adding some hand therapy, if she feels over the next month or so we will proceed with referral to hand surgery  Primary osteoarthritis of left knee Not much better after steroid injection, getting her approved for viscosupplementation. Continue meloxicam, she is post partial meniscectomy about 5 years ago. Crystal analysis was negative.   ___________________________________________ Gwen Her. Dianah Field, M.D., ABFM., CAQSM. Primary Care and Sports Medicine [facility]  Adjunct Professor of [place] [employer] [facility] [employer]

## 2019-02-23 NOTE — Assessment & Plan Note (Signed)
Really not much better, we are adding some hand therapy, if she feels over the next month or so we will proceed with referral to hand surgery

## 2019-02-25 ENCOUNTER — Encounter: Payer: BC Managed Care – PPO | Admitting: Rehabilitative and Restorative Service Providers"

## 2019-02-26 ENCOUNTER — Ambulatory Visit (INDEPENDENT_AMBULATORY_CARE_PROVIDER_SITE_OTHER): Payer: BC Managed Care – PPO | Admitting: Rehabilitative and Restorative Service Providers"

## 2019-02-26 ENCOUNTER — Encounter: Payer: Self-pay | Admitting: Rehabilitative and Restorative Service Providers"

## 2019-02-26 ENCOUNTER — Other Ambulatory Visit: Payer: Self-pay

## 2019-02-26 DIAGNOSIS — M6281 Muscle weakness (generalized): Secondary | ICD-10-CM

## 2019-02-26 DIAGNOSIS — M25562 Pain in left knee: Secondary | ICD-10-CM | POA: Diagnosis not present

## 2019-02-26 NOTE — Patient Instructions (Signed)
Access Code: [email or web address]  URL: [email or web address]  Date: 02/26/2019  Prepared by: Rudell Cobb   Exercises Seated Hamstring Stretch - 2-3 reps - 1 sets - 30 seconds hold - 2x daily - 7x weekly Gastroc Stretch on Wall - 2-3 reps - 1 sets - 30 seconds hold - 2x daily - 7x weekly Prone Quadriceps Stretch with Strap - 3 reps - 1 sets - 30 seconds hold - 2x daily - 7x weekly Active Straight Leg Raise with Quad Set - 12 reps - 1 sets - 2x daily - 7x weekly Supine Bridge - 10-15 reps - 2 sets - 5 seconds hold - 2x daily - 7x weekly Sidelying Hip Abduction - 10 reps - 2 sets - 3-5 seconds hold - 2x daily - 7x weekly Supine ITB Stretch with Strap - 10 reps - 3 sets - 2x daily - 7x weekly Sidelying TFL Stretch - 3 reps - 1 sets - 30 seconds hold - 2x daily                            - 7x weekly Seated Figure 4 Piriformis Stretch - 3 reps - 1 sets - 30 seconds hold - 2x daily - 7x weekly

## 2019-02-26 NOTE — Therapy (Signed)
[place], [place], [address] Phone: [phone]   Fax:  [phone]  Physical Therapy Treatment  Patient Details  Name: Briana Parks MRN: [id number] Date of Birth: January 14, 1966 Referring Provider (PT): Silverio Decamp, MD   Encounter Date: 02/26/2019  PT End of Session - 02/26/19 1453    Visit Number  3    Number of Visits  12    Date for PT Re-Evaluation  03/29/19    Authorization Type  BCBS state    PT Start Time  1448    PT Stop Time  1530    PT Time Calculation (min)  42 min    Activity Tolerance  Patient tolerated treatment well    Behavior During Therapy  [facility] for tasks assessed/performed       Past Medical History:  Diagnosis Date  . ALLERGIC RHINITIS CAUSE UNSPECIFIED 08/03/2008   Qualifier: Diagnosis of  By: Esmeralda Arthur    . Headache 09/30/2013  . Obstructive sleep apnea 7cm water pressure CPAP 10/24/2010   Moderate. Sleep study was performed at [facility] neurological clinic by Dr. Pecolia Ades on 10/02/2010. AHI was 19.8 with desaturations to 79%. Frequent severe snoring. Moderate wedge works associated with respiratory events. Difficulty initiating and maintaining sleep with no slow wave sleep. She had severe OSA and REM.   . Osteoarthritis of left knee 10/26/2013  . Rosacea 09/13/2010    Past Surgical History:  Procedure Laterality Date  . bone spur removal Right 02/17/2012   foot  . bone spur removal  08/2015  . HERNIA REPAIR    . KNEE ARTHROSCOPY Left 03/16/14   [facility]   . tonsillectome and adnoidectomy    . TUBAL LIGATION      There were no vitals filed for this visit.  Subjective Assessment - 02/26/19 1451    Subjective  The patient reports she is on her feet all day.  She had antalgic gait with first 10 feet of walking after sitting (rising in lobby).  The patient has not had sharp shooting pain for a few days.    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  Yes    Pain Score  --   "it just aches"   Pain Location  Knee    Pain Orientation  Left    Pain Descriptors / Indicators  Aching    Pain Type  Acute pain    Pain Onset  1 to 4 weeks ago    Pain Frequency  Intermittent    Aggravating Factors   achiness    Pain Relieving Factors  nothing                       [facility] Adult PT Treatment/Exercise - 02/26/19 1500      Ambulation/Gait   Ambulation/Gait  Yes    Ambulation/Gait Assistance  6: Modified independent (Device/Increase time)    Gait Comments  Patient has antalgic L gait when first rising.  She has decreased pelvic rotation in the transverse plane and slightly abducts L LE during gait.      Exercises   Exercises  Knee/Hip      Knee/Hip Exercises: Stretches   Sports administrator  Left;Right;30 seconds;3 Armed forces technical officer  began PROM and then provided belt for AAROM stretch.    ITB Stretch  Left;3 reps    ITB Stretch Limitations  Performed supine with hooklying legs crossed, supine with belt and sidelying.    Piriformis Stretch  Right;Left;2 reps    Piriformis Stretch Limitations  Performed in supine and sitting.      Knee/Hip Exercises: Aerobic   Nustep  level 3 x 5 minutes      Knee/Hip Exercises: Supine   Facilities manager;Both    Straight Leg Raises  Strengthening;Left;10 reps    Straight Leg Raise with External Rotation  --      Knee/Hip Exercises: Sidelying   Hip ABduction  Strengthening;Left;10 reps      Manual Therapy   Manual Therapy  Soft tissue mobilization    Manual therapy comments  In right sidelying    Soft tissue mobilization  L IASTM IT band, soft tissue work lateral quad/ITB,     Kinesiotex  Facilitate Muscle      Kinesiotix   Facilitate Muscle   for patellar tracing              PT Education - 02/26/19 1522    Education Details  HEP progression    Person(s) Educated  Patient    Methods  Explanation;Demonstration;Handout    Comprehension  Verbalized understanding;Returned demonstration          PT Long Term Goals - 02/26/19 1631      PT LONG TERM GOAL #1   Title  The patient will be indep with HEP.    Time  6    Period  Weeks    Status  On-going      PT LONG TERM GOAL #2   Title  The patient will report resolution of "stabbing" knee pain L side.    Baseline  Patient reports no further 'stabbing" episodes with L knee.    Time  6    Period  Weeks    Status  Achieved      PT LONG TERM GOAL #3   Title  The patient will be report < 18% limitation per FOTO functional status survey to demonstrate improved functional use of LE.    Time  6    Period  Weeks    Status  On-going      PT LONG TERM GOAL #4   Title  The patient will report 0/10 baseline pain in L knee with walking.    Time  6    Period  Weeks    Status  Partially Met      PT LONG TERM GOAL #5   Title  Improve L knee AROM 0-115 degrees.    Baseline  2-110    Time  6    Period  Weeks    Status  On-going            Plan - 02/26/19 1636    Clinical Impression Statement  The patient met LTG with reduction of shooting pain.  PT further developed HEP today emphasizing LE strengthening, stretching (adding quad, piriformis and ITB stretching).  Continue working to [employer] advancing LE strengthening and flexibility.    PT Treatment/Interventions  ADLs/Self Care Home Management;Functional mobility training;Gait training;Stair training;Therapeutic activities;Therapeutic exercise;Balance training;Cryotherapy;Iontophoresis 75m/ml Dexamethasone;Moist Heat;Ultrasound;Manual techniques;Taping;Neuromuscular re-education;Dry needling    PT Next Visit Plan  Inquire about taping, IT band stretching, soft tissue mobilization, dry needling L IT band, patellar taping, VMO strenghening.    PT  Home Exercise Plan  Access  Code: [email or web address]    Consulted and Agree with Plan of Care  Patient       Patient will benefit from skilled therapeutic intervention in order to improve the following deficits and impairments:  Decreased strength, Decreased range of motion, Pain, Impaired flexibility  Visit Diagnosis: Acute pain of left knee  Muscle weakness (generalized)     Problem List Patient Active Problem List   Diagnosis Date Noted  . Primary osteoarthritis of first carpometacarpal joint of left hand 01/26/2019  . Abnormal MRI of head 10/03/2015  . Primary osteoarthritis of left knee 10/26/2013  . Headache 09/30/2013  . Obstructive sleep apnea 7cm water pressure CPAP 10/24/2010  . Rosacea 09/13/2010  . ALLERGIC RHINITIS CAUSE UNSPECIFIED 08/03/2008  . SPONDYLOLISTHESIS, ACQUIRED 05/21/2006  . SCOLIOSIS 12/31/2005  . SHOULDER/ARM SPRAIN/STRAIN, UNSPEC. 12/31/2005    [place] , [place] 02/26/2019, 4:37 PM  [facility] [place], [place], [address] Phone: [phone]   Fax:  [phone]  Name: Briana Parks MRN: [id number] Date of Birth: [birth year]

## 2019-03-01 ENCOUNTER — Other Ambulatory Visit: Payer: Self-pay

## 2019-03-01 ENCOUNTER — Encounter: Payer: Self-pay | Admitting: Rehabilitative and Restorative Service Providers"

## 2019-03-01 ENCOUNTER — Ambulatory Visit (INDEPENDENT_AMBULATORY_CARE_PROVIDER_SITE_OTHER): Payer: BC Managed Care – PPO | Admitting: Rehabilitative and Restorative Service Providers"

## 2019-03-01 DIAGNOSIS — M6281 Muscle weakness (generalized): Secondary | ICD-10-CM

## 2019-03-01 DIAGNOSIS — M25562 Pain in left knee: Secondary | ICD-10-CM | POA: Diagnosis not present

## 2019-03-01 NOTE — Therapy (Signed)
[place], [place], [address] Phone: [phone]   Fax:  [phone]  Physical Therapy Treatment  Patient Details  Name: Briana Parks MRN: [id number] Date of Birth: 1965/10/27 Referring Provider (PT): Silverio Decamp, MD   Encounter Date: 03/01/2019  PT End of Session - 03/01/19 1522    Visit Number  4    Number of Visits  12    Date for PT Re-Evaluation  03/29/19    Authorization Type  BCBS state    PT Start Time  1518    PT Stop Time  1605    PT Time Calculation (min)  47 min    Activity Tolerance  Patient tolerated treatment well    Behavior During Therapy  [facility] for tasks assessed/performed       Past Medical History:  Diagnosis Date  . ALLERGIC RHINITIS CAUSE UNSPECIFIED 08/03/2008   Qualifier: Diagnosis of  By: Esmeralda Arthur    . Headache 09/30/2013  . Obstructive sleep apnea 7cm water pressure CPAP 10/24/2010   Moderate. Sleep study was performed at [facility] neurological clinic by Dr. Pecolia Ades on 10/02/2010. AHI was 19.8 with desaturations to 79%. Frequent severe snoring. Moderate wedge works associated with respiratory events. Difficulty initiating and maintaining sleep with no slow wave sleep. She had severe OSA and REM.   . Osteoarthritis of left knee 10/26/2013  . Rosacea 09/13/2010    Past Surgical History:  Procedure Laterality Date  . bone spur removal Right 02/17/2012   foot  . bone spur removal  08/2015  . HERNIA REPAIR    . KNEE ARTHROSCOPY Left 03/16/14   [facility]   . tonsillectome and adnoidectomy    . TUBAL LIGATION      There were no vitals filed for this visit.  Subjective Assessment - 03/01/19 1519    Subjective  Didn't ache as much with tape on, however very sore after therapy 3 days ago in distal IT band.    Pertinent History  L knee partial meniscectomy, sleep apnea, spondylolisthesis, h/o grand mal seizure (abnormal MRI--  enlarged R ventricle)    Patient Stated Goals  Get rid of the sharp pain    Currently in Pain?  No/denies    Pain Score  --   "not pain just sore"   Pain Location  Knee    Pain Orientation  Left    Pain Descriptors / Indicators  Sore    Pain Type  Acute pain    Pain Onset  1 to 4 weeks ago    Pain Frequency  Intermittent    Aggravating Factors   sore    Pain Relieving Factors  nothing                       [facility] Adult PT Treatment/Exercise - 03/01/19 1530      Ambulation/Gait   Ambulation/Gait  Yes    Ambulation/Gait Assistance  7: Independent    Gait Comments  PT emphasized gait with equal weight shift R And L sides, hip initiation with gait activities.      Exercises   Exercises  Knee/Hip      Knee/Hip Exercises: Stretches   Quad Stretch  Right;Left;1 rep;30 seconds    ITB Stretch  Left;2 reps;20 seconds      Knee/Hip Exercises: Aerobic   Recumbent Bike  level 3 x 3 minutes      Knee/Hip Exercises: Standing   Lateral  Step Up  Left;5 reps    Lateral Step Up Limitations  with patellar creptitus (audible)    Forward Step Up  Left;10 reps    Forward Step Up Limitations  with patellar creptitus (audible)    Wall Squat  10 reps    Wall Squat Limitations  with ball squeeze      Knee/Hip Exercises: Supine   Straight Leg Raise with External Rotation  Strengthening;Left;10 reps    Straight Leg Raise with External Rotation Limitations  10 reps, then 2 lbs x 10 reps      Knee/Hip Exercises: Sidelying   Hip ABduction  Strengthening;Left;10 reps    Hip ABduction Limitations  Adding 2 lb weight x 10 reps      Manual Therapy   Manual Therapy  Soft tissue mobilization    Manual therapy comments  prone and R sidelying    Soft tissue mobilization  L IASTM IT band and hamstring             PT Education - 03/01/19 2229    Education Details  HEP progression    Person(s) Educated  Patient    Methods  Demonstration;Handout;Explanation    Comprehension   Verbalized understanding;Returned demonstration          PT Long Term Goals - 02/26/19 1631      PT LONG TERM GOAL #1   Title  The patient will be indep with HEP.    Time  6    Period  Weeks    Status  On-going      PT LONG TERM GOAL #2   Title  The patient will report resolution of "stabbing" knee pain L side.    Baseline  Patient reports no further 'stabbing" episodes with L knee.    Time  6    Period  Weeks    Status  Achieved      PT LONG TERM GOAL #3   Title  The patient will be report < 18% limitation per FOTO functional status survey to demonstrate improved functional use of LE.    Time  6    Period  Weeks    Status  On-going      PT LONG TERM GOAL #4   Title  The patient will report 0/10 baseline pain in L knee with walking.    Time  6    Period  Weeks    Status  Partially Met      PT LONG TERM GOAL #5   Title  Improve L knee AROM 0-115 degrees.    Baseline  2-110    Time  6    Period  Weeks    Status  On-going            Plan - 03/01/19 2236    Clinical Impression Statement  Patient continuing to progress with decreasing pain.  She has some abduction of L LE from midline during gait, which may be contributing to shortened IT Band.  PT continuing to progress LE strengthening, stretching working to [employer].    PT Treatment/Interventions  ADLs/Self Care Home Management;Functional mobility training;Gait training;Stair training;Therapeutic activities;Therapeutic exercise;Balance training;Cryotherapy;Iontophoresis 56m/ml Dexamethasone;Moist Heat;Ultrasound;Manual techniques;Taping;Neuromuscular re-education;Dry needling    PT Next Visit Plan  LE strengthening, stretching, patellar taping, VMO strengthening, soft tissue mobilization, dry needling L IT band if needed    PT Home Exercise Plan  Access Code: [email or web address]    Consulted and Agree with Plan of Care  Patient       Patient  will benefit from skilled therapeutic intervention in order to improve the following  deficits and impairments:  Decreased strength, Decreased range of motion, Pain, Impaired flexibility  Visit Diagnosis: Acute pain of left knee  Muscle weakness (generalized)     Problem List Patient Active Problem List   Diagnosis Date Noted  . Primary osteoarthritis of first carpometacarpal joint of left hand 01/26/2019  . Abnormal MRI of head 10/03/2015  . Primary osteoarthritis of left knee 10/26/2013  . Headache 09/30/2013  . Obstructive sleep apnea 7cm water pressure CPAP 10/24/2010  . Rosacea 09/13/2010  . ALLERGIC RHINITIS CAUSE UNSPECIFIED 08/03/2008  . SPONDYLOLISTHESIS, ACQUIRED 05/21/2006  . SCOLIOSIS 12/31/2005  . SHOULDER/ARM SPRAIN/STRAIN, UNSPEC. 12/31/2005    Nicholson, Dike 03/01/2019, 10:48 PM  [facility] [place], [place], [address] Phone: [phone]   Fax:  [phone]  Name: Briana Parks MRN: [id number] Date of Birth: 24-Mar-1966

## 2019-03-01 NOTE — Patient Instructions (Signed)
Access Code: [email or web address]  URL: [email or web address]  Date: 03/01/2019  Prepared by: Rudell Cobb   Exercises Seated Hamstring Stretch - 2-3 reps - 1 sets - 30 seconds hold - 2x daily - 7x weekly Seated Figure 4 Piriformis Stretch - 3 reps - 1 sets - 30 seconds hold - 2x daily - 7x weekly Gastroc Stretch on Wall - 2-3 reps - 1 sets - 30 seconds hold - 2x daily - 7x weekly Wall Squat Hold with Ball - 10 reps - 3 sets - 1x daily - 7x weekly Supine Bridge - 10-15 reps - 2 sets - 5 seconds hold - 2x daily - 7x weekly Straight Leg Raise with External Rotation - 10 reps - 3 sets - 1x daily - 7x weekly Prone Quadriceps Stretch with Strap - 3 reps - 1 sets - 30 seconds hold - 2x daily - 7x weekly Supine ITB Stretch with Strap - 10 reps - 3 sets - 2x daily - 7x weekly Sidelying TFL Stretch - 3 reps - 1 sets - 30 seconds hold - 2x daily                            - 7x weekly

## 2019-03-04 ENCOUNTER — Other Ambulatory Visit: Payer: Self-pay

## 2019-03-04 ENCOUNTER — Ambulatory Visit (INDEPENDENT_AMBULATORY_CARE_PROVIDER_SITE_OTHER): Payer: BC Managed Care – PPO | Admitting: Physical Therapy

## 2019-03-04 DIAGNOSIS — M6281 Muscle weakness (generalized): Secondary | ICD-10-CM

## 2019-03-04 DIAGNOSIS — M25562 Pain in left knee: Secondary | ICD-10-CM

## 2019-03-04 NOTE — Therapy (Signed)
[place], [place], [address] Phone: [phone]   Fax:  [phone]  Physical Therapy Treatment  Patient Details  Name: Briana Parks MRN: [id number] Date of Birth: 1965-11-25 Referring Provider (PT): Silverio Decamp, MD   Encounter Date: 03/04/2019  PT End of Session - 03/04/19 1629    Visit Number  5    Number of Visits  12    Date for PT Re-Evaluation  03/29/19    Authorization Type  BCBS state    PT Start Time  6803    PT Stop Time  1601    PT Time Calculation (min)  46 min    Activity Tolerance  Patient tolerated treatment well    Behavior During Therapy  [facility] for tasks assessed/performed       Past Medical History:  Diagnosis Date  . ALLERGIC RHINITIS CAUSE UNSPECIFIED 08/03/2008   Qualifier: Diagnosis of  By: Esmeralda Arthur    . Headache 09/30/2013  . Obstructive sleep apnea 7cm water pressure CPAP 10/24/2010   Moderate. Sleep study was performed at [facility] neurological clinic by Dr. Pecolia Ades on 10/02/2010. AHI was 19.8 with desaturations to 79%. Frequent severe snoring. Moderate wedge works associated with respiratory events. Difficulty initiating and maintaining sleep with no slow wave sleep. She had severe OSA and REM.   . Osteoarthritis of left knee 10/26/2013  . Rosacea 09/13/2010    Past Surgical History:  Procedure Laterality Date  . bone spur removal Right 02/17/2012   foot  . bone spur removal  08/2015  . HERNIA REPAIR    . KNEE ARTHROSCOPY Left 03/16/14   [facility]   . tonsillectome and adnoidectomy    . TUBAL LIGATION      There were no vitals filed for this visit.  Subjective Assessment - 03/04/19 1519    Subjective  Pt reports she has had relief for a few hours after therapy sessions, but the pain returns (1/10 dully and achy) at bedtime and wakes her up in the night. It's hard for her to get comfortable at night.    Pertinent History   L knee partial meniscectomy, sleep apnea, spondylolisthesis, h/o grand mal seizure (abnormal MRI-- enlarged R ventricle)    Currently in Pain?  No/denies    Pain Score  0-No pain    Pain Location  Knee         [facility] PT Assessment - 03/04/19 0001      Assessment   Medical Diagnosis  M17.12 (ICD-10-CM) - Primary osteoarthritis of left knee    Referring Provider (PT)  Silverio Decamp, MD    Prior Therapy  none      AROM   Left Knee Extension  0 (supine with quad set)   Left Knee Flexion  122 (supine heel slide)        [facility] Adult PT Treatment/Exercise - 03/04/19 0001      Self-Care   Self-Care  Other Self-Care Comments    Other Self-Care Comments   reviewed sleep positions and suggested pillow between knees or under Lt knee to provide support and more neutral leg; pt verbalized understanding       Knee/Hip Exercises: Stretches   Passive Hamstring Stretch  Left;2 reps;30 seconds   seated   Quad Stretch  Left;4 reps;30 seconds;Right;1 rep;20 seconds   seated x 2, prone x 2    Piriformis Stretch  Left;1 rep;20 seconds   seated   Other Knee/Hip  Stretches  hip adductor stretch in standing (Lt) x 15 sec x 2 reps (with UE support on counter, cues to forward flex trunk)      Knee/Hip Exercises: Aerobic   Nustep  L5: 6 min (legs only)      Knee/Hip Exercises: Standing   Wall Squat  1 set;5 seconds;5 reps      Knee/Hip Exercises: Supine   Quad Sets  Strengthening;Left;1 set;10 reps    Straight Leg Raises  Left;1 set;10 reps      Manual Therapy   Soft tissue mobilization  IASTM to Lt distal adductors, Lt quad, and Lt mid-distal ITB/lateral quad - to decrease fascial restrictions.     [place]  I strip of reg Rock tape applied to medial Lt knee (with ~30 knee flexion) 20% stretch, perpendicular short strip with 30% stretch just above Lt medial joint line - to decompress tissue and increase proprioception.               PT Education - 03/04/19 1619    Education Details  HEP - added seated quad stretch as alternative; emphasized importance of consistancy of completing exercises daily.    Person(s) Educated  Patient    Methods  Explanation;Demonstration   pt declined handout.   Comprehension  Verbalized understanding;Returned demonstration          PT Long Term Goals - 03/04/19 1632      PT LONG TERM GOAL #1   Title  The patient will be indep with HEP.    Time  6    Period  Weeks    Status  On-going      PT LONG TERM GOAL #2   Title  The patient will report resolution of "stabbing" knee pain L side.    Baseline  Patient reports no further 'stabbing" episodes with L knee.    Time  6    Period  Weeks    Status  Achieved      PT LONG TERM GOAL #3   Title  The patient will be report < 18% limitation per FOTO functional status survey to demonstrate improved functional use of LE.    Time  6    Period  Weeks    Status  On-going      PT LONG TERM GOAL #4   Title  The patient will report 0/10 baseline pain in L knee with walking.    Time  6    Period  Weeks    Status  Partially Met      PT LONG TERM GOAL #5   Title  Improve L knee AROM 0-115 degrees.    Baseline  2-110    Time  6    Period  Weeks    Status  Achieved            Plan - 03/04/19 1620    Clinical Impression Statement  Pt was point tender in Lt distal adductors, mid quad, mid ITB, and pes anserine with palpation and IASTM.  Tightness noted in these areas with stretches; encouraged pt to be more diligent with stretches for LLE.  Trial of reg Rock tape applied to medial Lt knee to decompress tissue and increase proprioception.  Pt's Lt knee ROM has improved; has met LTG#5 and is making good gains towards remaining goals.    PT Treatment/Interventions  ADLs/Self Care Home Management;Functional mobility training;Gait training;Stair training;Therapeutic activities;Therapeutic exercise;Balance  training;Cryotherapy;Iontophoresis  $'4mg'k$ /ml Dexamethasone;Moist Heat;Ultrasound;Manual techniques;Taping;Neuromuscular re-education;Dry needling    PT Next Visit Plan  assess response to tape and compliance to HEP. STM, LLE strengthening/ stretching.    PT Home Exercise Plan  Access Code: [email or web address]    Consulted and Agree with Plan of Care  Patient       Patient will benefit from skilled therapeutic intervention in order to improve the following deficits and impairments:  Decreased strength, Decreased range of motion, Pain, Impaired flexibility  Visit Diagnosis: Acute pain of left knee  Muscle weakness (generalized)     Problem List Patient Active Problem List   Diagnosis Date Noted  . Primary osteoarthritis of first carpometacarpal joint of left hand 01/26/2019  . Abnormal MRI of head 10/03/2015  . Primary osteoarthritis of left knee 10/26/2013  . Headache 09/30/2013  . Obstructive sleep apnea 7cm water pressure CPAP 10/24/2010  . Rosacea 09/13/2010  . ALLERGIC RHINITIS CAUSE UNSPECIFIED 08/03/2008  . SPONDYLOLISTHESIS, ACQUIRED 05/21/2006  . SCOLIOSIS 12/31/2005  . SHOULDER/ARM SPRAIN/STRAIN, UNSPEC. 12/31/2005   Kerin Perna, PTA 03/04/19 4:33 PM  [facility] [place], [place], [address] Phone: [phone]   Fax:  [phone]  Name: Briana Parks MRN: [id number] Date of Birth: 01/28/1966

## 2019-03-08 ENCOUNTER — Encounter: Payer: BC Managed Care – PPO | Admitting: Physical Therapy

## 2019-03-10 ENCOUNTER — Telehealth: Payer: Self-pay | Admitting: Sports Medicine

## 2019-03-10 NOTE — Telephone Encounter (Signed)
Received a fax from insurance that [employer] is approved from 02/26/2019 through 02/26/2020. I have left a message for the patient to call back.

## 2019-03-10 NOTE — Telephone Encounter (Signed)
-----   Message from Tasia Catchings, [place] sent at 02/26/2019  2:53 PM EST ----- Received a fax from patient insurance requesting additional information for the knee injections. Will fill out form and have it faxed back.  ----- Message ----- From: Tasia Catchings, CMA Sent: 02/24/2019  10:56 AM EST To: Tasia Catchings, CMA  Information has been submitted to [facility] and awaiting determination.   ----- Message ----- From: Silverio Decamp, MD Sent: 02/23/2019   3:37 PM EST To: Tasia Catchings, CMA  [facility]th[facility] approval please, left knee, failed everything ___________________________________________Thomas J. Dianah Field, M.D., ABFM., CAQSM.Primary Care and Sports MedicineCone Health MedCenter KernersvilleAdjunct Professor of [place] [employer]

## 2019-03-11 ENCOUNTER — Encounter: Payer: BC Managed Care – PPO | Admitting: Rehabilitative and Restorative Service Providers"

## 2019-03-17 NOTE — Telephone Encounter (Signed)
Mychart message sent as well to patient.

## 2019-03-17 NOTE — Telephone Encounter (Signed)
Left brief message for patient to call back about knee injections.

## 2019-03-22 NOTE — Telephone Encounter (Signed)
Patient is agreeable to the estimated cost of the injection and will make the first appointment to get her first Orth[facility]isc injections.

## 2019-03-23 ENCOUNTER — Ambulatory Visit: Payer: BC Managed Care – PPO | Admitting: Sports Medicine

## 2019-03-23 ENCOUNTER — Other Ambulatory Visit: Payer: Self-pay

## 2019-03-23 DIAGNOSIS — M1712 Unilateral primary osteoarthritis, left knee: Secondary | ICD-10-CM | POA: Diagnosis not present

## 2019-03-23 NOTE — Progress Notes (Signed)
   Procedure: Real-time Ultrasound Guidedinjection of theleft knee Device: Samsung HS60  Verbal informed consent obtained.  Time-out conducted.  Noted no overlying erythema, induration, or other signs of local infection.  Skin prepped in a sterile fashion.  Local anesthesia: Topical Ethyl chloride.  With sterile technique and under real time ultrasound guidance:30 mg/2 mL of OrthoVisc (sodium hyaluronate) in a prefilled syringe was injected easily into the knee through a 22-gauge needle. Completed without difficulty  Pain immediately resolved suggesting accurate placement of the medication.  Advised to call if fevers/chills, erythema, induration, drainage, or persistent bleeding.  Images permanently stored and available for review in the ultrasound unit.  Impression: Technically successful ultrasound guided injection. 

## 2019-03-23 NOTE — Assessment & Plan Note (Signed)
Orthov[facility] injection #1 of 4 into the left knee, return in 1 week for #2 of 4 

## 2019-03-30 ENCOUNTER — Other Ambulatory Visit: Payer: Self-pay

## 2019-03-30 ENCOUNTER — Ambulatory Visit: Payer: BC Managed Care – PPO | Admitting: Sports Medicine

## 2019-03-30 DIAGNOSIS — M1712 Unilateral primary osteoarthritis, left knee: Secondary | ICD-10-CM

## 2019-03-30 NOTE — Assessment & Plan Note (Signed)
Orthovisc No. 2 of 4 to the left knee, return in 1 week for #3 of 4.

## 2019-03-30 NOTE — Progress Notes (Signed)
   Procedure: Real-time Ultrasound Guidedinjection of theleft knee Device: Samsung HS60  Verbal informed consent obtained.  Time-out conducted.  Noted no overlying erythema, induration, or other signs of local infection.  Skin prepped in a sterile fashion.  Local anesthesia: Topical Ethyl chloride.  With sterile technique and under real time ultrasound guidance:30 mg/2 mL of OrthoVisc (sodium hyaluronate) in a prefilled syringe was injected easily into the knee through a 22-gauge needle. Completed without difficulty  Pain immediately resolved suggesting accurate placement of the medication.  Advised to call if fevers/chills, erythema, induration, drainage, or persistent bleeding.  Images permanently stored and available for review in the ultrasound unit.  Impression: Technically successful ultrasound guided injection. 

## 2019-04-06 ENCOUNTER — Other Ambulatory Visit: Payer: Self-pay

## 2019-04-06 ENCOUNTER — Ambulatory Visit: Payer: BC Managed Care – PPO | Admitting: Sports Medicine

## 2019-04-06 DIAGNOSIS — M1712 Unilateral primary osteoarthritis, left knee: Secondary | ICD-10-CM

## 2019-04-06 NOTE — Assessment & Plan Note (Signed)
Orthovisc No. 3 of 4 into the left knee, return in 1 week for #4 of 4. 

## 2019-04-06 NOTE — Progress Notes (Signed)
   Procedure: Real-time Ultrasound Guidedinjection of theleft knee Device: Samsung HS60  Verbal informed consent obtained.  Time-out conducted.  Noted no overlying erythema, induration, or other signs of local infection.  Skin prepped in a sterile fashion.  Local anesthesia: Topical Ethyl chloride.  With sterile technique and under real time ultrasound guidance:30 mg/2 mL of OrthoVisc (sodium hyaluronate) in a prefilled syringe was injected easily into the knee through a 22-gauge needle. Completed without difficulty  Pain immediately resolved suggesting accurate placement of the medication.  Advised to call if fevers/chills, erythema, induration, drainage, or persistent bleeding.  Images permanently stored and available for review in the ultrasound unit.  Impression: Technically successful ultrasound guided injection. 

## 2019-04-13 ENCOUNTER — Other Ambulatory Visit: Payer: Self-pay

## 2019-04-13 ENCOUNTER — Ambulatory Visit: Payer: BC Managed Care – PPO | Admitting: Sports Medicine

## 2019-04-13 DIAGNOSIS — M1712 Unilateral primary osteoarthritis, left knee: Secondary | ICD-10-CM

## 2019-04-13 NOTE — Progress Notes (Addendum)
   Procedure: Real-time Ultrasound Guidedinjection of theleft knee Device: Samsung HS60  Verbal informed consent obtained.  Time-out conducted.  Noted no overlying erythema, induration, or other signs of local infection.  Skin prepped in a sterile fashion.  Local anesthesia: Topical Ethyl chloride.  With sterile technique and under real time ultrasound guidance:Using an 18-gauge needle aspirated 10 mL of clear, straw-colored fluid, syringe switched and 30 mg/2 mL of OrthoVisc (sodium hyaluronate) in a prefilled syringe was injected easily into the knee.  Completed without difficulty  Pain immediately resolved suggesting accurate placement of the medication.  Advised to call if fevers/chills, erythema, induration, drainage, or persistent bleeding.  Images permanently stored and available for review in the ultrasound unit.  Impression: Technically successful ultrasound guided injection.

## 2019-04-13 NOTE — Assessment & Plan Note (Signed)
Orthovisc No. 4 of 4 into the left knee, return in 1 month.

## 2019-04-21 ENCOUNTER — Encounter: Payer: Self-pay | Admitting: Rehabilitative and Restorative Service Providers"

## 2019-04-21 NOTE — Therapy (Signed)
[facility] [place], [place], [address] Phone: [phone]   Fax:  [phone]  Patient Details  Name: Briana Parks MRN: [id number] Date of Birth: [birth year] Referring Provider:  No ref. provider found  Encounter Date: last encounter 03/04/2019  PHYSICAL THERAPY DISCHARGE SUMMARY  Visits from Start of Care: 5  Current functional level related to goals / functional outcomes:   PT Long Term Goals - 03/04/19 1632      PT LONG TERM GOAL #1   Title  The patient will be indep with HEP.    Time  6    Period  Weeks    Status  On-going      PT LONG TERM GOAL #2   Title  The patient will report resolution of "stabbing" knee pain L side.    Baseline  Patient reports no further 'stabbing" episodes with L knee.    Time  6    Period  Weeks    Status  Achieved      PT LONG TERM GOAL #3   Title  The patient will be report < 18% limitation per FOTO functional status survey to demonstrate improved functional use of LE.    Time  6    Period  Weeks    Status  On-going      PT LONG TERM GOAL #4   Title  The patient will report 0/10 baseline pain in L knee with walking.    Time  6    Period  Weeks    Status  Partially Met      PT LONG TERM GOAL #5   Title  Improve L knee AROM 0-115 degrees.    Baseline  2-110    Time  6    Period  Weeks    Status  Achieved         Remaining deficits: Improving overall mobility, continues with intermittent knee pain;  Patient did not return for remaining goals to be assessed.   Education / Equipment: Home program, increasing activity to tolerance.  Plan: Patient agrees to discharge.  Patient goals were partially met. Patient is being discharged due to meeting the stated rehab goals.  ?????          Thank you for the referral of this patient. Rudell Cobb, MPT  Jakel Alphin 04/21/2019, 8:52 PM  [facility] [place], [place], [address] Phone: [phone]   Fax:  [phone]

## 2019-05-11 ENCOUNTER — Ambulatory Visit: Payer: BC Managed Care – PPO | Admitting: Sports Medicine

## 2019-05-11 DIAGNOSIS — M1712 Unilateral primary osteoarthritis, left knee: Secondary | ICD-10-CM | POA: Diagnosis not present

## 2019-05-11 MED ORDER — ACETAMINOPHEN ER 650 MG PO TBCR: 650.0000 mg | EXTENDED_RELEASE_TABLET | Freq: Three times a day (TID) | ORAL | 3 refills | Status: DC | PRN

## 2019-05-11 NOTE — Progress Notes (Signed)
    Procedures performed today:    None.  Independent interpretation of tests performed by another provider:   None.  Impression and Recommendations:    Primary osteoarthritis of left knee Briana Parks returns, she is a pleasant 54 year old female, at this point we have exhausted all options for her knee, she has had steroid injections, NSAIDs, therapy, viscosupplementation, knee arthroscopy, nothing is work. Referral back to Dr. Luiz Blare for total knee arthroplasty. She will do NSAIDs and arthritis from Tylenol in the meantime.    ___________________________________________ Ihor Austin. Benjamin Stain, M.D., ABFM., CAQSM. Primary Care and Sports Medicine [facility]  Adjunct Instructor of Family Medicine  [employer]

## 2019-05-11 NOTE — Assessment & Plan Note (Signed)
Naryiah returns, she is a pleasant 54 year old female, at this point we have exhausted all options for her knee, she has had steroid injections, NSAIDs, therapy, viscosupplementation, knee arthroscopy, nothing is work. Referral back to Dr. Luiz Blare for total knee arthroplasty. She will do NSAIDs and arthritis from Tylenol in the meantime.

## 2019-05-18 ENCOUNTER — Other Ambulatory Visit: Payer: Self-pay | Admitting: Sports Medicine

## 2019-05-18 DIAGNOSIS — M1712 Unilateral primary osteoarthritis, left knee: Secondary | ICD-10-CM

## 2019-05-25 ENCOUNTER — Other Ambulatory Visit: Payer: Self-pay | Admitting: Orthopedic Surgery

## 2019-06-03 ENCOUNTER — Encounter (HOSPITAL_COMMUNITY): Payer: Self-pay

## 2019-06-03 ENCOUNTER — Encounter: Payer: Self-pay | Admitting: Family Medicine

## 2019-06-03 ENCOUNTER — Other Ambulatory Visit: Payer: Self-pay

## 2019-06-03 ENCOUNTER — Ambulatory Visit: Payer: BC Managed Care – PPO | Admitting: Family Medicine

## 2019-06-03 VITALS — BP 129/56 | HR 75 | Ht 66.0 in | Wt 218.0 lb

## 2019-06-03 DIAGNOSIS — G4733 Obstructive sleep apnea (adult) (pediatric): Secondary | ICD-10-CM

## 2019-06-03 DIAGNOSIS — Z01818 Encounter for other preprocedural examination: Secondary | ICD-10-CM | POA: Diagnosis not present

## 2019-06-03 NOTE — Patient Instructions (Addendum)
DUE TO COVID-19 ONLY ONE VISITOR IS ALLOWED TO COME WITH YOU AND STAY IN THE WAITING ROOM ONLY DURING PRE OP AND PROCEDURE. THE ONE VISITOR MAY VISIT WITH YOU IN YOUR PRIVATE ROOM DURING VISITING HOURS ONLY!!   COVID SWAB TESTING MUST BE COMPLETED ON: Tuesday, June 08, 2019 at  10:45 AM  [address], [place] [facility] enter pre surgical testing line (Must self quarantine after testing. Follow instructions on handout.)         Your procedure is scheduled on: Friday, June 11, 2019   Report to [facility] Main  Entrance    Report to admitting at 7:15 AM   Call this number if you have problems the morning of surgery [phone]   Bring CPAP mask and tubing day of surgery   Do not eat food:After Midnight.   May have liquids until 6:50 AM day of surgery   CLEAR LIQUID DIET  Foods Allowed                                                                     Foods Excluded  Water, Black Coffee and tea, regular and decaf                             liquids that you cannot  Plain Jell-O in any flavor  (No red)                                           see through such as: Fruit ices (not with fruit pulp)                                     milk, soups, orange juice  Iced Popsicles (No red)                                    All solid food Carbonated beverages, regular and diet                                    Apple juices Sports drinks like Gatorade (No red) Lightly seasoned clear broth or consume(fat free) Sugar, honey syrup  Sample Menu Breakfast                                Lunch                                     Supper Cranberry juice                    Beef broth  Chicken broth Jell-O                                     Grape juice                           Apple juice Coffee or tea                        Jell-O                                      Popsicle                                                Coffee  or tea                        Coffee or tea   Complete one Ensure drink the morning of surgery at 6:50 AM the day of surgery.   Oral Hygiene is also important to reduce your risk of infection.                                    Remember - BRUSH YOUR TEETH THE MORNING OF SURGERY WITH YOUR REGULAR TOOTHPASTE   Do NOT smoke after Midnight   Take these medicines the morning of surgery with A SIP OF WATER: Propranolol                               You may not have any metal on your body including hair pins, jewelry, and body piercings             Do not wear make-up, lotions, powders, perfumes/cologne, or deodorant             Do not wear nail polish.  Do not shave  48 hours prior to surgery.              Do not bring valuables to the hospital. [facility] IS NOT             RESPONSIBLE   FOR VALUABLES.   Contacts, dentures or bridgework may not be worn into surgery.   Bring small overnight bag day of surgery.    Patients discharged the day of surgery will not be allowed to drive home.   Special Instructions: Bring a copy of your healthcare power of attorney and living will documents         the day of surgery if you haven't scanned them in before.              Please read over the following fact sheets you were given:  [facility] - Preparing for Surgery Before surgery, you can play an important role.  Because skin is not sterile, your skin needs to be as free of germs as possible.  You can reduce the number of germs on your skin by washing with CHG (chlorahexidine gluconate) soap before surgery.  CHG is an antiseptic cleaner  which kills germs and bonds with the skin to continue killing germs even after washing. Please DO NOT use if you have an allergy to CHG or antibacterial soaps.  If your skin becomes reddened/irritated stop using the CHG and inform your nurse when you arrive at Short Stay. Do not shave (including legs and underarms) for at least 48 hours prior to the first CHG shower.   You may shave your face/neck.  Please follow these instructions carefully:  1.  Shower with CHG Soap the night before surgery and the  morning of surgery.  2.  If you choose to wash your hair, wash your hair first as usual with your normal  shampoo.  3.  After you shampoo, rinse your hair and body thoroughly to remove the shampoo.                             4.  Use CHG as you would any other liquid soap.  You can apply chg directly to the skin and wash.  Gently with a scrungie or clean washcloth.  5.  Apply the CHG Soap to your body ONLY FROM THE NECK DOWN.   Do   not use on face/ open                           Wound or open sores. Avoid contact with eyes, ears mouth and   genitals (private parts).                       Wash face,  Genitals (private parts) with your normal soap.             6.  Wash thoroughly, paying special attention to the area where your    surgery  will be performed.  7.  Thoroughly rinse your body with warm water from the neck down.  8.  DO NOT shower/wash with your normal soap after using and rinsing off the CHG Soap.                9.  Pat yourself dry with a clean towel.            10.  Wear clean pajamas.            11.  Place clean sheets on your bed the night of your first shower and do not  sleep with pets. Day of Surgery : Do not apply any lotions/deodorants the morning of surgery.  Please wear clean clothes to the hospital/surgery center.  FAILURE TO FOLLOW THESE INSTRUCTIONS MAY RESULT IN THE CANCELLATION OF YOUR SURGERY  PATIENT SIGNATURE_________________________________  NURSE SIGNATURE__________________________________  ________________________________________________________________________   Rogelia MireIncentive Spirometer  An incentive spirometer is a tool that can help keep your lungs clear and active. This tool measures how well you are filling your lungs with each breath. Taking long deep breaths may help reverse or decrease the chance of developing  breathing (pulmonary) problems (especially infection) following:  A long period of time when you are unable to move or be active. BEFORE THE PROCEDURE   If the spirometer includes an indicator to show your best effort, your nurse or respiratory therapist will set it to a desired goal.  If possible, sit up straight or lean slightly forward. Try not to slouch.  Hold the incentive spirometer in an upright position. INSTRUCTIONS FOR USE  1. Sit on the edge of  your bed if possible, or sit up as far as you can in bed or on a chair. 2. Hold the incentive spirometer in an upright position. 3. Breathe out normally. 4. Place the mouthpiece in your mouth and seal your lips tightly around it. 5. Breathe in slowly and as deeply as possible, raising the piston or the ball toward the top of the column. 6. Hold your breath for 3-5 seconds or for as long as possible. Allow the piston or ball to fall to the bottom of the column. 7. Remove the mouthpiece from your mouth and breathe out normally. 8. Rest for a few seconds and repeat Steps 1 through 7 at least 10 times every 1-2 hours when you are awake. Take your time and take a few normal breaths between deep breaths. 9. The spirometer may include an indicator to show your best effort. Use the indicator as a goal to work toward during each repetition. 10. After each set of 10 deep breaths, practice coughing to be sure your lungs are clear. If you have an incision (the cut made at the time of surgery), support your incision when coughing by placing a pillow or rolled up towels firmly against it. Once you are able to get out of bed, walk around indoors and cough well. You may stop using the incentive spirometer when instructed by your caregiver.  RISKS AND COMPLICATIONS  Take your time so you do not get dizzy or light-headed.  If you are in pain, you may need to take or ask for pain medication before doing incentive spirometry. It is harder to take a deep  breath if you are having pain. AFTER USE  Rest and breathe slowly and easily.  It can be helpful to keep track of a log of your progress. Your caregiver can provide you with a simple table to help with this. If you are using the spirometer at home, follow these instructions: SEEK MEDICAL CARE IF:   You are having difficultly using the spirometer.  You have trouble using the spirometer as often as instructed.  Your pain medication is not giving enough relief while using the spirometer.  You develop fever of 100.5 F (38.1 C) or higher. SEEK IMMEDIATE MEDICAL CARE IF:   You cough up bloody sputum that had not been present before.  You develop fever of 102 F (38.9 C) or greater.  You develop worsening pain at or near the incision site. MAKE SURE YOU:   Understand these instructions.  Will watch your condition.  Will get help right away if you are not doing well or get worse. Document Released: 07/22/2006 Document Revised: 06/03/2011 Document Reviewed: 09/22/2006 ExitCare Patient Information 2014 ExitCare, [place].   ________________________________________________________________________  WHAT IS A BLOOD TRANSFUSION? Blood Transfusion Information  A transfusion is the replacement of blood or some of its parts. Blood is made up of multiple cells which provide different functions.  Red blood cells carry oxygen and are used for blood loss replacement.  White blood cells fight against infection.  Platelets control bleeding.  Plasma helps clot blood.  Other blood products are available for specialized needs, such as hemophilia or other clotting disorders. BEFORE THE TRANSFUSION  Who gives blood for transfusions?   Healthy volunteers who are fully evaluated to make sure their blood is safe. This is blood bank blood. Transfusion therapy is the safest it has ever been in the practice of medicine. Before blood is taken from a donor, a complete history is taken to  make sure  that person has no history of diseases nor engages in risky social behavior (examples are intravenous drug use or sexual activity with multiple partners). The donor's travel history is screened to minimize risk of transmitting infections, such as malaria. The donated blood is tested for signs of infectious diseases, such as HIV and hepatitis. The blood is then tested to be sure it is compatible with you in order to minimize the chance of a transfusion reaction. If you or a relative donates blood, this is often done in anticipation of surgery and is not appropriate for emergency situations. It takes many days to process the donated blood. RISKS AND COMPLICATIONS Although transfusion therapy is very safe and saves many lives, the main dangers of transfusion include:   Getting an infectious disease.  Developing a transfusion reaction. This is an allergic reaction to something in the blood you were given. Every precaution is taken to prevent this. The decision to have a blood transfusion has been considered carefully by your caregiver before blood is given. Blood is not given unless the benefits outweigh the risks. AFTER THE TRANSFUSION  Right after receiving a blood transfusion, you will usually feel much better and more energetic. This is especially true if your red blood cells have gotten low (anemic). The transfusion raises the level of the red blood cells which carry oxygen, and this usually causes an energy increase.  The nurse administering the transfusion will monitor you carefully for complications. HOME CARE INSTRUCTIONS  No special instructions are needed after a transfusion. You may find your energy is better. Speak with your caregiver about any limitations on activity for underlying diseases you may have. SEEK MEDICAL CARE IF:   Your condition is not improving after your transfusion.  You develop redness or irritation at the intravenous (IV) site. SEEK IMMEDIATE MEDICAL CARE IF:  Any of  the following symptoms occur over the next 12 hours:  Shaking chills.  You have a temperature by mouth above 102 F (38.9 C), not controlled by medicine.  Chest, back, or muscle pain.  People around you feel you are not acting correctly or are confused.  Shortness of breath or difficulty breathing.  Dizziness and fainting.  You get a rash or develop hives.  You have a decrease in urine output.  Your urine turns a dark color or changes to pink, red, or brown. Any of the following symptoms occur over the next 10 days:  You have a temperature by mouth above 102 F (38.9 C), not controlled by medicine.  Shortness of breath.  Weakness after normal activity.  The white part of the eye turns yellow (jaundice).  You have a decrease in the amount of urine or are urinating less often.  Your urine turns a dark color or changes to pink, red, or brown. Document Released: 03/08/2000 Document Revised: 06/03/2011 Document Reviewed: 10/26/2007 [facility] Patient Information 2014 [place], [place].  _______________________________________________________________________

## 2019-06-03 NOTE — Progress Notes (Addendum)
Established Patient Office Visit  Subjective:  Patient ID: KAELYN INNOCENT, female    DOB: Apr 23, 1965  Age: 54 y.o. MRN: [id number]  CC:  Chief Complaint  Patient presents with  . surgical clearance    L knee 3/19 Dr. Luiz Blare    HPI Annamarie Major presents for :  Preoperative clearance for left complete knee replacement.  She has failed conservative management for left knee osteoarthritis.  She is under the surgical care of Dr. Luiz Blare and is scheduled for March 19.  She is currently taking meloxicam.  No prior history of hypertension, diabetes, pulmonary or cardiac disease.  She does have a history of obstructive sleep apnea but has not been using her CPAP for about 3 or 4 years.  She has not experienced any recent chest pain shortness of breath or palpitations.  She is able to walk several blocks and up stairs without difficulty, except for some pain in her knee.  No prior history of anemia.  In the past she has had some difficulty to arouse after sedation and has had vomiting after anesthesia.  Though, she reports after she had her foot surgery in 17 she did not have vomiting afterwards and thinks that they use something else for sedation.  I copied the medication list from the anesthesia notes as below, in case this is helpful for the anesthetist:  Meds Name Total  Fentanyl 0.05 mg/mL 25 mcg  Midazolam (VERSED) 1 mg/mL 1 mg  Ondansetron (ZOFRAN) 4 mg 4 mg  Glycopyrrolate 0.2 mg/mL 0.1 mg  Lidocaine (XYLOCAINE) (cardiac) 2 % 40 mg  propofol infusion 10 mg/mL 34ml (for OR) 45.26 mg  Propofol Bolus 10 mg/ml 40 mg     Past Medical History:  Diagnosis Date  . ALLERGIC RHINITIS CAUSE UNSPECIFIED 08/03/2008   Qualifier: Diagnosis of  By: Thomos Lemons    . Headache 09/30/2013  . Obstructive sleep apnea 7cm water pressure CPAP 10/24/2010   Moderate. Sleep study was performed at [facility] by Dr. Marliss Coots on 10/02/2010. AHI was 19.8 with desaturations to 79%.  Frequent severe snoring. Moderate wedge works associated with respiratory events. Difficulty initiating and maintaining sleep with no slow wave sleep. She had severe OSA and REM.   . Osteoarthritis of left knee 10/26/2013  . Plantar fasciitis   . Rosacea 09/13/2010  . Scoliosis   . Tinnitus   . Vertigo     Past Surgical History:  Procedure Laterality Date  . bone spur removal Right 02/17/2012   foot  . bone spur removal  08/2015  . HERNIA REPAIR    . KNEE ARTHROSCOPY Left 03/16/14   [facility]   . tonsillectome and adnoidectomy    . TUBAL LIGATION      Family History  Problem Relation Age of Onset  . Arthritis Mother   . Fibromyalgia Mother   . Cancer Mother        liver small intestine carcinoid tumor    Social History   Socioeconomic History  . Marital status: Married    Spouse name: Fayrene Fearing  . Number of children: 2  . Years of education: college  . Highest education level: Not on file  Occupational History    Employer: [employer]    Comment: [employer]  Tobacco Use  . Smoking status: Never Smoker  . Smokeless tobacco: Never Used  Substance and Sexual Activity  . Alcohol use: Yes    Alcohol/week: 1.0 standard drinks    Types:  1 Glasses of wine per week    Comment: rare  . Drug use: No  . Sexual activity: Not on file    Comment: cake decorator, 2 yrs college, married, 2 children, 4 sodas daily, no regular exercise  Other Topics Concern  . Not on file  Social History Narrative   Patient lives at home with her husband Fayrene Fearing)   Education two years of college education.   Patient works for [employer]. [employer]   Right handed.   Caffeine three cups of coke daily.         Social Determinants of Health   Financial Resource Strain:   . Difficulty of Paying Living Expenses:   Food Insecurity:   . Worried About Programme researcher, broadcasting/film/video in the Last Year:   . Barista in the Last Year:   Transportation Needs:   . Automotive engineer (Medical):   Marland Kitchen Lack of Transportation (Non-Medical):   Physical Activity:   . Days of Exercise per Week:   . Minutes of Exercise per Session:   Stress:   . Feeling of Stress :   Social Connections:   . Frequency of Communication with Friends and Family:   . Frequency of Social Gatherings with Friends and Family:   . Attends Religious Services:   . Active Member of Clubs or Organizations:   . Attends Banker Meetings:   Marland Kitchen Marital Status:   Intimate Partner Violence:   . Fear of Current or Ex-Partner:   . Emotionally Abused:   Marland Kitchen Physically Abused:   . Sexually Abused:     Outpatient Medications Prior to Visit  Medication Sig Dispense Refill  . acetaminophen (TYLENOL) 650 MG CR tablet Take 1 tablet (650 mg total) by mouth every 8 (eight) hours as needed for pain. 90 tablet 3  . AMBULATORY NON FORMULARY MEDICATION Medication Name: 7 cm water pressure CPAP, heated/humidified machine.   Dx : Severe OSA. 1 Units 0  . diphenhydrAMINE-Acetaminophen (NIGHTTIME PAIN PLUS SLEEP PO) Take 15 mLs by mouth at bedtime as needed (pain/sleep.). ZzzQuil Night Pain    . ibuprofen (ADVIL) 200 MG tablet Take 800 mg by mouth every 8 (eight) hours as needed (pain.).    Marland Kitchen loratadine (CLARITIN) 10 MG tablet Take 10 mg by mouth at bedtime.    . meloxicam (MOBIC) 15 MG tablet TAKE 1 TABLET BY MOUTH IN THE MORNING WITH BREAKFAST FOR 2 WEEKS, THEN DAILY AS NEEDED. (Patient taking differently: Take 15 mg by mouth daily as needed (pain.). ) 30 tablet 3  . Multiple Vitamin (MULTIVITAMIN WITH MINERALS) TABS tablet Take 1 tablet by mouth daily.    Marland Kitchen NORTREL 7/7/7 0.5/0.75/1-35 MG-MCG tablet TAKE 1 TABLET EVERY DAY 84 tablet 3  . nystatin (MYCOSTATIN/NYSTOP) powder Apply topically 4 (four) times daily. (Patient taking differently: Apply 1 application topically 4 (four) times daily as needed (skin irritation/rash). ) 60 g 1  . nystatin cream (MYCOSTATIN) Apply 1 application topically 2 (two)  times daily. (Patient taking differently: Apply 1 application topically 2 (two) times daily as needed (skin irritation/rash). ) 45 g 1  . propranolol (INDERAL) 20 MG tablet Take 1 tablet (20 mg total) by mouth 2 (two) times daily. 180 tablet 3   No facility-administered medications prior to visit.    Allergies  Allergen Reactions  . Eggs Or Egg-Derived Products   . Sulfamethoxazole-Trimethoprim Hives  . Tdap [Tetanus-Diphth-Acell Pertussis] Other (See Comments)    Skin reaction with lymph node sweeling  ROS Review of Systems    Objective:    Physical Exam  Constitutional: She is oriented to person, place, and time. She appears well-developed and well-nourished.  HENT:  Head: Normocephalic and atraumatic.  Right Ear: External ear normal.  Left Ear: External ear normal.  Nose: Nose normal.  Mouth/Throat: Oropharynx is clear and moist.  TMs and canals are clear.   Eyes: Pupils are equal, round, and reactive to light. Conjunctivae and EOM are normal.  Neck: No thyromegaly present.  Cardiovascular: Normal rate, regular rhythm and normal heart sounds.  Pulmonary/Chest: Effort normal and breath sounds normal. She has no wheezes.  Abdominal: Soft. Bowel sounds are normal. She exhibits no distension and no mass. There is no abdominal tenderness. There is no rebound and no guarding.  Musculoskeletal:        General: No deformity or edema.     Cervical back: Neck supple.  Lymphadenopathy:    She has no cervical adenopathy.  Neurological: She is alert and oriented to person, place, and time. She has normal reflexes. Coordination normal.  Skin: Skin is warm and dry. No pallor.  Psychiatric: She has a normal mood and affect. Her behavior is normal.  Vitals reviewed.   BP (!) 129/56   Pulse 75   Ht 5\' 6"  (1.676 m)   Wt 218 lb (98.9 kg)   SpO2 99%   BMI 35.19 kg/m  Wt Readings from Last 3 Encounters:  06/03/19 218 lb (98.9 kg)  02/23/19 215 lb (97.5 kg)  01/26/19 212 lb (96.2  kg)     There are no preventive care reminders to display for this patient.  There are no preventive care reminders to display for this patient.  Lab Results  Component Value Date   TSH 1.824 09/24/2011   Lab Results  Component Value Date   WBC 8.9 11/03/2017   HGB 14.4 11/03/2017   HCT 42.6 11/03/2017   MCV 88.9 11/03/2017   PLT 241 11/03/2017   Lab Results  Component Value Date   NA 139 11/03/2017   K 4.3 11/03/2017   CO2 25 11/03/2017   GLUCOSE 96 11/03/2017   BUN 13 11/03/2017   CREATININE 0.60 11/03/2017   BILITOT 0.5 11/03/2017   ALKPHOS 62 09/16/2014   AST 20 11/03/2017   ALT 27 11/03/2017   PROT 6.9 11/03/2017   ALBUMIN 3.9 09/16/2014   CALCIUM 9.3 11/03/2017   Lab Results  Component Value Date   CHOL 202 (H) 11/03/2017   Lab Results  Component Value Date   HDL 44 (L) 11/03/2017   Lab Results  Component Value Date   LDLCALC 126 (H) 11/03/2017   Lab Results  Component Value Date   TRIG 198 (H) 11/03/2017   Lab Results  Component Value Date   CHOLHDL 4.6 11/03/2017   No results found for: HGBA1C    Assessment & Plan:   Problem List Items Addressed This Visit    None    Visit Diagnoses    Preop general physical exam    -  Primary   OSA (obstructive sleep apnea)         She is cleared for Surgery.  Avoid all NSAIDs including stopping her meloxicam, ibuprofen, Aleve etc.  She is not currently taking any supplements or fish oil except for a multivitamin.  Okay to continue the multivitamin.  Obstructive sleep apnea - not currently on treatment.  Discussed with her that the surgeon and anesthesiologist need to be aware as she may need  to be on CPAP immediately after surgery while she is in the recovery room.  Will address further at next office visit for why she is using it.  She will get preoperative blood work next week.  EKG today shows rate of 71 bpm, normal sinus rhythm with no acute ST-T wave changes.  No orders of the defined types  were placed in this encounter.   Follow-up: Return if symptoms worsen or fail to improve.   Time spent 30 minutes in encounter including reviewing notes and EKG.  Nani Gasser, MD

## 2019-06-03 NOTE — Patient Instructions (Signed)
Stop your meloxicam today.  Any anti-inflammatories except for Tylenol up until surgery.

## 2019-06-07 ENCOUNTER — Encounter (HOSPITAL_COMMUNITY)
Admission: RE | Admit: 2019-06-07 | Discharge: 2019-06-07 | Disposition: A | Discharge status: Home / Self Care | Payer: BC Managed Care – PPO | Source: Ambulatory Visit | Attending: Orthopedic Surgery | Admitting: Orthopedic Surgery

## 2019-06-07 ENCOUNTER — Other Ambulatory Visit: Payer: Self-pay

## 2019-06-07 ENCOUNTER — Encounter (HOSPITAL_COMMUNITY): Payer: Self-pay

## 2019-06-07 HISTORY — DX: Urinary tract infection, site not specified: N39.0

## 2019-06-07 HISTORY — DX: Cerebral cysts: G93.0

## 2019-06-07 HISTORY — DX: Unilateral inguinal hernia, without obstruction or gangrene, not specified as recurrent: K40.90

## 2019-06-07 HISTORY — DX: Renal tubulo-interstitial disease, unspecified: N15.9

## 2019-06-07 HISTORY — DX: Dizziness and giddiness: R42

## 2019-06-07 HISTORY — DX: Pneumonia, unspecified organism: J18.9

## 2019-06-07 HISTORY — DX: Gestational diabetes mellitus in pregnancy, unspecified control: O24.419

## 2019-06-07 HISTORY — DX: Hypotension, unspecified: I95.9

## 2019-06-07 HISTORY — DX: Other specified postprocedural states: Z98.890

## 2019-06-07 HISTORY — DX: Plantar fascial fibromatosis: M72.2

## 2019-06-07 HISTORY — DX: Deviated nasal septum: J34.2

## 2019-06-07 HISTORY — DX: Scoliosis, unspecified: M41.9

## 2019-06-07 HISTORY — DX: Tinnitus, unspecified ear: H93.19

## 2019-06-07 HISTORY — DX: Other specified postprocedural states: R11.2

## 2019-06-07 NOTE — Progress Notes (Signed)
PCP - Dr. Cipriano Bunker last office visit note and surgical clearance 06/03/19 in epic Cardiologist - N/A  Chest x-ray - 06/08/19 in epic EKG - Requesting from Dr. Luiz Blare and Dr. Shelah Lewandowsky office Stress Test - N/A ECHO - N/A Cardiac Cath - N/A  Sleep Study - 10/2010 in epic CPAP - does not use CPAP at this time  Fasting Blood Sugar - N/A Checks Blood Sugar __N/A___ times a day  Blood Thinner Instructions: N/A Aspirin Instructions: N/A Last Dose:N/A  Anesthesia review: N/A  Patient denies shortness of breath, fever, cough and chest pain at PAT appointment   Patient verbalized understanding of instructions that were given to them at the PAT appointment. Patient was also instructed that they will need to review over the PAT instructions again at home before surgery.

## 2019-06-08 ENCOUNTER — Other Ambulatory Visit (HOSPITAL_COMMUNITY)
Admission: RE | Admit: 2019-06-08 | Discharge: 2019-06-08 | Disposition: A | Discharge status: Home / Self Care | Payer: BC Managed Care – PPO | Source: Ambulatory Visit | Attending: Orthopedic Surgery | Admitting: Orthopedic Surgery

## 2019-06-08 ENCOUNTER — Encounter (HOSPITAL_COMMUNITY)
Admission: RE | Admit: 2019-06-08 | Discharge: 2019-06-08 | Disposition: A | Discharge status: Home / Self Care | Payer: BC Managed Care – PPO | Source: Ambulatory Visit | Attending: Orthopedic Surgery | Admitting: Orthopedic Surgery

## 2019-06-08 ENCOUNTER — Ambulatory Visit (HOSPITAL_COMMUNITY)
Admission: RE | Admit: 2019-06-08 | Discharge: 2019-06-08 | Disposition: A | Discharge status: Home / Self Care | Payer: BC Managed Care – PPO | Source: Ambulatory Visit | Attending: Orthopedic Surgery | Admitting: Orthopedic Surgery

## 2019-06-08 ENCOUNTER — Other Ambulatory Visit: Payer: Self-pay | Admitting: Orthopedic Surgery

## 2019-06-08 DIAGNOSIS — Z20822 Contact with and (suspected) exposure to covid-19: Secondary | ICD-10-CM | POA: Diagnosis not present

## 2019-06-08 DIAGNOSIS — Z01818 Encounter for other preprocedural examination: Secondary | ICD-10-CM | POA: Diagnosis not present

## 2019-06-08 DIAGNOSIS — Z01811 Encounter for preprocedural respiratory examination: Secondary | ICD-10-CM

## 2019-06-08 LAB — CBC WITH DIFFERENTIAL/PLATELET
Abs Immature Granulocytes: 0.03 10*3/uL (ref 0.00–0.07)
Basophils Absolute: 0.1 10*3/uL (ref 0.0–0.1)
Basophils Relative: 1 %
Eosinophils Absolute: 0.1 10*3/uL (ref 0.0–0.5)
Eosinophils Relative: 1 %
HCT: 42.6 % (ref 36.0–46.0)
Hemoglobin: 14.1 g/dL (ref 12.0–15.0)
Immature Granulocytes: 0 %
Lymphocytes Relative: 43 %
Lymphs Abs: 3.4 10*3/uL (ref 0.7–4.0)
MCH: 30.7 pg (ref 26.0–34.0)
MCHC: 33.1 g/dL (ref 30.0–36.0)
MCV: 92.6 fL (ref 80.0–100.0)
Monocytes Absolute: 0.4 10*3/uL (ref 0.1–1.0)
Monocytes Relative: 5 %
Neutro Abs: 4 10*3/uL (ref 1.7–7.7)
Neutrophils Relative %: 50 %
Platelets: 311 10*3/uL (ref 150–400)
RBC: 4.6 MIL/uL (ref 3.87–5.11)
RDW: 12.2 % (ref 11.5–15.5)
WBC: 7.9 10*3/uL (ref 4.0–10.5)
nRBC: 0 % (ref 0.0–0.2)

## 2019-06-08 LAB — COMPREHENSIVE METABOLIC PANEL
ALT: 20 U/L (ref 0–44)
AST: 18 U/L (ref 15–41)
Albumin: 3.8 g/dL (ref 3.5–5.0)
Alkaline Phosphatase: 55 U/L (ref 38–126)
Anion gap: 9 (ref 5–15)
BUN: 14 mg/dL (ref 6–20)
CO2: 25 mmol/L (ref 22–32)
Calcium: 8.7 mg/dL — ABNORMAL LOW (ref 8.9–10.3)
Chloride: 107 mmol/L (ref 98–111)
Creatinine, Ser: 0.61 mg/dL (ref 0.44–1.00)
GFR calc Af Amer: >60 mL/min (ref >60)
GFR calc non Af Amer: >60 mL/min (ref >60)
Glucose, Bld: 109 mg/dL — ABNORMAL HIGH (ref 70–99)
Potassium: 4 mmol/L (ref 3.5–5.1)
Sodium: 141 mmol/L (ref 135–145)
Total Bilirubin: 0.4 mg/dL (ref 0.3–1.2)
Total Protein: 7 g/dL (ref 6.5–8.1)

## 2019-06-08 LAB — ABO/RH: ABO/RH(D): A POS

## 2019-06-08 LAB — SURGICAL PCR SCREEN
MRSA, PCR: NEGATIVE
Staphylococcus aureus: NEGATIVE

## 2019-06-08 LAB — URINALYSIS, ROUTINE W REFLEX MICROSCOPIC
Bilirubin Urine: NEGATIVE
Glucose, UA: NEGATIVE mg/dL
Hgb urine dipstick: NEGATIVE
Ketones, ur: NEGATIVE mg/dL
Leukocytes,Ua: NEGATIVE
Nitrite: NEGATIVE
Protein, ur: NEGATIVE mg/dL
Specific Gravity, Urine: 1.02 (ref 1.005–1.030)
pH: 6 (ref 5.0–8.0)

## 2019-06-08 LAB — SARS CORONAVIRUS 2 (TAT 6-24 HRS): SARS Coronavirus 2: NEGATIVE

## 2019-06-08 LAB — PROTIME-INR
INR: 0.9 (ref 0.8–1.2)
Prothrombin Time: 12.4 seconds (ref 11.4–15.2)

## 2019-06-08 LAB — APTT: aPTT: 28 seconds (ref 24–36)

## 2019-06-08 NOTE — Addendum Note (Signed)
Addended by: Chalmers Cater on: 06/08/2019 08:05 AM   Modules accepted: Orders

## 2019-06-10 ENCOUNTER — Encounter (HOSPITAL_COMMUNITY): Payer: Self-pay | Admitting: Orthopedic Surgery

## 2019-06-10 MED ORDER — BUPIVACAINE LIPOSOME 1.3 % IJ SUSP
20.0000 mL | Freq: Once | INTRAMUSCULAR | Status: DC
  Filled 2019-06-10: qty 20

## 2019-06-10 NOTE — Progress Notes (Signed)
Ms. Kentner made aware to arrive at 6:15 AM and drink pre surgery Ensure at 5:45 AM, verbalized understanding.

## 2019-06-10 NOTE — Anesthesia Preprocedure Evaluation (Addendum)
Anesthesia Evaluation  Patient identified by MRN, date of birth, ID band Patient awake    Reviewed: Allergy & Precautions, NPO status , Patient's Chart, lab work & pertinent test results, reviewed documented beta blocker date and time   History of Anesthesia Complications (+) PONV and history of anesthetic complications  Airway Mallampati: III  TM Distance: >3 FB Neck ROM: Full    Dental no notable dental hx. (+) Dental Advisory Given   Pulmonary sleep apnea and Continuous Positive Airway Pressure Ventilation ,    Pulmonary exam normal        Cardiovascular hypertension, Pt. on home beta blockers Normal cardiovascular exam     Neuro/Psych  Headaches, negative psych ROS   GI/Hepatic negative GI ROS, Neg liver ROS,   Endo/Other  negative endocrine ROSdiabetes  Renal/GU Renal disease     Musculoskeletal negative musculoskeletal ROS (+)   Abdominal   Peds  Hematology negative hematology ROS (+)   Anesthesia Other Findings Day of surgery medications reviewed with the patient.  Reproductive/Obstetrics                            Anesthesia Physical Anesthesia Plan  ASA: II  Anesthesia Plan: Spinal   Post-op Pain Management:  Regional for Post-op pain   Induction:   PONV Risk Score and Plan: 3 and Ondansetron, Propofol infusion and Midazolam  Airway Management Planned: Natural Airway  Additional Equipment:   Intra-op Plan:   Post-operative Plan:   Informed Consent: I have reviewed the patients History and Physical, chart, labs and discussed the procedure including the risks, benefits and alternatives for the proposed anesthesia with the patient or authorized representative who has indicated his/her understanding and acceptance.     Dental advisory given  Plan Discussed with: Anesthesiologist and CRNA  Anesthesia Plan Comments:        Anesthesia Quick Evaluation

## 2019-06-11 ENCOUNTER — Encounter (HOSPITAL_COMMUNITY): Payer: Self-pay | Admitting: Orthopedic Surgery

## 2019-06-11 ENCOUNTER — Ambulatory Visit (HOSPITAL_COMMUNITY): Payer: BC Managed Care – PPO | Admitting: Anesthesiology

## 2019-06-11 ENCOUNTER — Other Ambulatory Visit: Payer: Self-pay

## 2019-06-11 ENCOUNTER — Encounter (HOSPITAL_COMMUNITY)
Admission: RE | Disposition: A | Discharge status: Home / Self Care | Payer: Self-pay | Source: Home / Self Care | Attending: Orthopedic Surgery

## 2019-06-11 ENCOUNTER — Ambulatory Visit (HOSPITAL_COMMUNITY)
Admission: RE | Admit: 2019-06-11 | Discharge: 2019-06-12 | Disposition: A | Discharge status: Home / Self Care | Payer: BC Managed Care – PPO | Attending: Orthopedic Surgery | Admitting: Orthopedic Surgery

## 2019-06-11 DIAGNOSIS — G4733 Obstructive sleep apnea (adult) (pediatric): Secondary | ICD-10-CM | POA: Diagnosis not present

## 2019-06-11 DIAGNOSIS — Z9889 Other specified postprocedural states: Secondary | ICD-10-CM

## 2019-06-11 DIAGNOSIS — M1712 Unilateral primary osteoarthritis, left knee: Secondary | ICD-10-CM | POA: Diagnosis present

## 2019-06-11 HISTORY — PX: TOTAL KNEE ARTHROPLASTY: SHX125

## 2019-06-11 LAB — TYPE AND SCREEN
ABO/RH(D): A POS
Antibody Screen: NEGATIVE

## 2019-06-11 LAB — PREGNANCY, URINE: Preg Test, Ur: NEGATIVE

## 2019-06-11 SURGERY — ARTHROPLASTY, KNEE, TOTAL
Anesthesia: Spinal | Site: Knee | Laterality: Left

## 2019-06-11 MED ORDER — DOCUSATE SODIUM 100 MG PO CAPS
100.0000 mg | ORAL_CAPSULE | Freq: Two times a day (BID) | ORAL | Status: DC
  Administered 2019-06-11 – 2019-06-12 (×2): 100 mg via ORAL
  Filled 2019-06-11 (×3): qty 1

## 2019-06-11 MED ORDER — ONDANSETRON HCL 4 MG/2ML IJ SOLN
INTRAMUSCULAR | Status: AC
  Filled 2019-06-11: qty 4

## 2019-06-11 MED ORDER — BUPIVACAINE LIPOSOME 1.3 % IJ SUSP
INTRAMUSCULAR | Status: DC | PRN
  Administered 2019-06-11: 20 mL

## 2019-06-11 MED ORDER — PROPOFOL 1000 MG/100ML IV EMUL
INTRAVENOUS | Status: AC
  Filled 2019-06-11: qty 100

## 2019-06-11 MED ORDER — GABAPENTIN 300 MG PO CAPS
300.0000 mg | ORAL_CAPSULE | Freq: Two times a day (BID) | ORAL | Status: DC
  Administered 2019-06-11 – 2019-06-12 (×2): 300 mg via ORAL
  Filled 2019-06-11 (×2): qty 1

## 2019-06-11 MED ORDER — LACTATED RINGERS IV BOLUS
250.0000 mL | Freq: Once | INTRAVENOUS | Status: AC
  Administered 2019-06-11: 250 mL via INTRAVENOUS

## 2019-06-11 MED ORDER — GLYCOPYRROLATE PF 0.2 MG/ML IJ SOSY
PREFILLED_SYRINGE | INTRAMUSCULAR | Status: DC | PRN
  Administered 2019-06-11: .2 mg via INTRAVENOUS

## 2019-06-11 MED ORDER — OXYCODONE-ACETAMINOPHEN 5-325 MG PO TABS: 1.0000 | ORAL_TABLET | Freq: Four times a day (QID) | ORAL | 0 refills | Status: DC | PRN

## 2019-06-11 MED ORDER — TRANEXAMIC ACID-NACL 1000-0.7 MG/100ML-% IV SOLN
1000.0000 mg | INTRAVENOUS | Status: AC
  Administered 2019-06-11: 1000 mg via INTRAVENOUS
  Filled 2019-06-11: qty 100

## 2019-06-11 MED ORDER — ROPIVACAINE HCL 7.5 MG/ML IJ SOLN
INTRAMUSCULAR | Status: DC | PRN
  Administered 2019-06-11: 20 mL via PERINEURAL

## 2019-06-11 MED ORDER — FENTANYL CITRATE (PF) 100 MCG/2ML IJ SOLN
50.0000 ug | Freq: Once | INTRAMUSCULAR | Status: AC
  Administered 2019-06-11: 100 ug via INTRAVENOUS
  Filled 2019-06-11: qty 2

## 2019-06-11 MED ORDER — PROPOFOL 10 MG/ML IV BOLUS
INTRAVENOUS | Status: AC
  Filled 2019-06-11: qty 40

## 2019-06-11 MED ORDER — DEXAMETHASONE SODIUM PHOSPHATE 10 MG/ML IJ SOLN
INTRAMUSCULAR | Status: DC | PRN
  Administered 2019-06-11: 8 mg via INTRAVENOUS

## 2019-06-11 MED ORDER — OXYCODONE HCL 5 MG PO TABS
ORAL_TABLET | ORAL | Status: AC
  Filled 2019-06-11: qty 1

## 2019-06-11 MED ORDER — PROMETHAZINE HCL 25 MG/ML IJ SOLN: 6.2500 mg | INTRAMUSCULAR | Status: DC | PRN

## 2019-06-11 MED ORDER — LACTATED RINGERS IV SOLN: INTRAVENOUS | Status: DC

## 2019-06-11 MED ORDER — OXYCODONE HCL 5 MG PO TABS
5.0000 mg | ORAL_TABLET | ORAL | Status: DC | PRN
  Administered 2019-06-11 – 2019-06-12 (×2): 5 mg via ORAL
  Filled 2019-06-11: qty 1

## 2019-06-11 MED ORDER — PROPOFOL 500 MG/50ML IV EMUL
INTRAVENOUS | Status: DC | PRN
  Administered 2019-06-11: 85 ug/kg/min via INTRAVENOUS

## 2019-06-11 MED ORDER — TRANEXAMIC ACID-NACL 1000-0.7 MG/100ML-% IV SOLN: 1000.0000 mg | Freq: Once | INTRAVENOUS | Status: AC

## 2019-06-11 MED ORDER — HYDROMORPHONE HCL 1 MG/ML IJ SOLN: 0.5000 mg | INTRAMUSCULAR | Status: DC | PRN

## 2019-06-11 MED ORDER — CEFAZOLIN SODIUM-DEXTROSE 2-4 GM/100ML-% IV SOLN
INTRAVENOUS | Status: AC
  Administered 2019-06-11: 2 g via INTRAVENOUS
  Filled 2019-06-11: qty 100

## 2019-06-11 MED ORDER — ASPIRIN EC 325 MG PO TBEC
325.0000 mg | DELAYED_RELEASE_TABLET | Freq: Two times a day (BID) | ORAL | Status: DC
  Administered 2019-06-12: 325 mg via ORAL
  Filled 2019-06-11 (×2): qty 1

## 2019-06-11 MED ORDER — DOCUSATE SODIUM 100 MG PO CAPS: 100.0000 mg | ORAL_CAPSULE | Freq: Two times a day (BID) | ORAL | 0 refills | Status: DC

## 2019-06-11 MED ORDER — BUPIVACAINE-EPINEPHRINE 0.5% -1:200000 IJ SOLN
INTRAMUSCULAR | Status: AC
  Filled 2019-06-11: qty 1

## 2019-06-11 MED ORDER — ONDANSETRON HCL 4 MG/2ML IJ SOLN
INTRAMUSCULAR | Status: DC | PRN
  Administered 2019-06-11 (×2): 4 mg via INTRAVENOUS

## 2019-06-11 MED ORDER — LIDOCAINE 2% (20 MG/ML) 5 ML SYRINGE
INTRAMUSCULAR | Status: DC | PRN
  Administered 2019-06-11: 60 mg via INTRAVENOUS

## 2019-06-11 MED ORDER — SODIUM CHLORIDE 0.9 % IR SOLN
Status: DC | PRN
  Administered 2019-06-11: 1000 mL

## 2019-06-11 MED ORDER — DEXAMETHASONE SODIUM PHOSPHATE 10 MG/ML IJ SOLN: 10.0000 mg | Freq: Once | INTRAMUSCULAR | Status: DC

## 2019-06-11 MED ORDER — SODIUM CHLORIDE (PF) 0.9 % IJ SOLN
INTRAMUSCULAR | Status: AC
  Filled 2019-06-11: qty 50

## 2019-06-11 MED ORDER — ASPIRIN EC 325 MG PO TBEC: 325.0000 mg | DELAYED_RELEASE_TABLET | Freq: Two times a day (BID) | ORAL | 0 refills | Status: DC

## 2019-06-11 MED ORDER — DIPHENHYDRAMINE HCL 12.5 MG/5ML PO ELIX
12.5000 mg | ORAL_SOLUTION | ORAL | Status: DC | PRN
  Filled 2019-06-11: qty 10

## 2019-06-11 MED ORDER — TRANEXAMIC ACID-NACL 1000-0.7 MG/100ML-% IV SOLN
INTRAVENOUS | Status: AC
  Administered 2019-06-11: 1000 mg via INTRAVENOUS
  Filled 2019-06-11: qty 100

## 2019-06-11 MED ORDER — CEFAZOLIN SODIUM-DEXTROSE 2-4 GM/100ML-% IV SOLN
2.0000 g | INTRAVENOUS | Status: AC
  Administered 2019-06-11: 2 g via INTRAVENOUS
  Filled 2019-06-11: qty 100

## 2019-06-11 MED ORDER — CELECOXIB 200 MG PO CAPS
200.0000 mg | ORAL_CAPSULE | Freq: Once | ORAL | Status: AC
  Administered 2019-06-11: 200 mg via ORAL
  Filled 2019-06-11: qty 1

## 2019-06-11 MED ORDER — LACTATED RINGERS IV BOLUS
500.0000 mL | Freq: Once | INTRAVENOUS | Status: AC
  Administered 2019-06-11: 500 mL via INTRAVENOUS

## 2019-06-11 MED ORDER — METHOCARBAMOL 500 MG IVPB - SIMPLE MED
500.0000 mg | Freq: Four times a day (QID) | INTRAVENOUS | Status: DC | PRN
  Administered 2019-06-11: 500 mg via INTRAVENOUS
  Filled 2019-06-11: qty 50

## 2019-06-11 MED ORDER — BUPIVACAINE-EPINEPHRINE 0.5% -1:200000 IJ SOLN
INTRAMUSCULAR | Status: DC | PRN
  Administered 2019-06-11: 30 mL

## 2019-06-11 MED ORDER — OXYCODONE HCL 5 MG PO TABS
ORAL_TABLET | ORAL | Status: AC
  Administered 2019-06-11: 5 mg via ORAL
  Filled 2019-06-11: qty 1

## 2019-06-11 MED ORDER — WATER FOR IRRIGATION, STERILE IR SOLN
Status: DC | PRN
  Administered 2019-06-11 (×2): 1000 mL

## 2019-06-11 MED ORDER — PHENYLEPHRINE HCL-NACL 10-0.9 MG/250ML-% IV SOLN
INTRAVENOUS | Status: DC | PRN
  Administered 2019-06-11: 15 ug/min via INTRAVENOUS

## 2019-06-11 MED ORDER — METHOCARBAMOL 500 MG PO TABS
500.0000 mg | ORAL_TABLET | Freq: Four times a day (QID) | ORAL | Status: DC | PRN
  Administered 2019-06-12: 500 mg via ORAL
  Filled 2019-06-11: qty 1

## 2019-06-11 MED ORDER — CHLORHEXIDINE GLUCONATE 4 % EX LIQD: 60.0000 mL | Freq: Once | CUTANEOUS | Status: DC

## 2019-06-11 MED ORDER — SCOPOLAMINE 1 MG/3DAYS TD PT72
1.0000 | MEDICATED_PATCH | TRANSDERMAL | Status: DC
  Administered 2019-06-11: 1.5 mg via TRANSDERMAL
  Filled 2019-06-11: qty 1

## 2019-06-11 MED ORDER — ONDANSETRON HCL 4 MG/2ML IJ SOLN: 4.0000 mg | Freq: Four times a day (QID) | INTRAMUSCULAR | Status: DC | PRN

## 2019-06-11 MED ORDER — DEXAMETHASONE SODIUM PHOSPHATE 10 MG/ML IJ SOLN
10.0000 mg | Freq: Once | INTRAMUSCULAR | Status: AC
  Administered 2019-06-12: 10 mg via INTRAVENOUS
  Filled 2019-06-11: qty 1

## 2019-06-11 MED ORDER — 0.9 % SODIUM CHLORIDE (POUR BTL) OPTIME
TOPICAL | Status: DC | PRN
  Administered 2019-06-11: 09:00:00 1000 mL

## 2019-06-11 MED ORDER — POVIDONE-IODINE 10 % EX SWAB
2.0000 "application " | Freq: Once | CUTANEOUS | Status: AC
  Administered 2019-06-11: 2 via TOPICAL

## 2019-06-11 MED ORDER — LIDOCAINE 2% (20 MG/ML) 5 ML SYRINGE
INTRAMUSCULAR | Status: AC
  Filled 2019-06-11: qty 10

## 2019-06-11 MED ORDER — ACETAMINOPHEN 325 MG PO TABS
ORAL_TABLET | ORAL | Status: AC
  Administered 2019-06-11: 650 mg via ORAL
  Filled 2019-06-11: qty 2

## 2019-06-11 MED ORDER — SODIUM CHLORIDE 0.9% FLUSH
INTRAVENOUS | Status: DC | PRN
  Administered 2019-06-11: 50 mL

## 2019-06-11 MED ORDER — BUPIVACAINE IN DEXTROSE 0.75-8.25 % IT SOLN
INTRATHECAL | Status: DC | PRN
  Administered 2019-06-11: 1.4 mL via INTRATHECAL

## 2019-06-11 MED ORDER — ONDANSETRON HCL 4 MG PO TABS
4.0000 mg | ORAL_TABLET | Freq: Four times a day (QID) | ORAL | Status: DC | PRN
  Filled 2019-06-11: qty 1

## 2019-06-11 MED ORDER — ACETAMINOPHEN 325 MG PO TABS
325.0000 mg | ORAL_TABLET | Freq: Four times a day (QID) | ORAL | Status: DC | PRN
  Administered 2019-06-12: 650 mg via ORAL
  Filled 2019-06-11: qty 2

## 2019-06-11 MED ORDER — MIDAZOLAM HCL 2 MG/2ML IJ SOLN
1.0000 mg | Freq: Once | INTRAMUSCULAR | Status: AC
  Administered 2019-06-11: 2 mg via INTRAVENOUS
  Filled 2019-06-11: qty 2

## 2019-06-11 MED ORDER — TIZANIDINE HCL 4 MG PO TABS: 4.0000 mg | ORAL_TABLET | Freq: Three times a day (TID) | ORAL | 0 refills | Status: DC | PRN

## 2019-06-11 MED ORDER — PHENYLEPHRINE HCL-NACL 10-0.9 MG/250ML-% IV SOLN
INTRAVENOUS | Status: AC
  Filled 2019-06-11: qty 500

## 2019-06-11 MED ORDER — DEXAMETHASONE SODIUM PHOSPHATE 10 MG/ML IJ SOLN
INTRAMUSCULAR | Status: AC
  Filled 2019-06-11: qty 2

## 2019-06-11 MED ORDER — PROPOFOL 10 MG/ML IV BOLUS
INTRAVENOUS | Status: DC | PRN
  Administered 2019-06-11: 10 mg via INTRAVENOUS
  Administered 2019-06-11: 20 mg via INTRAVENOUS

## 2019-06-11 MED ORDER — METHOCARBAMOL 500 MG PO TABS
ORAL_TABLET | ORAL | Status: AC
  Administered 2019-06-11: 500 mg via ORAL
  Filled 2019-06-11: qty 1

## 2019-06-11 MED ORDER — ACETAMINOPHEN 500 MG PO TABS
1000.0000 mg | ORAL_TABLET | Freq: Once | ORAL | Status: AC
  Administered 2019-06-11: 1000 mg via ORAL
  Filled 2019-06-11: qty 2

## 2019-06-11 MED ORDER — HYDROMORPHONE HCL 1 MG/ML IJ SOLN: 0.2500 mg | INTRAMUSCULAR | Status: DC | PRN

## 2019-06-11 MED ORDER — GLYCOPYRROLATE PF 0.2 MG/ML IJ SOSY
PREFILLED_SYRINGE | INTRAMUSCULAR | Status: AC
  Filled 2019-06-11: qty 2

## 2019-06-11 MED ORDER — METHOCARBAMOL 500 MG IVPB - SIMPLE MED
INTRAVENOUS | Status: AC
  Filled 2019-06-11: qty 50

## 2019-06-11 MED ORDER — SODIUM CHLORIDE 0.9 % IV SOLN: INTRAVENOUS | Status: DC

## 2019-06-11 MED ORDER — CEFAZOLIN SODIUM-DEXTROSE 2-4 GM/100ML-% IV SOLN: 2.0000 g | Freq: Once | INTRAVENOUS | Status: AC

## 2019-06-11 SURGICAL SUPPLY — 50 items
ATTUNE PS FEM LT SZ 4 CEM KNEE (Femur) ×1 IMPLANT
ATTUNE PSRP INSR SZ4 5 KNEE (Insert) ×1 IMPLANT
BAG ZIPLOCK 12X15 (MISCELLANEOUS) ×2 IMPLANT
BASEPLATE TIBIAL ROTATING SZ 4 (Knees) ×1 IMPLANT
BENZOIN TINCTURE PRP APPL 2/3 (GAUZE/BANDAGES/DRESSINGS) ×2 IMPLANT
BLADE SAGITTAL 25.0X1.19X90 (BLADE) ×2 IMPLANT
BLADE SAW SGTL 13.0X1.19X90.0M (BLADE) ×2 IMPLANT
BLADE SURG SZ10 CARB STEEL (BLADE) ×4 IMPLANT
BNDG ELASTIC 6X5.8 VLCR STR LF (GAUZE/BANDAGES/DRESSINGS) ×2 IMPLANT
BOOTIES KNEE HIGH SLOAN (MISCELLANEOUS) ×2 IMPLANT
BOWL SMART MIX CTS (DISPOSABLE) ×2 IMPLANT
CEMENT HV SMART SET (Cement) ×4 IMPLANT
COVER SURGICAL LIGHT HANDLE (MISCELLANEOUS) ×2 IMPLANT
COVER WAND RF STERILE (DRAPES) ×1 IMPLANT
CUFF TOURN SGL QUICK 34 (TOURNIQUET CUFF) ×2
CUFF TRNQT CYL 34X4.125X (TOURNIQUET CUFF) ×1 IMPLANT
DECANTER SPIKE VIAL GLASS SM (MISCELLANEOUS) ×4 IMPLANT
DRAPE U-SHAPE 47X51 STRL (DRAPES) ×2 IMPLANT
DRSG AQUACEL AG ADV 3.5X10 (GAUZE/BANDAGES/DRESSINGS) ×2 IMPLANT
DURAPREP 26ML APPLICATOR (WOUND CARE) ×2 IMPLANT
ELECT REM PT RETURN 15FT ADLT (MISCELLANEOUS) ×2 IMPLANT
GLOVE BIOGEL PI IND STRL 8 (GLOVE) ×2 IMPLANT
GLOVE BIOGEL PI INDICATOR 8 (GLOVE) ×2
GLOVE ECLIPSE 7.5 STRL STRAW (GLOVE) ×4 IMPLANT
GOWN STRL REUS W/TWL XL LVL3 (GOWN DISPOSABLE) ×4 IMPLANT
HANDPIECE INTERPULSE COAX TIP (DISPOSABLE) ×2
HOLDER FOLEY CATH W/STRAP (MISCELLANEOUS) ×1 IMPLANT
HOOD PEEL AWAY FLYTE STAYCOOL (MISCELLANEOUS) ×6 IMPLANT
KIT TURNOVER KIT A (KITS) IMPLANT
MANIFOLD NEPTUNE II (INSTRUMENTS) ×2 IMPLANT
NEEDLE HYPO 22GX1.5 SAFETY (NEEDLE) ×2 IMPLANT
NS IRRIG 1000ML POUR BTL (IV SOLUTION) ×2 IMPLANT
PACK ICE MAXI GEL EZY WRAP (MISCELLANEOUS) ×2 IMPLANT
PACK TOTAL KNEE CUSTOM (KITS) ×2 IMPLANT
PADDING CAST COTTON 6X4 STRL (CAST SUPPLIES) ×2 IMPLANT
PATELLA MEDIAL ATTUN 35MM KNEE (Knees) ×1 IMPLANT
PENCIL SMOKE EVACUATOR (MISCELLANEOUS) IMPLANT
PIN DRILL FIX HALF THREAD (BIT) ×1 IMPLANT
PIN STEINMAN FIXATION KNEE (PIN) ×1 IMPLANT
PROTECTOR NERVE ULNAR (MISCELLANEOUS) ×2 IMPLANT
SET HNDPC FAN SPRY TIP SCT (DISPOSABLE) ×1 IMPLANT
STRIP CLOSURE SKIN 1/2X4 (GAUZE/BANDAGES/DRESSINGS) ×1 IMPLANT
SUT MNCRL AB 3-0 PS2 18 (SUTURE) ×2 IMPLANT
SUT VIC AB 0 CT1 36 (SUTURE) ×2 IMPLANT
SUT VIC AB 1 CT1 36 (SUTURE) ×4 IMPLANT
SYR CONTROL 10ML LL (SYRINGE) ×4 IMPLANT
TRAY FOLEY MTR SLVR 16FR STAT (SET/KITS/TRAYS/PACK) ×2 IMPLANT
WATER STERILE IRR 1000ML POUR (IV SOLUTION) ×4 IMPLANT
WRAP KNEE MAXI GEL POST OP (GAUZE/BANDAGES/DRESSINGS) ×1 IMPLANT
YANKAUER SUCT BULB TIP 10FT TU (MISCELLANEOUS) ×2 IMPLANT

## 2019-06-11 NOTE — H&P (Signed)
TOTAL KNEE ADMISSION H&P  Patient is being admitted for left total knee arthroplasty.  Subjective:  Chief Complaint:left knee pain.  HPI: Briana Parks, 54 y.o. female, has a history of pain and functional disability in the left knee due to arthritis and has failed non-surgical conservative treatments for greater than 12 weeks to includeNSAID's and/or analgesics, corticosteriod injections, viscosupplementation injections, weight reduction as appropriate and activity modification.  Onset of symptoms was gradual, starting 8 years ago with gradually worsening course since that time. The patient noted no past surgery on the left knee(s).  Patient currently rates pain in the left knee(s) at 8 out of 10 with activity. Patient has night pain, worsening of pain with activity and weight bearing, pain that interferes with activities of daily living, pain with passive range of motion and joint swelling.  Patient has evidence of subchondral sclerosis, periarticular osteophytes and joint space narrowing by imaging studies. This patient has had Failure of all reasonable conservative care. There is no active infection.  Patient Active Problem List   Diagnosis Date Noted  . Primary osteoarthritis of first carpometacarpal joint of left hand 01/26/2019  . Abnormal MRI of head 10/03/2015  . Primary osteoarthritis of left knee 10/26/2013  . Headache 09/30/2013  . Obstructive sleep apnea 7cm water pressure CPAP 10/24/2010  . Rosacea 09/13/2010  . ALLERGIC RHINITIS CAUSE UNSPECIFIED 08/03/2008  . SPONDYLOLISTHESIS, ACQUIRED 05/21/2006  . SCOLIOSIS 12/31/2005  . SHOULDER/ARM SPRAIN/STRAIN, UNSPEC. 12/31/2005   Past Medical History:  Diagnosis Date  . ALLERGIC RHINITIS CAUSE UNSPECIFIED 08/03/2008   Qualifier: Diagnosis of  By: Esmeralda Arthur    . Arachnoid cyst   . Deviated septum    large turbinates  . Gestational diabetes   . Headache 09/30/2013  . 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 Kidney  infection   . Low blood pressure   . Obstructive sleep apnea 7cm water pressure CPAP 10/24/2010   Moderate. Sleep study was performed at [facility] neurological clinic by Dr. Pecolia Ades on 10/02/2010. AHI was 19.8 with desaturations to 79%. Frequent severe snoring. Moderate wedge works associated with respiratory events. Difficulty initiating and maintaining sleep with no slow wave sleep. She had severe OSA and REM.   . Osteoarthritis of left knee 10/26/2013  . Plantar fasciitis   . Pneumonia    history of  . PONV (postoperative nausea and vomiting)    hard to wake up after anesthesia, did not get PONV after 2017 surgery at [facility]. notes care everywhere  . Recurrent UTI   . Rosacea 09/13/2010  . Scoliosis   . Tinnitus   . Vertigo     Past Surgical History:  Procedure Laterality Date  . bone spur removal Right 02/17/2012   foot  . bone spur removal Left 08/2015  . HERNIA REPAIR Bilateral   . KNEE ARTHROSCOPY Left 03/16/14   Dr. [facility]   . TONSILLECTOMY AND ADENOIDECTOMY    . TUBAL LIGATION      Current Facility-Administered Medications  Medication Dose Route Frequency Provider Last Rate Last Admin  . bupivacaine liposome (EXPAREL) 1.3 % injection 266 mg  20 mL Other Once Dorna Leitz, MD      . ceFAZolin (ANCEF) IVPB 2g/100 mL premix  2 g Intravenous On Call to OR Dorna Leitz, MD      . chlorhexidine (HIBICLENS) 4 % liquid 4 application  60 mL Topical Once Dorna Leitz, MD      . fentaNYL (SUBLIMAZE) injection 50-100  mcg  50-100 mcg Intravenous Once Elisheba Roberts, MD      . lactated ringers infusion   Intravenous Continuous Ahaana Roberts, MD 50 mL/hr at 06/11/19 0656 New Bag at 06/11/19 0656  . midazolam (VERSED) injection 1-2 mg  1-2 mg Intravenous Once Ahlana Roberts, MD      . phenylephrine (NEOSYNEPHRINE) 10-0.9 MG/250ML-% infusion           . scopolamine (TRANSDERM-SCOP) 1 MG/3DAYS 1.5 mg  1 patch Transdermal Q72H Bridie Roberts, MD   1.5 mg at 06/11/19  0707  . tranexamic acid (CYKLOKAPRON) IVPB 1,000 mg  1,000 mg Intravenous To OR Jodi Geralds, MD       Allergies  Allergen Reactions  . Eggs Or Egg-Derived Products   . Sulfamethoxazole-Trimethoprim Hives  . Tdap [Tetanus-Diphth-Acell Pertussis] Other (See Comments)    Skin reaction with lymph node sweeling    Social History   Tobacco Use  . Smoking status: Never Smoker  . Smokeless tobacco: Never Used  Substance Use Topics  . Alcohol use: Yes    Comment: mixed drinks    Family History  Problem Relation Age of Onset  . Arthritis Mother   . Fibromyalgia Mother   . Cancer Mother        liver small intestine carcinoid tumor     Review of Systems ROS: I have reviewed the patient's review of systems thoroughly and there are no positive responses as relates to the HPI. Objective:  Physical Exam  Vital signs in last 24 hours: Temp:  [98.8 F (37.1 C)] 98.8 F (37.1 C) (03/19 0646) Pulse Rate:  [74] 74 (03/19 0646) Resp:  [16] 16 (03/19 0646) BP: (140)/(88) 140/88 (03/19 0646) SpO2:  [96 %] 96 % (03/19 0646) Well-developed well-nourished patient in no acute distress. Alert and oriented x3 HEENT:within normal limits Cardiac: Regular rate and rhythm Pulmonary: Lungs clear to auscultation Abdomen: Soft and nontender.  Normal active bowel sounds  Musculoskeletal: (Left knee: Painful range of motion.  Limited range of motion.  No instability.  Trace effusion.  Neurovascular intact distally. Labs: Recent Results (from the past 2160 hour(s))  Surgical pcr screen     Status: None   Collection Time: 06/08/19  9:53 AM   Specimen: Nasal Mucosa; Nasal Swab  Result Value Ref Range   MRSA, PCR NEGATIVE NEGATIVE   Staphylococcus aureus NEGATIVE NEGATIVE    Comment: (NOTE) The Xpert SA Assay (FDA approved for NASAL specimens in patients 51 years of age and older), is one component of a comprehensive surveillance program. It is not intended to diagnose infection nor to guide or  monitor treatment. Performed at [facility], [address], [place], [place] [address]   APTT     Status: None   Collection Time: 06/08/19  9:53 AM  Result Value Ref Range   aPTT 28 24 - 36 seconds    Comment: Performed at [facility], [address] [place], [place] [address]  CBC WITH DIFFERENTIAL     Status: None   Collection Time: 06/08/19  9:53 AM  Result Value Ref Range   WBC 7.9 4.0 - 10.5 K/uL   RBC 4.60 3.87 - 5.11 MIL/uL   Hemoglobin 14.1 12.0 - 15.0 g/dL   HCT 09.8 11.9 - 14.7 %   MCV 92.6 80.0 - 100.0 fL   MCH 30.7 26.0 - 34.0 pg   MCHC 33.1 30.0 - 36.0 g/dL   RDW 82.9 56.2 - 13.0 %   Platelets 311  150 - 400 K/uL   nRBC 0.0 0.0 - 0.2 %   Neutrophils Relative % 50 %   Neutro Abs 4.0 1.7 - 7.7 K/uL   Lymphocytes Relative 43 %   Lymphs Abs 3.4 0.7 - 4.0 K/uL   Monocytes Relative 5 %   Monocytes Absolute 0.4 0.1 - 1.0 K/uL   Eosinophils Relative 1 %   Eosinophils Absolute 0.1 0.0 - 0.5 K/uL   Basophils Relative 1 %   Basophils Absolute 0.1 0.0 - 0.1 K/uL   Immature Granulocytes 0 %   Abs Immature Granulocytes 0.03 0.00 - 0.07 K/uL    Comment: Performed at [facility], [address], [place], [place] [address]  Comprehensive metabolic panel     Status: Abnormal   Collection Time: 06/08/19  9:53 AM  Result Value Ref Range   Sodium 141 135 - 145 mmol/L   Potassium 4.0 3.5 - 5.1 mmol/L   Chloride 107 98 - 111 mmol/L   CO2 25 22 - 32 mmol/L   Glucose, Bld 109 (H) 70 - 99 mg/dL    Comment: Glucose reference range applies only to samples taken after fasting for at least 8 hours.   BUN 14 6 - 20 mg/dL   Creatinine, Ser 8.25 0.44 - 1.00 mg/dL   Calcium 8.7 (L) 8.9 - 10.3 mg/dL   Total Protein 7.0 6.5 - 8.1 g/dL   Albumin 3.8 3.5 - 5.0 g/dL   AST 18 15 - 41 U/L   ALT 20 0 - 44 U/L   Alkaline Phosphatase 55 38 - 126 U/L   Total Bilirubin 0.4 0.3 - 1.2 mg/dL   GFR calc non Af Amer >60 >60 mL/min   GFR  calc Af Amer >60 >60 mL/min   Anion gap 9 5 - 15    Comment: Performed at [facility], [address] [place], [place] [address]  Protime-INR     Status: None   Collection Time: 06/08/19  9:53 AM  Result Value Ref Range   Prothrombin Time 12.4 11.4 - 15.2 seconds   INR 0.9 0.8 - 1.2    Comment: (NOTE) INR goal varies based on device and disease states. Performed at [facility], [address], [place], [place] [address]   Type and screen Order type and screen if day of surgery is less than 15 days from draw of preadmission visit or order morning of surgery if day of surgery is greater than 6 days from preadmission visit.     Status: None   Collection Time: 06/08/19  9:53 AM  Result Value Ref Range   ABO/RH(D) A POS    Antibody Screen NEG    Sample Expiration 06/22/2019,2359    Extend sample reason      NO TRANSFUSIONS OR PREGNANCY IN THE PAST 3 MONTHS Performed at [facility], [address] [place], [place] [address]   Urinalysis, Routine w reflex microscopic     Status: Abnormal   Collection Time: 06/08/19  9:53 AM  Result Value Ref Range   Color, Urine AMBER (A) YELLOW    Comment: BIOCHEMICALS MAY BE AFFECTED BY COLOR   APPearance CLEAR CLEAR   Specific Gravity, Urine 1.020 1.005 - 1.030   pH 6.0 5.0 - 8.0   Glucose, UA NEGATIVE NEGATIVE mg/dL   Hgb urine dipstick NEGATIVE NEGATIVE   Bilirubin Urine NEGATIVE NEGATIVE   Ketones, ur NEGATIVE NEGATIVE mg/dL   Protein, ur NEGATIVE NEGATIVE mg/dL  Nitrite NEGATIVE NEGATIVE   Leukocytes,Ua NEGATIVE NEGATIVE    Comment: Performed at [facility], [address], [place], [place] [address]  ABO/Rh     Status: None   Collection Time: 06/08/19  9:53 AM  Result Value Ref Range   ABO/RH(D)      A POS Performed at [facility], [address], [place], [place] [address]   SARS CORONAVIRUS 2 (TAT 6-24 HRS) Nasopharyngeal  Nasopharyngeal Swab     Status: None   Collection Time: 06/08/19 10:45 AM   Specimen: Nasopharyngeal Swab  Result Value Ref Range   SARS Coronavirus 2 NEGATIVE NEGATIVE    Comment: (NOTE) SARS-CoV-2 target nucleic acids are NOT DETECTED. The SARS-CoV-2 RNA is generally detectable in upper and lower respiratory specimens during the acute phase of infection. Negative results do not preclude SARS-CoV-2 infection, do not rule out co-infections with other pathogens, and should not be used as the sole basis for treatment or other patient management decisions. Negative results must be combined with clinical observations, patient history, and epidemiological information. The expected result is Negative. Fact Sheet for Patients: HairSlick.no Fact Sheet for Healthcare Providers: quierodirigir.com This test is not yet approved or cleared by the [place] FDA and  has been authorized for detection and/or diagnosis of SARS-CoV-2 by FDA under an Emergency Use Authorization (EUA). This EUA will remain  in effect (meaning this test can be used) for the duration of the COVID-19 declaration under Section 56 4(b)(1) of the Act, 21 U.S.C. section 360bbb-3(b)(1), unless the authorization is terminated or revoked sooner. Performed at [facility], [address] [place], [place] [address]     Estimated body mass index is 35.74 kg/m as calculated from the following:   Height as of 06/08/19: 5\' 6"  (1.676 m).   Weight as of 06/08/19: 100.4 kg.   Imaging Review Plain radiographs demonstrate severe degenerative joint disease of the left knee(s). The overall alignment ismild varus. The bone quality appears to be fair for age and reported activity level.      Assessment/Plan:  End stage arthritis, left knee   The patient history, physical examination, clinical judgment of the provider and imaging studies are consistent with end stage  degenerative joint disease of the left knee(s) and total knee arthroplasty is deemed medically necessary. The treatment options including medical management, injection therapy arthroscopy and arthroplasty were discussed at length. The risks and benefits of total knee arthroplasty were presented and reviewed. The risks due to aseptic loosening, infection, stiffness, patella tracking problems, thromboembolic complications and other imponderables were discussed. The patient acknowledged the explanation, agreed to proceed with the plan and consent was signed. Patient is being admitted for inpatient treatment for surgery, pain control, PT, OT, prophylactic antibiotics, VTE prophylaxis, progressive ambulation and ADL's and discharge planning. The patient is planning to be discharged home with home health services     Patient's anticipated LOS is less than 2 midnights, meeting these requirements: - Younger than 63 - Lives within 1 hour of care - Has a competent adult at home to recover with post-op recover - NO history of  - Chronic pain requiring opiods  - Diabetes  - Coronary Artery Disease  - Heart failure  - Heart attack  - Stroke  - DVT/VTE  - Cardiac arrhythmia  - Respiratory Failure/COPD  - Renal failure  - Anemia  - Advanced Liver disease

## 2019-06-11 NOTE — Transfer of Care (Signed)
Immediate Anesthesia Transfer of Care Note  Patient: Briana Parks  Procedure(s) Performed: TOTAL KNEE ARTHROPLASTY (Left Knee)  Patient Location: PACU  Anesthesia Type:Spinal  Level of Consciousness: awake, alert , oriented and patient cooperative  Airway & Oxygen Therapy: Patient Spontanous Breathing and Patient connected to face mask oxygen  Post-op Assessment: Report given to RN and Post -op Vital signs reviewed and stable  Post vital signs: Reviewed and stable  Last Vitals:  Vitals Value Taken Time  BP 101/69 06/11/19 1035  Temp    Pulse 76 06/11/19 1040  Resp 11 06/11/19 1040  SpO2 98 % 06/11/19 1040  Vitals shown include unvalidated device data.  Last Pain:  Vitals:   06/11/19 1035  TempSrc:   PainSc: (P) Asleep         Complications: No apparent anesthesia complications

## 2019-06-11 NOTE — Progress Notes (Signed)
Assisted Dr. Singer with left, ultrasound guided, adductor canal block. Side rails up, monitors on throughout procedure. See vital signs in flow sheet. Tolerated Procedure well.  

## 2019-06-11 NOTE — Anesthesia Procedure Notes (Signed)
Spinal  Patient location during procedure: OR Start time: 06/11/2019 8:34 AM End time: 06/11/2019 8:44 AM Staffing Performed: anesthesiologist  Anesthesiologist: Dacie Roberts, MD Preanesthetic Checklist Completed: patient identified, IV checked, risks and benefits discussed, surgical consent, monitors and equipment checked, pre-op evaluation and timeout performed Spinal Block Patient position: sitting Prep: DuraPrep Patient monitoring: cardiac monitor, continuous pulse ox and blood pressure Approach: midline Location: L2-3 Injection technique: single-shot Needle Needle type: Pencan  Needle gauge: 24 G Needle length: 9 cm Additional Notes Functioning IV was confirmed and monitors were applied. Sterile prep and drape, including hand hygiene and sterile gloves were used. The patient was positioned and the spine was prepped. The skin was anesthetized with lidocaine.  Free flow of clear CSF was obtained prior to injecting local anesthetic into the CSF.  The spinal needle aspirated freely following injection.  The needle was carefully withdrawn.  The patient tolerated the procedure well.

## 2019-06-11 NOTE — Evaluation (Signed)
Physical Therapy Evaluation Patient Details Name: Briana Parks MRN: [id number] DOB: [birth year] Today's Date: 06/11/2019   History of Present Illness  Patient is 55 y.o. female s/p Lt TKA on 06/11/19 with PMH significant for vertigo, scoliosis, OA, OSA.  Clinical Impression  Briana Parks is a 54 y.o. female POD 0 s/p Lt TKA. Patient reports independence with mobility at baseline. Patient is now limited by functional impairments (see PT problem list below) and requires min assist/guard for transfers and gait with RW. Patient was able to ambulate ~50 feet with RW and required min assist initially to faciliatte Lt knee flexion. Patient instructed in exercise to facilitate circulation. Patient is wishing to discharge day of surgery and will benefit from additional Acute PT session to continue education in effort to meet pt's goal. She will benefit from continued skilled PT interventions to address impairments and progress towards PLOF. Acute PT will follow to progress mobility and stair training in preparation for safe discharge home.     Follow Up Recommendations Follow surgeon's recommendation for DC plan and follow-up therapies    Equipment Recommendations  None recommended by PT    Recommendations for Other Services       Precautions / Restrictions Precautions Precautions: Fall Restrictions Weight Bearing Restrictions: No      Mobility  Bed Mobility Overal bed mobility: Needs Assistance Bed Mobility: Supine to Sit;Sit to Supine     Supine to sit: Supervision Sit to supine: Supervision   General bed mobility comments: pt requried extra time and extra effort to raise trunk up and sit EOB. pt able to manage Lt LE mobility without assist.  Transfers Overall transfer level: Needs assistance Equipment used: Rolling walker (2 wheeled) Transfers: Sit to/from Stand Sit to Stand: Min assist;Min guard         General transfer comment: cues for hand placement and technique  with RW, light assist to complete power up from EOB  Ambulation/Gait Ambulation/Gait assistance: Min assist Gait Distance (Feet): 50 Feet Assistive device: Rolling walker (2 wheeled) Gait Pattern/deviations: Step-to pattern;Decreased stance time - left;Decreased weight shift to left Gait velocity: decreased   General Gait Details: cues for safe step pattern, manual blocking/assist required initially to prevent Lt knee buckling. pt able to use UE's to prevent buckling as she continued with gait. pt reported increased from 2 to 5/10 pain after short bout of gait.  Stairs     Wheelchair Mobility    Modified Rankin (Stroke Patients Only)       Balance Overall balance assessment: Needs assistance Sitting-balance support: Feet supported Sitting balance-Leahy Scale: Good     Standing balance support: During functional activity;Bilateral upper extremity supported Standing balance-Leahy Scale: Poor          Pertinent Vitals/Pain Pain Assessment: 0-10 Pain Score: 5  Pain Location: Lt knee Pain Descriptors / Indicators: Burning;Aching;Sore Pain Intervention(s): Limited activity within patient's tolerance;Monitored during session;Repositioned    Home Living Family/patient expects to be discharged to:: Private residence Living Arrangements: Spouse/significant other;Children Available Help at Discharge: Family Type of Home: House Home Access: Stairs to enter Entrance Stairs-Rails: (bannister on both) Secretary/administrator of Steps: 1+1 Home Layout: One level Home Equipment: Environmental consultant - 2 wheels;Cane - single point(CPM)      Prior Function Level of Independence: Independent               Hand Dominance   Dominant Hand: Right    Extremity/Trunk Assessment   Upper Extremity Assessment Upper Extremity Assessment: Overall WFL for  tasks assessed    Lower Extremity Assessment Lower Extremity Assessment: LLE deficits/detail LLE Deficits / Details: good quad  activation, no extensor lag with SLR, sliht buckling noted in weight bearing LLE Sensation: (pt reports some tingling in dorsal Lt foot) LLE Coordination: WNL    Cervical / Trunk Assessment Cervical / Trunk Assessment: Normal  Communication   Communication: No difficulties  Cognition Arousal/Alertness: Awake/alert Behavior During Therapy: WFL for tasks assessed/performed Overall Cognitive Status: Within Functional Limits for tasks assessed           General Comments      Exercises Total Joint Exercises Ankle Circles/Pumps: AROM;15 reps;Supine;Both   Assessment/Plan    PT Assessment Patient needs continued PT services  PT Problem List Decreased strength;Decreased range of motion;Decreased activity tolerance;Decreased balance;Decreased mobility;Decreased knowledge of use of DME;Pain;Decreased knowledge of precautions       PT Treatment Interventions Gait training;DME instruction;Stair training;Functional mobility training;Therapeutic activities;Therapeutic exercise;Balance training;Patient/family education    PT Goals (Current goals can be found in the Care Plan section)  Acute Rehab PT Goals Patient Stated Goal: to get home today PT Goal Formulation: With patient Time For Goal Achievement: 06/18/19 Potential to Achieve Goals: Good    Frequency 7X/week    AM-PAC PT "6 Clicks" Mobility  Outcome Measure Help needed turning from your back to your side while in a flat bed without using bedrails?: None Help needed moving from lying on your back to sitting on the side of a flat bed without using bedrails?: None Help needed moving to and from a bed to a chair (including a wheelchair)?: A Little Help needed standing up from a chair using your arms (e.g., wheelchair or bedside chair)?: A Little Help needed to walk in hospital room?: A Little Help needed climbing 3-5 steps with a railing? : A Little 6 Click Score: 20    End of Session Equipment Utilized During Treatment: Gait  belt Activity Tolerance: Patient tolerated treatment well Patient left: in bed;with call bell/phone within reach Nurse Communication: Mobility status(pain meds needed, additional PT session needed) PT Visit Diagnosis: Muscle weakness (generalized) (M62.81);Difficulty in walking, not elsewhere classified (R26.2)    Time: 7035-0093 PT Time Calculation (min) (ACUTE ONLY): 26 min   Charges:   PT Evaluation $PT Eval Low Complexity: 1 Low PT Treatments $Gait Training: 8-22 mins        Verner Mould, DPT Physical Therapist with [facility] [phone]  06/11/2019 3:20 PM

## 2019-06-11 NOTE — Anesthesia Postprocedure Evaluation (Signed)
Anesthesia Post Note  Patient: Briana Parks  Procedure(s) Performed: TOTAL KNEE ARTHROPLASTY (Left Knee)     Patient location during evaluation: PACU Anesthesia Type: Spinal Level of consciousness: awake and alert Pain management: pain level controlled Vital Signs Assessment: post-procedure vital signs reviewed and stable Respiratory status: spontaneous breathing and respiratory function stable Cardiovascular status: blood pressure returned to baseline and stable Postop Assessment: spinal receding Anesthetic complications: no    Last Vitals:  Vitals:   06/11/19 1154 06/11/19 1300  BP: 111/65 135/78  Pulse: 68 71  Resp: 18 18  Temp: 36.5 C   SpO2: 96% 96%    Last Pain:  Vitals:   06/11/19 1300  TempSrc:   PainSc: 2                  Dayden Viverette DANIEL

## 2019-06-11 NOTE — Progress Notes (Signed)
Physical Therapy Treatment Patient Details Name: Briana Parks MRN: [id number] DOB: December 02, 1965 Today's Date: 06/11/2019    History of Present Illness Patient is 54 y.o. female s/p Lt TKA on 06/11/19 with PMH significant for vertigo, scoliosis, OA, OSA.    PT Comments    Patient seen for additional Acute PT session in effort to meet goal for same da discarge POD 0 s/p Lt TKA. Patient continues to report numbness/tingling in Lt foot and knee immobilizer donned for safety to prevent Lt knee buckling during gait. Patient was able to increase ambulation distance to ~140' and performed stair mobility with cues for safe sequencing; pt and verbalized safe guarding position for people assisting with mobility. Patient is hesitant to WB on Lt LE during gait and stairs and remains slightly unsteady even with immobilizer on Lt knee. She will benefit from continues skilled Acute PT s to progress mobility and stair training in preparation for safe discharge home. Acute PT will follow.    Follow Up Recommendations  Follow surgeon's recommendation for DC plan and follow-up therapies     Equipment Recommendations  None recommended by PT    Recommendations for Other Services       Precautions / Restrictions Precautions Precautions: Fall Restrictions Weight Bearing Restrictions: No    Mobility  Bed Mobility Overal bed mobility: Needs Assistance Bed Mobility: Supine to Sit;Sit to Supine     Supine to sit: Supervision Sit to supine: Supervision   General bed mobility comments: pt requires extra time and effort.  Transfers Overall transfer level: Needs assistance Equipment used: Rolling walker (2 wheeled) Transfers: Sit to/from Stand Sit to Stand: Min guard         General transfer comment: pt with good carryover for hand placement and technique with RW. no assist required for powerup from EOB and toilet. Pt reliant on UE's to power up from bed or grab bar by toilet.    Ambulation/Gait Ambulation/Gait assistance: Min assist Gait Distance (Feet): 140 Feet Assistive device: Rolling walker (2 wheeled) Gait Pattern/deviations: Step-to pattern;Decreased stance time - left;Decreased weight shift to left Gait velocity: decreased   General Gait Details: pt maintained safe step pattern and safe proximity to RW throughout, no overt LOB no. Pt continues to report numbness in Lt foot and is hesistant to WB on Lt LE even with imobilizer in place. pt sliding Rt foot on ground to avoid WB on Lt LE.   Stairs Stairs: Yes Stairs assistance: Min guard Stair Management: No rails;Step to pattern;Backwards;With walker Number of Stairs: 2(2x1) General stair comments: cues for step sequencing "up with good, down with bad". min guard/assist to stabilize RW, pt verbalized safe guarding position for family to assist.   Wheelchair Mobility    Modified Rankin (Stroke Patients Only)       Balance Overall balance assessment: Needs assistance Sitting-balance support: Feet supported Sitting balance-Leahy Scale: Good     Standing balance support: During functional activity;Bilateral upper extremity supported Standing balance-Leahy Scale: Poor           Cognition Arousal/Alertness: Awake/alert Behavior During Therapy: WFL for tasks assessed/performed Overall Cognitive Status: Within Functional Limits for tasks assessed            General Comments        Pertinent Vitals/Pain Pain Assessment: 0-10 Pain Score: 4  Pain Location: Lt knee Pain Descriptors / Indicators: Burning;Aching;Sore Pain Intervention(s): Limited activity within patient's tolerance;Monitored during session;Repositioned    Home Living Family/patient expects to be discharged to:: Private residence  Living Arrangements: Spouse/significant other;Children Available Help at Discharge: Family Type of Home: House Home Access: Stairs to enter Entrance Stairs-Rails: (bannister on both) Home  Layout: One level Home Equipment: Environmental consultant - 2 wheels;Cane - single point(CPM)      Prior Function Level of Independence: Independent          PT Goals (current goals can now be found in the care plan section) Acute Rehab PT Goals Patient Stated Goal: to get home today PT Goal Formulation: With patient Time For Goal Achievement: 06/18/19 Potential to Achieve Goals: Good    Frequency    7X/week      PT Plan Current plan remains appropriate       AM-PAC PT "6 Clicks" Mobility   Outcome Measure  Help needed turning from your back to your side while in a flat bed without using bedrails?: None Help needed moving from lying on your back to sitting on the side of a flat bed without using bedrails?: None Help needed moving to and from a bed to a chair (including a wheelchair)?: A Little Help needed standing up from a chair using your arms (e.g., wheelchair or bedside chair)?: A Little Help needed to walk in hospital room?: A Little Help needed climbing 3-5 steps with a railing? : A Little 6 Click Score: 20    End of Session Equipment Utilized During Treatment: Gait belt;Left knee immobilizer Activity Tolerance: Patient tolerated treatment well Patient left: in bed;with call bell/phone within reach Nurse Communication: Mobility status(pain meds needed, additional PT session needed) PT Visit Diagnosis: Muscle weakness (generalized) (M62.81);Difficulty in walking, not elsewhere classified (R26.2)     Time: 5809-9833 PT Time Calculation (min) (ACUTE ONLY): 19 min  Charges:  $Gait Training: 8-22 mins                     Wynn Maudlin, DPT Physical Therapist with [facility] [phone]  06/11/2019 5:20 PM

## 2019-06-11 NOTE — Discharge Instructions (Signed)
Remove your scopalamine patch behind your left ear tomorrow. Wash your hands after removing patch.     INSTRUCTIONS AFTER JOINT REPLACEMENT   o Remove items at home which could result in a fall. This includes throw rugs or furniture in walking pathways o ICE to the affected joint every three hours while awake for 30 minutes at a time, for at least the first 3-5 days, and then as needed for pain and swelling.  Continue to use ice for pain and swelling. You may notice swelling that will progress down to the foot and ankle.  This is normal after surgery.  Elevate your leg when you are not up walking on it.   o Continue to use the breathing machine you got in the hospital (incentive spirometer) which will help keep your temperature down.  It is common for your temperature to cycle up and down following surgery, especially at night when you are not up moving around and exerting yourself.  The breathing machine keeps your lungs expanded and your temperature down.   DIET:  As you were doing prior to hospitalization, we recommend a well-balanced diet.  DRESSING / WOUND CARE / SHOWERING  Keep the surgical dressing until follow up.  The dressing is water proof, so you can shower without any extra covering.  IF THE DRESSING FALLS OFF or the wound gets wet inside, change the dressing with sterile gauze.  Please use good hand washing techniques before changing the dressing.  Do not use any lotions or creams on the incision until instructed by your surgeon.    ACTIVITY  o Increase activity slowly as tolerated, but follow the weight bearing instructions below.   o No driving for 6 weeks or until further direction given by your physician.  You cannot drive while taking narcotics.  o No lifting or carrying greater than 10 lbs. until further directed by your surgeon. o Avoid periods of inactivity such as sitting longer than an hour when not asleep. This helps prevent blood clots.  o You may return to work  once you are authorized by your doctor.     WEIGHT BEARING   Weight bearing as tolerated with assist device (walker, cane, etc) as directed, use it as long as suggested by your surgeon or therapist, typically at least 4-6 weeks.   EXERCISES  Results after joint replacement surgery are often greatly improved when you follow the exercise, range of motion and muscle strengthening exercises prescribed by your doctor. Safety measures are also important to protect the joint from further injury. Any time any of these exercises cause you to have increased pain or swelling, decrease what you are doing until you are comfortable again and then slowly increase them. If you have problems or questions, call your caregiver or physical therapist for advice.   Rehabilitation is important following a joint replacement. After just a few days of immobilization, the muscles of the leg can become weakened and shrink (atrophy).  These exercises are designed to build up the tone and strength of the thigh and leg muscles and to improve motion. Often times heat used for twenty to thirty minutes before working out will loosen up your tissues and help with improving the range of motion but do not use heat for the first two weeks following surgery (sometimes heat can increase post-operative swelling).   These exercises can be done on a training (exercise) mat, on the floor, on a table or on a bed. Use whatever works the best  and is most comfortable for you.    Use music or television while you are exercising so that the exercises are a pleasant break in your day. This will make your life better with the exercises acting as a break in your routine that you can look forward to.   Perform all exercises about fifteen times, three times per day or as directed.  You should exercise both the operative leg and the other leg as well.  Exercises include:   . Quad Sets - Tighten up the muscle on the front of the thigh (Quad) and hold  for 5-10 seconds.   . Straight Leg Raises - With your knee straight (if you were given a brace, keep it on), lift the leg to 60 degrees, hold for 3 seconds, and slowly lower the leg.  Perform this exercise against resistance later as your leg gets stronger.  . Leg Slides: Lying on your back, slowly slide your foot toward your buttocks, bending your knee up off the floor (only go as far as is comfortable). Then slowly slide your foot back down until your leg is flat on the floor again.  Briana Parks Briana Parks: Lying on your back spread your legs to the side as far apart as you can without causing discomfort.  . Hamstring Strength:  Lying on your back, push your heel against the floor with your leg straight by tightening up the muscles of your buttocks.  Repeat, but this time bend your knee to a comfortable angle, and push your heel against the floor.  You may put a pillow under the heel to make it more comfortable if necessary.   A rehabilitation program following joint replacement surgery can speed recovery and prevent re-injury in the future due to weakened muscles. Contact your doctor or a physical therapist for more information on knee rehabilitation.    CONSTIPATION  Constipation is defined medically as fewer than three stools per week and severe constipation as less than one stool per week.  Even if you have a regular bowel pattern at home, your normal regimen is likely to be disrupted due to multiple reasons following surgery.  Combination of anesthesia, postoperative narcotics, change in appetite and fluid intake all can affect your bowels.   YOU MUST use at least one of the following options; they are listed in order of increasing strength to get the job done.  They are all available over the counter, and you may need to use some, POSSIBLY even all of these options:    Drink plenty of fluids (prune juice may be helpful) and high fiber foods Colace 100 mg by mouth twice a day  Senokot for constipation  as directed and as needed Dulcolax (bisacodyl), take with full glass of water  Miralax (polyethylene glycol) once or twice a day as needed.  If you have tried all these things and are unable to have a bowel movement in the first 3-4 days after surgery call either your surgeon or your primary doctor.    If you experience loose stools or diarrhea, hold the medications until you stool forms back up.  If your symptoms do not get better within 1 week or if they get worse, check with your doctor.  If you experience "the worst abdominal pain ever" or develop nausea or vomiting, please contact the office immediately for further recommendations for treatment.   ITCHING:  If you experience itching with your medications, try taking only a single pain pill, or even half a  pain pill at a time.  You can also use Benadryl over the counter for itching or also to help with sleep.   TED HOSE STOCKINGS:  Use stockings on both legs until for at least 2 weeks or as directed by physician office. They may be removed at night for sleeping.  MEDICATIONS:  See your medication summary on the "After Visit Summary" that nursing will review with you.  You may have some home medications which will be placed on hold until you complete the course of blood thinner medication.  It is important for you to complete the blood thinner medication as prescribed.  PRECAUTIONS:  If you experience chest pain or shortness of breath - call 911 immediately for transfer to the hospital emergency department.   If you develop a fever greater that 101 F, purulent drainage from wound, increased redness or drainage from wound, foul odor from the wound/dressing, or calf pain - CONTACT YOUR SURGEON.                                                   FOLLOW-UP APPOINTMENTS:  If you do not already have a post-op appointment, please call the office for an appointment to be seen by your surgeon.  Guidelines for how soon to be seen are listed in your "After  Visit Summary", but are typically between 1-4 weeks after surgery.  OTHER INSTRUCTIONS:   Knee Replacement:  Do not place pillow under knee, focus on keeping the knee straight while resting. CPM instructions: 0-90 degrees, 2 hours in the morning, 2 hours in the afternoon, and 2 hours in the evening. Place foam block, curve side up under heel at all times except when in CPM or when walking.  DO NOT modify, tear, cut, or change the foam block in any way.   DENTAL ANTIBIOTICS:  In most cases prophylactic antibiotics for Dental procdeures after total joint surgery are not necessary.  Exceptions are as follows:  1. History of prior total joint infection  2. Severely immunocompromised (Organ Transplant, cancer chemotherapy, Rheumatoid biologic meds such as [place])  3. Poorly controlled diabetes (A1C &gt; 8.0, blood glucose over 200)  If you have one of these conditions, contact your surgeon for an antibiotic prescription, prior to your dental procedure.   MAKE SURE YOU:  . Understand these instructions.  . Get help right away if you are not doing well or get worse.    Thank you for letting us be a part of your medical care team.  It is a privilege we respect greatly.  We hope these instructions will help you stay on track for a fast and full recovery!      Spinal Anesthesia and Epidural Anesthesia, Care After This sheet gives you information about how to care for yourself after your procedure. Your doctor may also give you more specific instructions. If you have problems or questions, call your doctor. Follow these instructions at home: For at least 24 hours after the procedure:   Have a responsible adult stay with you. It is important to have someone help care for you until you are awake and alert.  Rest as needed.  Do not do activities where you could fall or get hurt (injured).  Do not drive.  Do not use heavy machinery.  Do not drink alcohol.  Do not take sleeping  pills or medicines that make you sleepy (drowsy).  Do not make important decisions.  Do not sign legal documents.  Do not take care of children on your own. Eating and drinking  If you throw up (vomit), drink water, juice, or soup when nausea and vomiting stop.  Drink enough fluid to keep your pee (urine) pale yellow.  Make sure you do not feel like throwing up (nauseous) before you eat solid foods.  Follow the diet that your doctor recommends. General instructions  Return to your normal activities as told by your doctor. Ask your doctor what activities are safe for you.  Take over-the-counter and prescription medicines only as told by your doctor.  If you have sleep apnea, surgery and certain medicines can raise your risk for breathing problems. Follow instructions from your doctor about when to wear your sleep device. Your doctor may tell you to wear your sleep device: ? Anytime you are sleeping, including during daytime naps. ? While taking prescription pain medicines, sleeping pills, or medicines that make you sleepy.  Do not use any products that contain nicotine or tobacco. This includes cigarettes and e-cigarettes. ? If you need help quitting, ask your doctor. ? If you smoke, do not smoke by yourself. Make sure someone is nearby in case you need help.  Keep all follow-up visits as told by your doctor. This is important. Contact a doctor if:  It has been more than one day since your procedure and you feel like throwing up.  It has been more than one day since your procedure and you throw up.  You have a rash. Get help right away if:  You have a fever.  You have a headache that lasts a long time.  You have a very bad headache.  Your vision is blurry.  You see two of a single object (double vision).  You are dizzy or light-headed.  You faint.  Your arms or legs tingle, feel weak, or get numb.  You have trouble breathing.  You cannot pee  (urinate). Summary  After the procedure, have a responsible adult stay with you at home until you are fully awake and alert.  Do not do activities that might get you injured. Do not drive, use heavy machinery, drink alcohol, or make important decisions for 24 hours after the procedure.  Take medicines as told by your doctor. Do not use products that contain nicotine or tobacco.  Get help right away if you have a fever, blurry vision, difficulty breathing or passing urine, or weakness or numbness in arms or legs. This information is not intended to replace advice given to you by your health care provider. Make sure you discuss any questions you have with your health care provider. Document Revised: 02/21/2017 Document Reviewed: 07/03/2015 Elsevier Patient Education  2020 [employer].

## 2019-06-11 NOTE — Op Note (Signed)
3-0 SQ vicryl. An Aquacil and Ace wrap were applied. The patient was taken to recovery room without difficulty.   Harvie Junior 06/11/2019, 10:33 AM  PATIENT ID:      Briana Parks  MRN:     [id number] DOB/AGE:    1965-05-07 / 54 y.o.  PATIENT ID:      Briana Parks  MRN:     [id number] DOB/AGE:    1965-10-04 / 54 y.o.       OPERATIVE REPORT   DATE OF PROCEDURE:  06/11/2019      PREOPERATIVE DIAGNOSIS:   OSTEOARTHRITIS LEFT KNEE      Estimated body mass index is 35.74 kg/m as calculated from the following:   Height as of 06/08/19: 5\' 6"  (1.676 m).   Weight as of 06/08/19: 100.4 kg.                                                       POSTOPERATIVE DIAGNOSIS:   OSTEOARTHRITIS LEFT KNEE                                                                       PROCEDURE:  Procedure(s): TOTAL KNEE ARTHROPLASTY Using DepuyAttune RP implants #4 Femur, #4Tibia, 5 mm Attune RP bearing, 35 Patella    SURGEON: Marabelle Cushman 06/10/19  ASSISTANT:   Jim bethunePA-C   (Present and scrubbed throughout the case, critical for assistance with exposure, retraction, instrumentation, and closure.)        ANESTHESIA: spinal, 20cc Exparel, 50cc 0.25% Marcaine EBL: min cc FLUID REPLACEMENT: unk cc crystaloid TOURNIQUET: DRAINS: None TRANEXAMIC ACID: 1gm IV, 2gm topical COMPLICATIONS:  None         INDICATIONS FOR PROCEDURE: The patient has  OSTEOARTHRITIS LEFT KNEE, mild varus deformities, XR shows bone on bone arthritis, lateral subluxation of tibia. Patient has failed all conservative measures including anti-inflammatory medicines, narcotics, attempts at exercise and weight loss, cortisone injections and viscosupplementation.  Risks and benefits of surgery have been discussed, questions answered.   DESCRIPTION OF PROCEDURE: The patient identified by armband, received  IV antibiotics, in the holding area at [facility]. Patient taken to the operating room, appropriate anesthetic monitors were attached, and spinal anesthesia was  induced. IV Tranexamic acid was given.Tourniquet applied high to the operative thigh. Lateral post and foot positioner applied to the table, the lower extremity was then prepped and draped in usual sterile fashion  from the toes to the tourniquet. Time-out procedure was performed. Jim bethune PAC, was present and scrubbed throughout the case, critical for assistance with, positioning, exposure, retraction, instrumentation, and closure.The skin and subcutaneous tissue along the incision was injected with 20 cc of a mixture of Exparel and Marcaine solution, using a 20-gauge by 1-1/2 inch needle. We began the operation, with the knee flexed 130 degrees, by making the anterior midline incision starting at handbreadth above the patella going over the patella 1 cm medial to and 4 cm distal to the tibial tubercle. Small bleeders in the skin and the subcutaneous tissue identified and cauterized. Transverse retinaculum was incised and reflected medially and a medial parapatellar arthrotomy was accomplished. the patella was everted and theprepatellar fat pad resected. The superficial medial collateral ligament was then elevated from anterior to posterior along the proximal flare of the tibia and anterior half of the menisci resected. The knee was hyperflexed exposing bone on bone  3-0 SQ vicryl. An Aquacil and Ace wrap were applied. The patient was taken to recovery room without difficulty.   Harvie Junior 06/11/2019, 10:33 AM  PATIENT ID:      Briana Parks  MRN:     [id number] DOB/AGE:    1965-05-07 / 54 y.o.

## 2019-06-11 NOTE — Anesthesia Procedure Notes (Signed)
Anesthesia Regional Block: Adductor canal block   Pre-Anesthetic Checklist: ,, timeout performed, Correct Patient, Correct Site, Correct Laterality, Correct Procedure, Correct Position, site marked, Risks and benefits discussed,  Surgical consent,  Pre-op evaluation,  At surgeon's request and post-op pain management  Laterality: Left  Prep: chloraprep       Needles:  Injection technique: Single-shot  Needle Type: Stimulator Needle - 80     Needle Length: 10cm  Needle Gauge: 21     Additional Needles:   Narrative:  Start time: 06/11/2019 8:04 AM End time: 06/11/2019 8:14 AM Injection made incrementally with aspirations every 5 mL.  Performed by: Personally

## 2019-06-11 NOTE — Progress Notes (Signed)
Reported off to [employer] in phase II PACU.

## 2019-06-12 DIAGNOSIS — M1712 Unilateral primary osteoarthritis, left knee: Secondary | ICD-10-CM | POA: Diagnosis not present

## 2019-06-12 NOTE — Discharge Summary (Signed)
Patient ID: Briana Parks MRN: [id number] DOB/AGE: November 16, 1965 54 y.o.  Admit date: 06/11/2019 Discharge date: 06/12/2019  Admission Diagnoses:  Principal Problem:   Primary osteoarthritis of left knee Active Problems:   Status post left knee surgery   Discharge Diagnoses:  Same  Past Medical History:  Diagnosis Date  . ALLERGIC RHINITIS CAUSE UNSPECIFIED 08/03/2008   Qualifier: Diagnosis of  By: Thomos Lemons    . Arachnoid cyst   . Deviated septum    large turbinates  . Gestational diabetes   . Headache 09/30/2013  . 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 Kidney infection   . Low blood pressure   . Obstructive sleep apnea 7cm water pressure CPAP 10/24/2010   Moderate. Sleep study was performed at [facility] neurological clinic by Dr. Marliss Coots on 10/02/2010. AHI was 19.8 with desaturations to 79%. Frequent severe snoring. Moderate wedge works associated with respiratory events. Difficulty initiating and maintaining sleep with no slow wave sleep. She had severe OSA and REM.   . Osteoarthritis of left knee 10/26/2013  . Plantar fasciitis   . Pneumonia    history of  . PONV (postoperative nausea and vomiting)    hard to wake up after anesthesia, did not get PONV after 2017 surgery at [facility]. notes care everywhere  . Recurrent UTI   . Rosacea 09/13/2010  . Scoliosis   . Tinnitus   . Vertigo     Surgeries: Procedure(s): TOTAL KNEE ARTHROPLASTY on 06/11/2019   Consultants:   Discharged Condition: Improved  Hospital Course: TEIARA BARIA is an 54 y.o. female who was admitted 06/11/2019 for operative treatment ofPrimary osteoarthritis of left knee. Patient has severe unremitting pain that affects sleep, daily activities, and work/hobbies. After pre-op clearance the patient was taken to the operating room on 06/11/2019 and underwent  Procedure(s): TOTAL KNEE ARTHROPLASTY.    Patient is doing well on postoperative day 1.  She states the block is worn off.  She is  hoping to be discharged today.  Awake and alert, respirations are even and unlabored.  Knee dressing in place.  Ice about the knee.  Some tenderness palpation about the knee.  She endorses sensation distally about the leg.  Able to dorsiflex and plantarflex the ankle.  Patient was given perioperative antibiotics:  Anti-infectives (From admission, onward)   Start     Dose/Rate Route Frequency Ordered Stop   06/11/19 1300  ceFAZolin (ANCEF) IVPB 2g/100 mL premix     2 g 200 mL/hr over 30 Minutes Intravenous  Once 06/11/19 1055 06/11/19 1304   06/11/19 0630  ceFAZolin (ANCEF) IVPB 2g/100 mL premix     2 g 200 mL/hr over 30 Minutes Intravenous On call to O.R. 06/11/19 1638 06/11/19 0845       Patient was given sequential compression devices, early ambulation, and chemoprophylaxis to prevent DVT.  Patient benefited maximally from hospital stay and there were no complications.    Recent vital signs:  Patient Vitals for the past 24 hrs:  BP Temp Temp src Pulse Resp SpO2 Height Weight  06/12/19 0520 123/71 98.2 F (36.8 C) Oral 67 18 97 % -- --  06/12/19 0130 119/70 98.9 F (37.2 C) Axillary 73 16 98 % -- --  06/11/19 2205 114/70 98 F (36.7 C) Oral 76 16 94 % -- --  06/11/19 1952 -- -- -- -- -- -- 5\' 6"  (1.676 m) 98.9 kg  06/11/19 1901 122/78 98.7 F (37.1 C) Oral 95 20  95 % -- --  06/11/19 1800 124/74 -- -- 90 -- 96 % -- --  06/11/19 1400 132/83 -- -- 61 16 96 % -- --  06/11/19 1300 135/78 -- -- 71 18 96 % -- --  06/11/19 1154 111/65 97.7 F (36.5 C) Oral 68 18 96 % -- --  06/11/19 1130 127/74 -- -- 65 11 98 % -- --  06/11/19 1115 122/70 -- -- 81 13 97 % -- --  06/11/19 1100 116/75 -- -- 63 12 99 % -- --  06/11/19 1045 119/70 -- -- 75 12 98 % -- --  06/11/19 1035 101/69 98 F (36.7 C) -- 97 13 99 % -- --  06/11/19 0819 (!) 146/95 -- -- 60 12 96 % -- --  06/11/19 0817 -- -- -- 66 11 94 % -- --  06/11/19 0816 -- -- -- 60 12 98 % -- --  06/11/19 0815 -- -- -- 61 12 97 % -- --   06/11/19 0814 -- -- -- 71 11 98 % -- --  06/11/19 0813 -- -- -- 68 13 96 % -- --     Recent laboratory studies: No results for input(s): WBC, HGB, HCT, PLT, NA, K, CL, CO2, BUN, CREATININE, GLUCOSE, INR, CALCIUM in the last 72 hours.  Invalid input(s): PT, 2   Discharge Medications:   Allergies as of 06/12/2019      Reactions   Eggs Or Egg-derived Products    Sulfamethoxazole-trimethoprim Hives   Tdap [tetanus-diphth-acell Pertussis] Other (See Comments)   Skin reaction with lymph node sweeling      Medication List    STOP taking these medications   acetaminophen 650 MG CR tablet Commonly known as: TYLENOL   ibuprofen 200 MG tablet Commonly known as: ADVIL   meloxicam 15 MG tablet Commonly known as: MOBIC     TAKE these medications   AMBULATORY NON FORMULARY MEDICATION Medication Name: 7 cm water pressure CPAP, heated/humidified machine.   Dx : Severe OSA.   aspirin EC 325 MG tablet Take 1 tablet (325 mg total) by mouth 2 (two) times daily after a meal. Take x 1 month post op to decrease risk of blood clots.   docusate sodium 100 MG capsule Commonly known as: Colace Take 1 capsule (100 mg total) by mouth 2 (two) times daily.   loratadine 10 MG tablet Commonly known as: CLARITIN Take 10 mg by mouth at bedtime.   multivitamin with minerals Tabs tablet Take 1 tablet by mouth daily.   NIGHTTIME PAIN PLUS SLEEP PO Take 15 mLs by mouth at bedtime as needed (pain/sleep.). ZzzQuil Night Pain   Nortrel 7/7/7 0.5/0.75/1-35 MG-MCG tablet Generic drug: norethindrone-ethinyl estradiol TAKE 1 TABLET EVERY DAY   nystatin powder Commonly known as: MYCOSTATIN/NYSTOP Apply topically 4 (four) times daily. What changed:   how much to take  when to take this  reasons to take this   nystatin cream Commonly known as: MYCOSTATIN Apply 1 application topically 2 (two) times daily. What changed:   when to take this  reasons to take this   oxyCODONE-acetaminophen  5-325 MG tablet Commonly known as: PERCOCET/ROXICET Take 1-2 tablets by mouth every 6 (six) hours as needed for severe pain. Notes to patient: You had 5 mg at 1139 AM this morning.   propranolol 20 MG tablet Commonly known as: INDERAL Take 1 tablet (20 mg total) by mouth 2 (two) times daily.   tiZANidine 4 MG tablet Commonly known as: Zanaflex Take 1 tablet (4 mg  total) by mouth every 8 (eight) hours as needed for muscle spasms.            Durable Medical Equipment  (From admission, onward)         Start     Ordered   06/11/19 1148  DME Walker rolling  Once    Question:  Patient needs a walker to treat with the following condition  Answer:  Primary osteoarthritis of left knee   06/11/19 1147   06/11/19 1148  DME 3 n 1  Once     06/11/19 1147           Discharge Care Instructions  (From admission, onward)         Start     Ordered   06/11/19 0000  Weight bearing as tolerated    Question Answer Comment  Laterality left   Extremity Lower      06/11/19 1257          Diagnostic Studies: DG Chest 2 View  Result Date: 06/08/2019 CLINICAL DATA:  Preop for knee replacement. EXAM: CHEST - 2 VIEW COMPARISON:  None. FINDINGS: The heart size and mediastinal contours are within normal limits. Both lungs are clear. The visualized skeletal structures are unremarkable. IMPRESSION: No active cardiopulmonary disease. Electronically Signed   By: Lupita Raider M.D.   On: 06/08/2019 15:24    Disposition: Discharge disposition: 01-Home or Self Care       Discharge Instructions    CPM   Complete by: As directed    Continuous passive motion machine (CPM):      Use the CPM from 0 degrees  to 70 degress for 6-8 hours per day.      You may increase by 10 degrees per day.  You may break it up into 2 or 3 sessions per day.      Use CPM for 1-2 weeks or until you are told to stop.   Call MD / Call 911   Complete by: As directed    If you experience chest pain or shortness  of breath, CALL 911 and be transported to the hospital emergency room.  If you develope a fever above 101 F, pus (white drainage) or increased drainage or redness at the wound, or calf pain, call your surgeon's office.   Call MD / Call 911   Complete by: As directed    If you experience chest pain or shortness of breath, CALL 911 and be transported to the hospital emergency room.  If you develope a fever above 101 F, pus (white drainage) or increased drainage or redness at the wound, or calf pain, call your surgeon's office.   Constipation Prevention   Complete by: As directed    Drink plenty of fluids.  Prune juice may be helpful.  You may use a stool softener, such as Colace (over the counter) 100 mg twice a day.  Use MiraLax (over the counter) for constipation as needed.   Diet - low sodium heart healthy   Complete by: As directed    Diet general   Complete by: As directed    Do not put a pillow under the knee. Place it under the heel.   Complete by: As directed    Increase activity slowly as tolerated   Complete by: As directed    Increase activity slowly as tolerated   Complete by: As directed    TED hose   Complete by: As directed    Use stockings (TED  hose) for 2 weeks on both leg(s).  You may remove them at night for sleeping.   Weight bearing as tolerated   Complete by: As directed    Laterality: left   Extremity: Lower      Follow-up Information    Jodi Geralds, MD. Schedule an appointment as soon as possible for a visit in 2 weeks.   Specialty: Orthopedic Surgery Contact information: [address] [place] [address] [phone]            Signed: Terance Hart 06/12/2019, 8:12 AM

## 2019-06-12 NOTE — Progress Notes (Signed)
Physical Therapy Treatment Patient Details Name: Briana Parks MRN: [id number] DOB: May 22, 1965 Today's Date: 06/12/2019    History of Present Illness Patient is 54 y.o. female s/p Lt TKA on 06/11/19 with PMH significant for vertigo, scoliosis, OA, OSA.    PT Comments    Pt able to perform SLR and ambulated in hallway without KI today.  Pt also practiced step again.  Pt performed exercises in bed this morning so performed only one of each to ensure correct technique and then pt performed seated exercises.  Pt has HEP handout and had no further questions.  Pt ready for d/c home today.    Follow Up Recommendations  Follow surgeon's recommendation for DC plan and follow-up therapies     Equipment Recommendations  None recommended by PT    Recommendations for Other Services       Precautions / Restrictions Precautions Precautions: Fall;Knee Restrictions Weight Bearing Restrictions: No    Mobility  Bed Mobility Overal bed mobility: Needs Assistance Bed Mobility: Supine to Sit     Supine to sit: Supervision        Transfers Overall transfer level: Needs assistance Equipment used: Rolling walker (2 wheeled) Transfers: Sit to/from Stand Sit to Stand: Min guard;Supervision         General transfer comment: able to recall safe technique  Ambulation/Gait Ambulation/Gait assistance: Min guard;Supervision Gait Distance (Feet): 120 Feet Assistive device: Rolling walker (2 wheeled) Gait Pattern/deviations: Step-to pattern;Decreased stance time - left;Decreased weight shift to left;Antalgic Gait velocity: decreased   General Gait Details: pt ambulating well with RW, step to pattern for pain control   Stairs Stairs: Yes Stairs assistance: Min guard Stair Management: Step to pattern;Backwards;With walker Number of Stairs: 1 General stair comments: pt able to recall technique from yesterday; performed twice   Wheelchair Mobility    Modified Rankin (Stroke  Patients Only)       Balance                                            Cognition Arousal/Alertness: Awake/alert Behavior During Therapy: WFL for tasks assessed/performed Overall Cognitive Status: Within Functional Limits for tasks assessed                                        Exercises Total Joint Exercises Ankle Circles/Pumps: AROM;Supine;Both;10 reps Heel Slides: AAROM;Supine;10 reps;Left Long Arc Quad: AROM;Left;10 reps;Seated Knee Flexion: AAROM;Left;10 reps;Seated    General Comments        Pertinent Vitals/Pain Pain Assessment: 0-10 Pain Score: 3  Pain Location: Lt knee Pain Descriptors / Indicators: Aching;Sore Pain Intervention(s): Repositioned;Monitored during session    Home Living                      Prior Function            PT Goals (current goals can now be found in the care plan section) Progress towards PT goals: Progressing toward goals    Frequency    7X/week      PT Plan Current plan remains appropriate    Co-evaluation              AM-PAC PT "6 Clicks" Mobility   Outcome Measure  Help needed turning from your back to your side while in  a flat bed without using bedrails?: None Help needed moving from lying on your back to sitting on the side of a flat bed without using bedrails?: None Help needed moving to and from a bed to a chair (including a wheelchair)?: A Little Help needed standing up from a chair using your arms (e.g., wheelchair or bedside chair)?: A Little Help needed to walk in hospital room?: A Little Help needed climbing 3-5 steps with a railing? : A Little 6 Click Score: 20    End of Session Equipment Utilized During Treatment: Gait belt Activity Tolerance: Patient tolerated treatment well Patient left: in bed;with call bell/phone within reach;with family/visitor present Nurse Communication: Mobility status PT Visit Diagnosis: Muscle weakness (generalized)  (M62.81);Difficulty in walking, not elsewhere classified (R26.2)     Time: 2355-7322 PT Time Calculation (min) (ACUTE ONLY): 19 min  Charges:  $Gait Training: 8-22 mins                     Paulino Door, DPT Acute Rehabilitation Services Office: [phone]  Maida Sale E 06/12/2019, 1:33 PM

## 2019-06-12 NOTE — Plan of Care (Signed)
Pt to d/c home once home health is arranged.

## 2019-06-12 NOTE — TOC Progression Note (Signed)
Transition of Care [facility]) - Progression Note    Patient Details  Name: Briana Parks MRN: [id number] Date of Birth: [birth year]  Transition of Care [facility] CM/SW Contact  Armanda Heritage, RN Phone Number: 06/12/2019, 1:34 PM  Clinical Narrative:    Patient set up with [facility] for HHPT. Patient reports she has rolling walker and 3in1.    Expected Discharge Plan: Home w Home Health Services Barriers to Discharge: No Barriers Identified  Expected Discharge Plan and Services Expected Discharge Plan: Home w Home Health Services   Discharge Planning Services: CM Consult Post Acute Care Choice: Home Health Living arrangements for the past 2 months: Single Family Home Expected Discharge Date: 06/12/19               DME Arranged: N/A DME Agency: NA       HH Arranged: PT HH Agency: [facility] Date [facility] Agency Contacted: 06/12/19 Time HH Agency Contacted: [phone] Representative spoke with at [facility] Agency: Denyse Amass   Social Determinants of Health (SDOH) Interventions    Readmission Risk Interventions No flowsheet data found.

## 2019-06-12 NOTE — Progress Notes (Signed)
Pt stable with no needs at time of d/c. No questions on d/c instructions and education given. Pt did not need dme at d/c. Pt ace wrap normal with no drainage to note.

## 2019-06-14 ENCOUNTER — Encounter: Payer: Self-pay | Admitting: Family Medicine

## 2019-06-15 ENCOUNTER — Encounter: Payer: Self-pay | Admitting: *Deleted

## 2019-07-05 ENCOUNTER — Encounter: Payer: Self-pay | Admitting: Rehabilitative and Restorative Service Providers"

## 2019-07-05 ENCOUNTER — Ambulatory Visit (INDEPENDENT_AMBULATORY_CARE_PROVIDER_SITE_OTHER): Payer: BC Managed Care – PPO | Admitting: Rehabilitative and Restorative Service Providers"

## 2019-07-05 ENCOUNTER — Other Ambulatory Visit: Payer: Self-pay

## 2019-07-05 DIAGNOSIS — M6281 Muscle weakness (generalized): Secondary | ICD-10-CM | POA: Diagnosis not present

## 2019-07-05 DIAGNOSIS — R2689 Other abnormalities of gait and mobility: Secondary | ICD-10-CM

## 2019-07-05 DIAGNOSIS — R600 Localized edema: Secondary | ICD-10-CM | POA: Diagnosis not present

## 2019-07-05 DIAGNOSIS — M25562 Pain in left knee: Secondary | ICD-10-CM

## 2019-07-05 NOTE — Patient Instructions (Signed)
Access Code: [email or web address] URL: [email or web address] Date: 07/05/2019 Prepared by: Margretta Ditty  Exercises Supine Short Arc Quad - 2 x daily - 7 x weekly - 10 reps - 1 sets Long Sitting Quad Set with Towel Roll Under Heel - 2 x daily - 7 x weekly - 1 sets - 10 reps Sidelying Hip Abduction - 2 x daily - 7 x weekly - 10 reps - 1 sets Seated Knee Flexion Slide - 2 x daily - 7 x weekly - 10 reps - 1 sets Gastroc Stretch on Wall - 2 x daily - 7 x weekly - 1 sets - 2 reps - 30 seconds hold

## 2019-07-05 NOTE — Therapy (Signed)
[facility] [place] [address] [place], [place], [address] Phone: [phone]   Fax:  [phone]  Physical Therapy Evaluation  Patient Details  Name: Briana Parks MRN: [id number] Date of Birth: 06-11-1965 Referring Provider (PT): Jodi Geralds, MD   Encounter Date: 07/05/2019  PT End of Session - 07/05/19 1601    Visit Number  1    Number of Visits  12    Date for PT Re-Evaluation  08/16/19    PT Start Time  1517    PT Stop Time  1604    PT Time Calculation (min)  47 min    Activity Tolerance  Patient limited by pain    Behavior During Therapy  [facility] for tasks assessed/performed       Past Medical History:  Diagnosis Date  . ALLERGIC RHINITIS CAUSE UNSPECIFIED 08/03/2008   Qualifier: Diagnosis of  By: Thomos Lemons    . Arachnoid cyst   . Deviated septum    large turbinates  . Gestational diabetes   . Headache 09/30/2013  . 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 Kidney infection   . Low blood pressure   . Obstructive sleep apnea 7cm water pressure CPAP 10/24/2010   Moderate. Sleep study was performed at [facility] neurological clinic by Dr. Marliss Coots on 10/02/2010. AHI was 19.8 with desaturations to 79%. Frequent severe snoring. Moderate wedge works associated with respiratory events. Difficulty initiating and maintaining sleep with no slow wave sleep. She had severe OSA and REM.   . Osteoarthritis of left knee 10/26/2013  . Plantar fasciitis   . Pneumonia    history of  . PONV (postoperative nausea and vomiting)    hard to wake up after anesthesia, did not get PONV after 2017 surgery at [facility]. notes care everywhere  . Recurrent UTI   . Rosacea 09/13/2010  . Scoliosis   . Tinnitus   . Vertigo     Past Surgical History:  Procedure Laterality Date  . bone spur removal Right 02/17/2012   foot  . bone spur removal Left 08/2015  . HERNIA REPAIR Bilateral   . KNEE ARTHROSCOPY Left 03/16/14   [facility]   . TONSILLECTOMY AND ADENOIDECTOMY    . TOTAL KNEE ARTHROPLASTY Left 06/11/2019   Procedure: TOTAL KNEE ARTHROPLASTY;  Surgeon: Jodi Geralds, MD;  Location: [facility];  Service: Orthopedics;  Laterality: Left;  . TUBAL LIGATION      There were no vitals filed for this visit.   Subjective Assessment - 07/05/19 1519    Subjective  The patient is s/p LTKA on 06/11/19.  She had home health PT until Friday 07/02/19. She is having a harder time managing her pain this week.  She feels she has lost some AROM into flexion due to pain.    Patient Stated Goals  Walking without a device without pain.    Currently in Pain?  Yes    Pain Score  6    reports she took hydrocodone 1.5 hours prior to session   Pain Location  Knee    Pain Orientation  Left    Pain Descriptors / Indicators  Sore;Aching;Discomfort    Pain Type  Surgical pain    Pain Onset  1 to 4 weeks ago    Pain Frequency  Constant    Aggravating Factors   movement    Pain Relieving Factors  rest         [facility] PT  Assessment - 07/05/19 1526      Assessment   Medical Diagnosis  L total knee arthroplasty    Referring Provider (PT)  Jodi Geralds, MD    Onset Date/Surgical Date  06/11/19    Prior Therapy  known to our clinic from prior therapy      Precautions   Precautions  Fall      Restrictions   Weight Bearing Restrictions  Yes    LLE Weight Bearing  Weight bearing as tolerated      Balance Screen   Has the patient fallen in the past 6 months  No    Has the patient had a decrease in activity level because of a fear of falling?   No   due to surgery   Is the patient reluctant to leave their home because of a fear of falling?   No      Home Environment   Living Environment  Private residence    Living Arrangements  Spouse/significant other    Type of Home  House    Home Access  Stairs to enter    Entrance Stairs-Number of Steps  2    Home Layout  One level    Home Equipment  [place] - single  point;Walker - 2 wheels   with tripod tip     Prior Function   Level of Independence  Independent      Observation/Other Assessments   Focus on Therapeutic Outcomes (FOTO)   70% limited      Observation/Other Assessments-Edema    Edema  Circumferential      Circumferential Edema   Circumferential - Right  35cm    Circumferential - Left   40cm      Sensation   Light Touch  --   some numbness lateral to incision     ROM / Strength   AROM / PROM / Strength  AROM;Strength;PROM      AROM   Overall AROM   Deficits    Overall AROM Comments  *patient notes without oxycodone in her system she could tolerate significantly greater ROM.  She was unable to tolerate the CPM machine at home noting she did it intermittently as she could tolerate.    AROM Assessment Site  Knee    Right/Left Knee  Left    Left Knee Extension  -16    Left Knee Flexion  48      PROM   Overall PROM   Deficits    Overall PROM Comments  patient reports pain worse today and she does not tolerate PROM well    PROM Assessment Site  Knee    Right/Left Knee  Left    Left Knee Extension  -12    Left Knee Flexion  62      Strength   Overall Strength  Deficits    Overall Strength Comments  Unable to lift through full available ROM today  *pain limiting all activities    Strength Assessment Site  Knee      Transfers   Transfers  Sit to Stand    Five time sit to stand comments   28 seconds 5 time sit<>stand    Comments  Places left leg 8" in front of R LE to transition sit>stand. PT encouraged maintaining some L knee flexion to engage L LE in functional mobility.      Ambulation/Gait   Ambulation/Gait  Yes    Ambulation/Gait Assistance  6: Modified independent (Device/Increase time)    Ambulation  Distance (Feet)  100 Feet    Assistive device  Straight cane    Gait Pattern  Decreased stance time - left;Decreased step length - left;Decreased weight shift to left;Left foot flat;Antalgic;Wide base of  support;Abducted - left    Ambulation Surface  Level    Gait velocity  0.96 ft/sec    Stairs  Yes    Stairs Assistance  5: Supervision    Stair Management Technique  One rail Left;Step to pattern   cane in R UE   Number of Stairs  4    Gait Comments  *PT recommended she use the RW in her home to encourage greater weight shift to the left at this time.  Pain level is high today with significant reduction in L weight shifting.                Objective measurements completed on examination: See above findings.      [facility] Adult PT Treatment/Exercise - 07/05/19 1533      Self-Care   Self-Care  Other Self-Care Comments    Other Self-Care Comments   current HEP from home health 2x/day:  toe/heel raises, hip extension, hip abduction, squats, marching, knee flexion, sitting knee flexion, ankle pumps in bed, and hip abduction supine, SAQ and LAQ      Exercises   Exercises  Knee/Hip      Knee/Hip Exercises: Stretches   Gastroc Stretch  1 rep;Left;30 seconds      Knee/Hip Exercises: Seated   Heel Slides  AROM;Left;10 reps      Knee/Hip Exercises: Supine   Quad Sets  Strengthening;Left;5 reps    Quad Sets Limitations  with a pillow under heel    Short Arc Quad Sets  Strengthening;Left;10 reps    Short Arc Quad Sets Limitations  with cues for quad contraction      Knee/Hip Exercises: Sidelying   Hip ABduction  Strengthening;Left;10 reps    Hip ABduction Limitations  cues on technique      Modalities   Modalities  Vasopneumatic      Vasopneumatic   Number Minutes Vasopneumatic   10 minutes    Vasopnuematic Location   Knee    Vasopneumatic Pressure  Low    Vasopneumatic Temperature   34             PT Education - 07/05/19 1556    Education Details  Discussed walker for encouraging gait symmetry.  With the cane, she is safe, however it encourages her to weight shift to the right side.  *She thinks this is b/c she is struggling with pain mgmt at this time.  HEP  initiated for outpatient.    Person(s) Educated  Patient    Methods  Explanation;Demonstration;Handout    Comprehension  Verbalized understanding;Returned demonstration          PT Long Term Goals - 07/05/19 1902      PT LONG TERM GOAL #1   Title  The patient will be indep with HEP.    Time  6    Period  Weeks    Target Date  08/16/19      PT LONG TERM GOAL #2   Title  The patient will report reduced functional limitation per FOTO from 70% to < or equal to 38%.    Time  6    Period  Weeks    Target Date  08/16/19      PT LONG TERM GOAL #3   Title  The patient will improve  AROM R knee to 3-110 degrees.    Time  6    Period  Weeks    Target Date  08/16/19      PT LONG TERM GOAL #4   Title  The patient will improve gait speed to > or equal to 2.6 ft/sec for return to full community ambulation without a device.    Baseline  6    Time  6    Period  Weeks    Target Date  08/16/19      PT LONG TERM GOAL #5   Title  The patient will negotiate stairs in reciprocal pattern mod indep with one handrail.    Time  6    Period  Weeks    Target Date  08/16/19             Plan - 07/05/19 1905    Clinical Impression Statement  The patient is a 54 yo female s/p L TKA.  She presents today with worsening pain due to medication change (she has called physician's office) which limited her performance during our evaluation.  She presents with imapired AROM R knee, impaired muscle strength, edema, soft tissue restrictions, poor motor activation quad, decreased flexibility.  Functional limitations include decreased ambulation for home/community, inability to work at this time, decreased ability to perform IADLs.  PT to address deficits to return to prior functional status.    Personal Factors and Comorbidities  Comorbidity 1    Comorbidities  h/o prior L knee surgery    Examination-Activity Limitations  Squat;Stairs;Stand;Locomotion Level;Sleep    Examination-Participation Restrictions   Community Activity;Yard Work    Stability/Clinical Decision Making  Stable/Uncomplicated    Clinical Decision Making  Low    Rehab Potential  Good    PT Frequency  2x / week    PT Duration  6 weeks    PT Treatment/Interventions  ADLs/Self Care Home Management;Cryotherapy;Iontophoresis 4mg /ml Dexamethasone;Moist Heat;Neuromuscular re-education;Therapeutic exercise;Therapeutic activities;Gait training;Stair training;Functional mobility training;Patient/family education;Taping;Vasopneumatic Device;Passive range of motion;Manual techniques    PT Next Visit Plan  focus on gaining ROM, muscle inhibition, flexibility, LE strengthening (progressing HEP to include heel/toe raises, HS stretching), work on gait mechanics and increasing tolerance to L weight shifting.    PT Home Exercise Plan  Access Code: [email or web address]    Consulted and Agree with Plan of Care  Patient       Patient will benefit from skilled therapeutic intervention in order to improve the following deficits and impairments:  Abnormal gait, Increased fascial restricitons, Decreased range of motion, Decreased strength, Decreased activity tolerance, Pain, Hypomobility, Impaired flexibility, Increased edema  Visit Diagnosis: Acute pain of left knee  Muscle weakness (generalized)  Other abnormalities of gait and mobility  Localized edema     Problem List Patient Active Problem List   Diagnosis Date Noted  . Status post left knee surgery 06/11/2019  . Primary osteoarthritis of first carpometacarpal joint of left hand 01/26/2019  . Abnormal MRI of head 10/03/2015  . Primary osteoarthritis of left knee 10/26/2013  . Headache 09/30/2013  . Obstructive sleep apnea 7cm water pressure CPAP 10/24/2010  . Rosacea 09/13/2010  . ALLERGIC RHINITIS CAUSE UNSPECIFIED 08/03/2008  . SPONDYLOLISTHESIS, ACQUIRED 05/21/2006  . SCOLIOSIS 12/31/2005  . SHOULDER/ARM SPRAIN/STRAIN, UNSPEC. 12/31/2005    Remas Sobel, PT 07/05/2019, 7:12  PM  [facility] [address] [place], [place], [address] Phone: [phone]   Fax:  [phone]  Name: Briana Parks MRN: [id number] Date of Birth: 1966/01/06

## 2019-07-07 ENCOUNTER — Other Ambulatory Visit: Payer: Self-pay

## 2019-07-07 ENCOUNTER — Ambulatory Visit (INDEPENDENT_AMBULATORY_CARE_PROVIDER_SITE_OTHER): Payer: BC Managed Care – PPO | Admitting: Rehabilitative and Restorative Service Providers"

## 2019-07-07 ENCOUNTER — Encounter: Payer: Self-pay | Admitting: Rehabilitative and Restorative Service Providers"

## 2019-07-07 DIAGNOSIS — R600 Localized edema: Secondary | ICD-10-CM | POA: Diagnosis not present

## 2019-07-07 DIAGNOSIS — R2689 Other abnormalities of gait and mobility: Secondary | ICD-10-CM | POA: Diagnosis not present

## 2019-07-07 DIAGNOSIS — M25562 Pain in left knee: Secondary | ICD-10-CM | POA: Diagnosis not present

## 2019-07-07 DIAGNOSIS — M6281 Muscle weakness (generalized): Secondary | ICD-10-CM

## 2019-07-07 NOTE — Therapy (Signed)
[facility] [place] [address] [place], [place], [address] Phone: [phone]   Fax:  [phone]  Physical Therapy Treatment  Patient Details  Name: Briana Parks MRN: [id number] Date of Birth: May 24, 1965 Referring Provider (PT): Jodi Geralds, MD   Encounter Date: 07/07/2019  PT End of Session - 07/07/19 1514    Visit Number  2    Number of Visits  12    Date for PT Re-Evaluation  08/16/19    PT Start Time  1514    PT Stop Time  1600    PT Time Calculation (min)  46 min    Activity Tolerance  Patient tolerated treatment well       Past Medical History:  Diagnosis Date  . ALLERGIC RHINITIS CAUSE UNSPECIFIED 08/03/2008   Qualifier: Diagnosis of  By: Thomos Lemons    . Arachnoid cyst   . Deviated septum    large turbinates  . Gestational diabetes   . Headache 09/30/2013  . 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 Kidney infection   . Low blood pressure   . Obstructive sleep apnea 7cm water pressure CPAP 10/24/2010   Moderate. Sleep study was performed at [facility] neurological clinic by Dr. Marliss Coots on 10/02/2010. AHI was 19.8 with desaturations to 79%. Frequent severe snoring. Moderate wedge works associated with respiratory events. Difficulty initiating and maintaining sleep with no slow wave sleep. She had severe OSA and REM.   . Osteoarthritis of left knee 10/26/2013  . Plantar fasciitis   . Pneumonia    history of  . PONV (postoperative nausea and vomiting)    hard to wake up after anesthesia, did not get PONV after 2017 surgery at [facility]. notes care everywhere  . Recurrent UTI   . Rosacea 09/13/2010  . Scoliosis   . Tinnitus   . Vertigo     Past Surgical History:  Procedure Laterality Date  . bone spur removal Right 02/17/2012   foot  . bone spur removal Left 08/2015  . HERNIA REPAIR Bilateral   . KNEE ARTHROSCOPY Left 03/16/14   [facility]   . TONSILLECTOMY AND  ADENOIDECTOMY    . TOTAL KNEE ARTHROPLASTY Left 06/11/2019   Procedure: TOTAL KNEE ARTHROPLASTY;  Surgeon: Jodi Geralds, MD;  Location: [facility];  Service: Orthopedics;  Laterality: Left;  . TUBAL LIGATION      There were no vitals filed for this visit.  Subjective Assessment - 07/07/19 1515    Subjective  Patient reports that she took pain meds about 1 hour ago. She took muscle relaxants earlier today and could tell her knee released and bent just a little bit easier. The patient is s/p LTKA on 06/11/19.. She is having lot of swelling and continued pain limiting exercise and ROM.  She feels she has lost some AROM into flexion due to pain.    Currently in Pain?  Yes    Pain Score  2     Pain Location  Knee    Pain Orientation  Left    Pain Descriptors / Indicators  Sore;Aching;Discomfort    Pain Type  Surgical pain    Pain Onset  1 to 4 weeks ago    Pain Frequency  Constant                       [facility] Adult PT Treatment/Exercise - 07/07/19 0001      Knee/Hip Exercises: Stretches  Passive Hamstring Stretch  Left;3 reps;30 seconds    Quad Stretch Limitations  sitting holding pillowcase under thigh allowing knee to bend 1-2 min       Knee/Hip Exercises: Aerobic   Nustep  L4 for ROM pushing with Rt LE to bend Lt LE - encouraging hold in Lt knee flexion       Knee/Hip Exercises: Standing   Hip Flexion  AROM;Left;4 sets;Knee bent    Hip Flexion Limitations  tapping toe up on 4 inch step for 2 sets of 10 and 6 inch step for 2 sets of 10 to work on knee flexion     Terminal Knee Extension  AROM;Strengthening;Left;10 reps   pressing posterior knee into 5 inch ball back to wall 10'   Forward Step Up Limitations  placing Lt foot on 6 inch step and shifting weight forward to bend knee 10 sec x 5    verbal and visual cues for correct technique      Knee/Hip Exercises: Seated   Heel Slides  AROM;Left;10 reps   10 sec hold sliding foot on pillow case to increase flexion      Knee/Hip Exercises: Supine   Short Arc Quad Sets  AROM;Strengthening;Left;10 reps   10 sec hold      Vasopneumatic   Number Minutes Vasopneumatic   10 minutes    Vasopnuematic Location   Knee    Vasopneumatic Pressure  Low    Vasopneumatic Temperature   34                  PT Long Term Goals - 07/05/19 1902      PT LONG TERM GOAL #1   Title  The patient will be indep with HEP.    Time  6    Period  Weeks    Target Date  08/16/19      PT LONG TERM GOAL #2   Title  The patient will report reduced functional limitation per FOTO from 70% to < or equal to 38%.    Time  6    Period  Weeks    Target Date  08/16/19      PT LONG TERM GOAL #3   Title  The patient will improve AROM R knee to 3-110 degrees.    Time  6    Period  Weeks    Target Date  08/16/19      PT LONG TERM GOAL #4   Title  The patient will improve gait speed to > or equal to 2.6 ft/sec for return to full community ambulation without a device.    Baseline  6    Time  6    Period  Weeks    Target Date  08/16/19      PT LONG TERM GOAL #5   Title  The patient will negotiate stairs in reciprocal pattern mod indep with one handrail.    Time  6    Period  Weeks    Target Date  08/16/19            Plan - 07/07/19 1522    Clinical Impression Statement  Continued edema and pain limiting exercise, ROM and functional mobility. Tolerated Nustep well for assisted ROM. Continued with ROM exercises for knee flexion and extension. Reviewed ambulation with walker and emcouraged patient to continue use of walker at home and for MD appointment tomorrow.  Vaso post treatment to address edema.    Rehab Potential  Good    PT  Frequency  2x / week    PT Duration  6 weeks    PT Treatment/Interventions  ADLs/Self Care Home Management;Cryotherapy;Iontophoresis 4mg /ml Dexamethasone;Moist Heat;Neuromuscular re-education;Therapeutic exercise;Therapeutic activities;Gait training;Stair training;Functional mobility  training;Patient/family education;Taping;Vasopneumatic Device;Passive range of motion;Manual techniques    PT Next Visit Plan  focus on gaining ROM, muscle inhibition, flexibility, LE strengthening (progressing HEP to include heel/toe raises, HS stretching), work on gait mechanics and increasing tolerance to L weight shifting.    PT Home Exercise Plan  Access Code: [email or web address]    Consulted and Agree with Plan of Care  Patient       Patient will benefit from skilled therapeutic intervention in order to improve the following deficits and impairments:     Visit Diagnosis: Acute pain of left knee  Muscle weakness (generalized)  Other abnormalities of gait and mobility  Localized edema     Problem List Patient Active Problem List   Diagnosis Date Noted  . Status post left knee surgery 06/11/2019  . Primary osteoarthritis of first carpometacarpal joint of left hand 01/26/2019  . Abnormal MRI of head 10/03/2015  . Primary osteoarthritis of left knee 10/26/2013  . Headache 09/30/2013  . Obstructive sleep apnea 7cm water pressure CPAP 10/24/2010  . Rosacea 09/13/2010  . ALLERGIC RHINITIS CAUSE UNSPECIFIED 08/03/2008  . SPONDYLOLISTHESIS, ACQUIRED 05/21/2006  . SCOLIOSIS 12/31/2005  . SHOULDER/ARM SPRAIN/STRAIN, UNSPEC. 12/31/2005    Manolito Jurewicz Rober Minion PT, MPH  07/07/2019, 3:55 PM  [facility] [address] [place], [place], [address] Phone: [phone]   Fax:  [phone]  Name: Briana Parks MRN: [id number] Date of Birth: 11-Oct-1965

## 2019-07-12 ENCOUNTER — Ambulatory Visit (INDEPENDENT_AMBULATORY_CARE_PROVIDER_SITE_OTHER): Payer: BC Managed Care – PPO | Admitting: Rehabilitative and Restorative Service Providers"

## 2019-07-12 ENCOUNTER — Other Ambulatory Visit: Payer: Self-pay

## 2019-07-12 ENCOUNTER — Encounter: Payer: Self-pay | Admitting: Rehabilitative and Restorative Service Providers"

## 2019-07-12 DIAGNOSIS — R2689 Other abnormalities of gait and mobility: Secondary | ICD-10-CM

## 2019-07-12 DIAGNOSIS — M25562 Pain in left knee: Secondary | ICD-10-CM

## 2019-07-12 DIAGNOSIS — R600 Localized edema: Secondary | ICD-10-CM | POA: Diagnosis not present

## 2019-07-12 DIAGNOSIS — M6281 Muscle weakness (generalized): Secondary | ICD-10-CM | POA: Diagnosis not present

## 2019-07-12 NOTE — Therapy (Signed)
[facility] [place] [address] [place], [place], [address] Phone: [phone]   Fax:  [phone]  Physical Therapy Treatment  Patient Details  Name: Briana Parks MRN: [id number] Date of Birth: 03-Apr-1965 Referring Provider (PT): Jodi Geralds, MD   Encounter Date: 07/12/2019  PT End of Session - 07/12/19 1606    Visit Number  3    Number of Visits  18    Date for PT Re-Evaluation  08/16/19    PT Start Time  1520    PT Stop Time  1610    PT Time Calculation (min)  50 min    Activity Tolerance  Patient tolerated treatment well    Behavior During Therapy  [facility] for tasks assessed/performed       Past Medical History:  Diagnosis Date  . ALLERGIC RHINITIS CAUSE UNSPECIFIED 08/03/2008   Qualifier: Diagnosis of  By: Thomos Lemons    . Arachnoid cyst   . Deviated septum    large turbinates  . Gestational diabetes   . Headache 09/30/2013  . 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 Kidney infection   . Low blood pressure   . Obstructive sleep apnea 7cm water pressure CPAP 10/24/2010   Moderate. Sleep study was performed at [facility] neurological clinic by Dr. Marliss Coots on 10/02/2010. AHI was 19.8 with desaturations to 79%. Frequent severe snoring. Moderate wedge works associated with respiratory events. Difficulty initiating and maintaining sleep with no slow wave sleep. She had severe OSA and REM.   . Osteoarthritis of left knee 10/26/2013  . Plantar fasciitis   . Pneumonia    history of  . PONV (postoperative nausea and vomiting)    hard to wake up after anesthesia, did not get PONV after 2017 surgery at [facility]. notes care everywhere  . Recurrent UTI   . Rosacea 09/13/2010  . Scoliosis   . Tinnitus   . Vertigo     Past Surgical History:  Procedure Laterality Date  . bone spur removal Right 02/17/2012   foot  . bone spur removal Left 08/2015  . HERNIA REPAIR Bilateral   . KNEE ARTHROSCOPY Left 03/16/14   [facility]   . TONSILLECTOMY AND ADENOIDECTOMY    . TOTAL KNEE ARTHROPLASTY Left 06/11/2019   Procedure: TOTAL KNEE ARTHROPLASTY;  Surgeon: Jodi Geralds, MD;  Location: [facility];  Service: Orthopedics;  Laterality: Left;  . TUBAL LIGATION      There were no vitals filed for this visit.  Subjective Assessment - 07/12/19 1604    Subjective  The patient reports that she took pain meds 1 hour ago.  She notes she did not need oxycontin over the weekend and was able to control her pain with tylenol + muscle relaxer.    Patient Stated Goals  Walking without a device without pain.    Currently in Pain?  Yes    Pain Score  4     Pain Location  Knee    Pain Orientation  Left    Pain Descriptors / Indicators  Aching;Sore;Discomfort    Pain Type  Surgical pain    Pain Onset  1 to 4 weeks ago    Pain Frequency  Constant    Aggravating Factors   movement, flexion    Pain Relieving Factors  rest                       [facility] Adult PT Treatment/Exercise -  07/12/19 1608      Exercises   Exercises  Knee/Hip      Knee/Hip Exercises: Stretches   Passive Hamstring Stretch  Left;2 reps;30 seconds    Passive Hamstring Stretch Limitations  with contract/relax of the hamstrings    Quad Stretch  Left;60 seconds    Quad Stretch Limitations  Supine with feet on the wall using R LE to slide L LE into flexion within tolerable range and hold x 60 seconds, x 3 reps.  Then performed for AROM x  5 reps sliding L heel up/down wall when in supine    Gastroc Stretch  1 rep;Right;Left;30 seconds      Knee/Hip Exercises: Aerobic   Stationary Bike  partial revolutions x 4 minute holding flexion at each position x 10 seconds for overpressure      Knee/Hip Exercises: Standing   Heel Raises  Both;10 reps    Hip Abduction  Right;Left;5 reps    Abduction Limitations  side stepping x 8 feet x 2 sets      Knee/Hip Exercises: Supine   Quad Sets  Strengthening;Left;10 reps    Quad Sets  Limitations  with tactile cues for extension    Short Arc Quad Sets  AROM;Strengthening;Left;10 reps    Patellar Mobs  patellar mobs in lateral and inferior to superior direction to patient tolerance    Other Supine Knee/Hip Exercises  Passive overpressure into knee flexion with L knee to chest and contract/relax to faciliate stretching.      Modalities   Modalities  Vasopneumatic      Vasopneumatic   Number Minutes Vasopneumatic   10 minutes    Vasopnuematic Location   Knee    Vasopneumatic Pressure  Low    Vasopneumatic Temperature   34      Manual Therapy   Manual Therapy  Soft tissue mobilization    Soft tissue mobilization  scar tissue mobilization and STM to L ITB and lateral quadriceps musculature             PT Education - 07/12/19 2100    Education Details  added scar tissue mobilization ot HEP    Person(s) Educated  Patient    Methods  Explanation;Demonstration;Handout    Comprehension  Returned demonstration;Verbalized understanding          PT Long Term Goals - 07/05/19 1902      PT LONG TERM GOAL #1   Title  The patient will be indep with HEP.    Time  6    Period  Weeks    Target Date  08/16/19      PT LONG TERM GOAL #2   Title  The patient will report reduced functional limitation per FOTO from 70% to < or equal to 38%.    Time  6    Period  Weeks    Target Date  08/16/19      PT LONG TERM GOAL #3   Title  The patient will improve AROM R knee to 3-110 degrees.    Time  6    Period  Weeks    Target Date  08/16/19      PT LONG TERM GOAL #4   Title  The patient will improve gait speed to > or equal to 2.6 ft/sec for return to full community ambulation without a device.    Baseline  6    Time  6    Period  Weeks    Target Date  08/16/19  PT LONG TERM GOAL #5   Title  The patient will negotiate stairs in reciprocal pattern mod indep with one handrail.    Time  6    Period  Weeks    Target Date  08/16/19            Plan -  07/12/19 1607    Clinical Impression Statement  The patient tolerated exercise well today, however continues with significant limitations in AAROM and PROM.  Patient noted she spoke with MD about progress and 3 times/week was recommended.  Due to current ROM limitations, PT to modify current plan to increase frequency to 3x/week in order to emphasize ROM gains, motor control, mobility, and to gain optimal progress.    Rehab Potential  Good    PT Frequency  3x / week    PT Duration  6 weeks    PT Treatment/Interventions  ADLs/Self Care Home Management;Cryotherapy;Iontophoresis 4mg /ml Dexamethasone;Moist Heat;Neuromuscular re-education;Therapeutic exercise;Therapeutic activities;Gait training;Stair training;Functional mobility training;Patient/family education;Taping;Vasopneumatic Device;Passive range of motion;Manual techniques    PT Next Visit Plan  focus on gaining ROM, muscle inhibition, flexibility, LE strengthening (progressing HEP to include heel/toe raises, HS stretching), work on gait mechanics and increasing tolerance to L weight shifting.    PT Home Exercise Plan  Access Code: [email or web address]    Consulted and Agree with Plan of Care  Patient       Patient will benefit from skilled therapeutic intervention in order to improve the following deficits and impairments:  Abnormal gait, Increased fascial restricitons, Decreased range of motion, Decreased strength, Decreased activity tolerance, Pain, Hypomobility, Impaired flexibility, Increased edema  Visit Diagnosis: Acute pain of left knee  Muscle weakness (generalized)  Other abnormalities of gait and mobility  Localized edema     Problem List Patient Active Problem List   Diagnosis Date Noted  . Status post left knee surgery 06/11/2019  . Primary osteoarthritis of first carpometacarpal joint of left hand 01/26/2019  . Abnormal MRI of head 10/03/2015  . Primary osteoarthritis of left knee 10/26/2013  . Headache 09/30/2013  .  Obstructive sleep apnea 7cm water pressure CPAP 10/24/2010  . Rosacea 09/13/2010  . ALLERGIC RHINITIS CAUSE UNSPECIFIED 08/03/2008  . SPONDYLOLISTHESIS, ACQUIRED 05/21/2006  . SCOLIOSIS 12/31/2005  . SHOULDER/ARM SPRAIN/STRAIN, UNSPEC. 12/31/2005    Commack, McDonald 07/12/2019, 9:09 PM  [facility] [place] [address] [place], [place], [address] Phone: [phone]   Fax:  [phone]  Name: JYLA HOPF MRN: [id number] Date of Birth: [birth year]

## 2019-07-12 NOTE — Patient Instructions (Signed)
Scar Tissue Massage    Place pad of fingertip on scar area. Apply steady downward pressure while moving in circular fashion. Use another fin-ger on top to assist. Repeat until entire scar has been covered. You can also move in side to side motion over scar. Repeat __2_ times per day.  Copyright  VHI. All rights reserved.

## 2019-07-14 ENCOUNTER — Other Ambulatory Visit: Payer: Self-pay

## 2019-07-14 ENCOUNTER — Ambulatory Visit (INDEPENDENT_AMBULATORY_CARE_PROVIDER_SITE_OTHER): Payer: BC Managed Care – PPO | Admitting: Physical Therapy

## 2019-07-14 DIAGNOSIS — M25562 Pain in left knee: Secondary | ICD-10-CM | POA: Diagnosis not present

## 2019-07-14 DIAGNOSIS — M6281 Muscle weakness (generalized): Secondary | ICD-10-CM

## 2019-07-14 DIAGNOSIS — R600 Localized edema: Secondary | ICD-10-CM

## 2019-07-14 DIAGNOSIS — R2689 Other abnormalities of gait and mobility: Secondary | ICD-10-CM | POA: Diagnosis not present

## 2019-07-14 NOTE — Therapy (Signed)
[facility] [place] [address] [place], [place], [address] Phone: [phone]   Fax:  [phone]  Physical Therapy Treatment  Patient Details  Name: Briana Parks MRN: [id number] Date of Birth: May 04, 1965 Referring Provider (PT): Jodi Geralds, MD   Encounter Date: 07/14/2019  PT End of Session - 07/14/19 1649    Visit Number  4    Number of Visits  18    Date for PT Re-Evaluation  08/16/19    PT Start Time  1602    PT Stop Time  1655    PT Time Calculation (min)  53 min    Activity Tolerance  Patient tolerated treatment well    Behavior During Therapy  [facility] for tasks assessed/performed       Past Medical History:  Diagnosis Date  . ALLERGIC RHINITIS CAUSE UNSPECIFIED 08/03/2008   Qualifier: Diagnosis of  By: Thomos Lemons    . Arachnoid cyst   . Deviated septum    large turbinates  . Gestational diabetes   . Headache 09/30/2013  . 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 Kidney infection   . Low blood pressure   . Obstructive sleep apnea 7cm water pressure CPAP 10/24/2010   Moderate. Sleep study was performed at [facility] neurological clinic by Dr. Marliss Coots on 10/02/2010. AHI was 19.8 with desaturations to 79%. Frequent severe snoring. Moderate wedge works associated with respiratory events. Difficulty initiating and maintaining sleep with no slow wave sleep. She had severe OSA and REM.   . Osteoarthritis of left knee 10/26/2013  . Plantar fasciitis   . Pneumonia    history of  . PONV (postoperative nausea and vomiting)    hard to wake up after anesthesia, did not get PONV after 2017 surgery at [facility]. notes care everywhere  . Recurrent UTI   . Rosacea 09/13/2010  . Scoliosis   . Tinnitus   . Vertigo     Past Surgical History:  Procedure Laterality Date  . bone spur removal Right 02/17/2012   foot  . bone spur removal Left 08/2015  . HERNIA REPAIR Bilateral   . KNEE ARTHROSCOPY Left 03/16/14   [facility]   . TONSILLECTOMY AND ADENOIDECTOMY    . TOTAL KNEE ARTHROPLASTY Left 06/11/2019   Procedure: TOTAL KNEE ARTHROPLASTY;  Surgeon: Jodi Geralds, MD;  Location: [facility];  Service: Orthopedics;  Laterality: Left;  . TUBAL LIGATION      There were no vitals filed for this visit.  Subjective Assessment - 07/14/19 1715    Subjective  Pt reports she bought a bicycle pedal apparatus and has used it for 5 min. She continues to complain of stiffness unless she takes muscle relaxer.    Patient Stated Goals  Walking without a device without pain.    Currently in Pain?  No/denies    Pain Score  0-No pain    Pain Location  Knee    Pain Orientation  Left         [facility] PT Assessment - 07/14/19 0001      Assessment   Medical Diagnosis  L total knee arthroplasty    Referring Provider (PT)  Jodi Geralds, MD    Onset Date/Surgical Date  06/11/19    Prior Therapy  known to our clinic from prior therapy      AROM   Left Knee Extension  -11      PROM   Left Knee Flexion  78  seated scoots       [facility] Adult PT Treatment/Exercise - 07/14/19 0001      Ambulation/Gait   Ambulation Distance (Feet)  100 Feet    Assistive device  Rolling walker    Gait Pattern  Step-through pattern;Decreased stance time - left;Decreased dorsiflexion - left;Decreased hip/knee flexion - left;Decreased weight shift to left;Left flexed knee in stance    Pre-Gait Activities  standing Lt knee flex /ext x 10 (while in toe off position) UE support on RW.     Gait Comments  VC for increased Lt heel strike, increased Lt knee flexion during swing through, and cues for upright posture.       Self-Care   Self-Care  Scar Mobilizations;Other Self-Care Comments    Scar Mobilizations  reviewed scar mobilization with pt; pt returned demo.     Other Self-Care Comments   Pt educated on patellar mobilizations and technique. Pt verbalized understanding and returned demo of inf/sup glides.       Knee/Hip  Exercises: Stretches   Passive Hamstring Stretch  Left;2 reps;30 seconds   foot on 12" step, with pt overpressure   Knee: Self-Stretch to increase Flexion  Left;10 seconds   10 reps; lunging forward on 12" step with BUE support @ rail     Knee/Hip Exercises: Aerobic   Nustep  L4: for ROM, using Rt UE and LLE to increase Lt knee flexion x 5.25 min       Knee/Hip Exercises: Standing   Forward Step Up  Left;1 set;10 reps;Hand Hold: 2;Step Height: 4"    Stairs  reciprocal pattern with BUE support on 4" step x 10 steps     SLS  Rt SLS with Lt foot taps to 6" step x 8 reps with cues for keeping hip down.       Knee/Hip Exercises: Seated   Sit to Sand  1 set;10 reps;with UE support;without UE support   cues for even weight and to control descent.      Knee/Hip Exercises: Supine   Other Supine Knee/Hip Exercises  seated scoots x 5-10 sec hold x 10 reps                   PT Long Term Goals - 07/05/19 1902      PT LONG TERM GOAL #1   Title  The patient will be indep with HEP.    Time  6    Period  Weeks    Target Date  08/16/19      PT LONG TERM GOAL #2   Title  The patient will report reduced functional limitation per FOTO from 70% to < or equal to 38%.    Time  6    Period  Weeks    Target Date  08/16/19      PT LONG TERM GOAL #3   Title  The patient will improve AROM R knee to 3-110 degrees.    Time  6    Period  Weeks    Target Date  08/16/19      PT LONG TERM GOAL #4   Title  The patient will improve gait speed to > or equal to 2.6 ft/sec for return to full community ambulation without a device.    Baseline  6    Time  6    Period  Weeks    Target Date  08/16/19      PT LONG TERM GOAL #5   Title  The patient will negotiate stairs  in reciprocal pattern mod indep with one handrail.    Time  6    Period  Weeks    Target Date  08/16/19            Plan - 07/14/19 1649    Clinical Impression Statement  Gradual improvement in Lt knee ROM.  Pt reported  only mild increase in pain during ROM /stretches, however pt also took pain medicine 1 hr prior to session.  Pt progressing towards goals.    Rehab Potential  Good    PT Frequency  3x / week    PT Duration  6 weeks    PT Treatment/Interventions  ADLs/Self Care Home Management;Cryotherapy;Iontophoresis 4mg /ml Dexamethasone;Moist Heat;Neuromuscular re-education;Therapeutic exercise;Therapeutic activities;Gait training;Stair training;Functional mobility training;Patient/family education;Taping;Vasopneumatic Device;Passive range of motion;Manual techniques    PT Next Visit Plan  Gait; ROM; consolidate HEP ( HH and outpt).    PT Home Exercise Plan  Access Code: [email or web address]    Consulted and Agree with Plan of Care  Patient       Patient will benefit from skilled therapeutic intervention in order to improve the following deficits and impairments:  Abnormal gait, Increased fascial restricitons, Decreased range of motion, Decreased strength, Decreased activity tolerance, Pain, Hypomobility, Impaired flexibility, Increased edema  Visit Diagnosis: Acute pain of left knee  Muscle weakness (generalized)  Other abnormalities of gait and mobility  Localized edema     Problem List Patient Active Problem List   Diagnosis Date Noted  . Status post left knee surgery 06/11/2019  . Primary osteoarthritis of first carpometacarpal joint of left hand 01/26/2019  . Abnormal MRI of head 10/03/2015  . Primary osteoarthritis of left knee 10/26/2013  . Headache 09/30/2013  . Obstructive sleep apnea 7cm water pressure CPAP 10/24/2010  . Rosacea 09/13/2010  . ALLERGIC RHINITIS CAUSE UNSPECIFIED 08/03/2008  . SPONDYLOLISTHESIS, ACQUIRED 05/21/2006  . SCOLIOSIS 12/31/2005  . SHOULDER/ARM SPRAIN/STRAIN, UNSPEC. 12/31/2005   03/02/2006, PTA 07/14/19 5:17 PM  [facility] [place] [address] [place], [place], [place] Phone: [phone]   Fax:   [phone]  Name: Briana Parks MRN: Annamarie Major Date of Birth: 06-03-1965

## 2019-07-14 NOTE — Addendum Note (Signed)
Addended by: Margretta Ditty M on: 07/14/2019 07:51 AM   Modules accepted: Orders

## 2019-07-16 ENCOUNTER — Encounter: Payer: Self-pay | Admitting: Physical Therapy

## 2019-07-16 ENCOUNTER — Ambulatory Visit (INDEPENDENT_AMBULATORY_CARE_PROVIDER_SITE_OTHER): Payer: BC Managed Care – PPO | Admitting: Physical Therapy

## 2019-07-16 ENCOUNTER — Other Ambulatory Visit: Payer: Self-pay

## 2019-07-16 DIAGNOSIS — M6281 Muscle weakness (generalized): Secondary | ICD-10-CM

## 2019-07-16 DIAGNOSIS — M25562 Pain in left knee: Secondary | ICD-10-CM

## 2019-07-16 DIAGNOSIS — R2689 Other abnormalities of gait and mobility: Secondary | ICD-10-CM | POA: Diagnosis not present

## 2019-07-16 DIAGNOSIS — R600 Localized edema: Secondary | ICD-10-CM | POA: Diagnosis not present

## 2019-07-16 NOTE — Patient Instructions (Addendum)
Access Code: [email or web address]: [email or web address]: 04/23/2021Prepared by: [facility] [place]  Supine Short Arc Quad - 2 x daily - 7 x weekly - 10 reps - 1 sets  Long Sitting Quad Set with Towel Roll Under Heel - 2 x daily - 7 x weekly - 1 sets - 10 reps  Sidelying Hip Abduction - 2 x daily - 7 x weekly - 10 reps - 1 sets  Gastroc Stretch on Wall - 2 x daily - 7 x weekly - 1 sets - 2 reps - 30 seconds hold  Prone Knee Extension Hang - 2 x daily - 7 x weekly - 1 sets - 2-3 reps - 15-30 hold  Seated Knee Flexion Stretch - 2 x daily - 7 x weekly - 1 sets - 10 reps - 10 hold  Sit to Stand - 2 x daily - 7 x weekly - 1 sets - 10 reps

## 2019-07-16 NOTE — Therapy (Signed)
[facility] [place] [address] [place], [place], [address] Phone: [phone]   Fax:  [phone]  Physical Therapy Treatment  Patient Details  Name: Briana Parks MRN: [id number] Date of Birth: 22-Oct-1965 Referring Provider (PT): Jodi Geralds, MD   Encounter Date: 07/16/2019  PT End of Session - 07/16/19 1357    Visit Number  5    Number of Visits  18    Date for PT Re-Evaluation  08/16/19    PT Start Time  1320    PT Stop Time  1406    PT Time Calculation (min)  46 min    Activity Tolerance  Patient tolerated treatment well    Behavior During Therapy  [facility] for tasks assessed/performed       Past Medical History:  Diagnosis Date  . ALLERGIC RHINITIS CAUSE UNSPECIFIED 08/03/2008   Qualifier: Diagnosis of  By: Thomos Lemons    . Arachnoid cyst   . Deviated septum    large turbinates  . Gestational diabetes   . Headache 09/30/2013  . 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 Kidney infection   . Low blood pressure   . Obstructive sleep apnea 7cm water pressure CPAP 10/24/2010   Moderate. Sleep study was performed at [facility] neurological clinic by Dr. Marliss Coots on 10/02/2010. AHI was 19.8 with desaturations to 79%. Frequent severe snoring. Moderate wedge works associated with respiratory events. Difficulty initiating and maintaining sleep with no slow wave sleep. She had severe OSA and REM.   . Osteoarthritis of left knee 10/26/2013  . Plantar fasciitis   . Pneumonia    history of  . PONV (postoperative nausea and vomiting)    hard to wake up after anesthesia, did not get PONV after 2017 surgery at [facility]. notes care everywhere  . Recurrent UTI   . Rosacea 09/13/2010  . Scoliosis   . Tinnitus   . Vertigo     Past Surgical History:  Procedure Laterality Date  . bone spur removal Right 02/17/2012   foot  . bone spur removal Left 08/2015  . HERNIA REPAIR Bilateral   . KNEE ARTHROSCOPY Left 03/16/14   [facility]   . TONSILLECTOMY AND ADENOIDECTOMY    . TOTAL KNEE ARTHROPLASTY Left 06/11/2019   Procedure: TOTAL KNEE ARTHROPLASTY;  Surgeon: Jodi Geralds, MD;  Location: [facility];  Service: Orthopedics;  Laterality: Left;  . TUBAL LIGATION      There were no vitals filed for this visit.  Subjective Assessment - 07/16/19 1317    Subjective  Pt states that she is using the bike aparatus for 5 mins daily. She continues to complain of stiffness unless taking her medication. She reports that she has been doing her scar massage everyday, 3 times    Currently in Pain?  Yes    Pain Score  1     Pain Location  Knee    Pain Orientation  Left;Anterior    Pain Frequency  Intermittent    Aggravating Factors   flexion, stairs    Pain Relieving Factors  rest, massage         [facility] PT Assessment - 07/16/19 0001      Assessment   Medical Diagnosis  L total knee arthroplasty    Referring Provider (PT)  Jodi Geralds, MD    Onset Date/Surgical Date  06/11/19    Prior Therapy  known to our clinic from prior therapy  [facility] Adult PT Treatment/Exercise - 07/16/19 0001      Knee/Hip Exercises: Stretches   Knee: Self-Stretch to increase Flexion  Left;10 seconds   10 reps; lunging forward on 12" step with BUE support @ rail   Other Knee/Hip Stretches  seated scoot; 5 reps, 20 seconds      Knee/Hip Exercises: Aerobic   Nustep  L3: for ROM, using Rt UE and LLE to increase Lt knee flexion x 5.25 min     Other Aerobic  single laps around gym (with RW) in between exercises to decrease stiffness.       Knee/Hip Exercises: Standing   Functional Squat  5 sets;5 reps   descending height of high/low table, 2 reps, last 3 sets   Stairs  5 reps step ups with Lt LE, 2 sets, bilat hand hold   4 inch step, progressed to 6 inch     Knee/Hip Exercises: Prone   Hamstring Curl  1 set;5 reps   between prone hangs   Prone Knee Hang  2 minutes   feet and ankles off mat, pillow case under knee      Vasopneumatic   Number Minutes Vasopneumatic   10 minutes    Vasopnuematic Location   Knee    Vasopneumatic Pressure  Low    Vasopneumatic Temperature   34             PT Education - 07/16/19 1356    Education Details  HEP    Person(s) Educated  Patient    Methods  Explanation;Handout;Demonstration;Tactile cues;Verbal cues    Comprehension  Verbalized understanding;Returned demonstration          PT Long Term Goals - 07/16/19 1442      PT LONG TERM GOAL #1   Title  The patient will be indep with HEP.    Time  6    Period  Weeks      PT LONG TERM GOAL #2   Title  The patient will report reduced functional limitation per FOTO from 70% to < or equal to 38%.    Time  6    Period  Weeks      PT LONG TERM GOAL #3   Title  The patient will improve AROM R knee to 3-110 degrees.    Time  6    Period  Weeks      PT LONG TERM GOAL #4   Title  The patient will improve gait speed to > or equal to 2.6 ft/sec for return to full community ambulation without a device.    Baseline  6    Time  6    Period  Weeks      PT LONG TERM GOAL #5   Title  The patient will negotiate stairs in reciprocal pattern mod indep with one handrail.    Time  6    Period  Weeks            Plan - 07/16/19 1440    Clinical Impression Statement  Improvement in Lt knee flexion ROM; updates made to HEP to progress ROM further. Pt reported stiffness and tightness in Lt knee during therapy today, but tolerated well. Progressing towards goals.    Stability/Clinical Decision Making  Stable/Uncomplicated    Rehab Potential  Good    PT Frequency  3x / week    PT Duration  6 weeks    PT Treatment/Interventions  ADLs/Self Care Home Management;Cryotherapy;Iontophoresis 4mg /ml Dexamethasone;Moist Heat;Neuromuscular re-education;Therapeutic exercise;Therapeutic activities;Gait training;Stair training;Functional  mobility training;Patient/family education;Taping;Vasopneumatic Device;Passive range of  motion;Manual techniques    PT Next Visit Plan  Gait; ROM; consolidate HEP ( HH and outpt).    PT Home Exercise Plan  Access Code: [email or web address]       Patient will benefit from skilled therapeutic intervention in order to improve the following deficits and impairments:  Abnormal gait, Increased fascial restricitons, Decreased range of motion, Decreased strength, Decreased activity tolerance, Pain, Hypomobility, Impaired flexibility, Increased edema  Visit Diagnosis: Acute pain of left knee  Muscle weakness (generalized)  Other abnormalities of gait and mobility  Localized edema     Problem List Patient Active Problem List   Diagnosis Date Noted  . Status post left knee surgery 06/11/2019  . Primary osteoarthritis of first carpometacarpal joint of left hand 01/26/2019  . Abnormal MRI of head 10/03/2015  . Primary osteoarthritis of left knee 10/26/2013  . Headache 09/30/2013  . Obstructive sleep apnea 7cm water pressure CPAP 10/24/2010  . Rosacea 09/13/2010  . ALLERGIC RHINITIS CAUSE UNSPECIFIED 08/03/2008  . SPONDYLOLISTHESIS, ACQUIRED 05/21/2006  . SCOLIOSIS 12/31/2005  . SHOULDER/ARM SPRAIN/STRAIN, UNSPEC. 12/31/2005    Ronaldo Miyamoto, SPTA 07/16/19 2:43 PM  This entire session was performed under direct supervision and direction of a licensed Physical Brewing technologist. I have personally read, edited and approved of the note as written.  Kerin Perna, PTA 07/16/19 3:11 PM   [facility] [place], [place], [address] Phone: [phone]   Fax:  [phone]  Name: Briana Parks MRN: [id number] Date of Birth: [birth year]

## 2019-07-19 ENCOUNTER — Ambulatory Visit (INDEPENDENT_AMBULATORY_CARE_PROVIDER_SITE_OTHER): Payer: BC Managed Care – PPO | Admitting: Rehabilitative and Restorative Service Providers"

## 2019-07-19 ENCOUNTER — Other Ambulatory Visit: Payer: Self-pay

## 2019-07-19 ENCOUNTER — Encounter: Payer: Self-pay | Admitting: Rehabilitative and Restorative Service Providers"

## 2019-07-19 DIAGNOSIS — M25562 Pain in left knee: Secondary | ICD-10-CM | POA: Diagnosis not present

## 2019-07-19 DIAGNOSIS — R600 Localized edema: Secondary | ICD-10-CM

## 2019-07-19 DIAGNOSIS — M6281 Muscle weakness (generalized): Secondary | ICD-10-CM

## 2019-07-19 DIAGNOSIS — R2689 Other abnormalities of gait and mobility: Secondary | ICD-10-CM

## 2019-07-19 NOTE — Therapy (Signed)
[facility] [place] [address] [place], [place], [address] Phone: [phone]   Fax:  [phone]  Physical Therapy Treatment  Patient Details  Name: Briana Parks MRN: [id number] Date of Birth: 05/10/1965 Referring Provider (PT): Jodi Geralds, MD   Encounter Date: 07/19/2019  PT End of Session - 07/19/19 1535    Visit Number  6    Number of Visits  18    Date for PT Re-Evaluation  08/16/19    PT Start Time  1511    PT Stop Time  1600    PT Time Calculation (min)  49 min    Activity Tolerance  Patient tolerated treatment well    Behavior During Therapy  [facility] for tasks assessed/performed       Past Medical History:  Diagnosis Date  . ALLERGIC RHINITIS CAUSE UNSPECIFIED 08/03/2008   Qualifier: Diagnosis of  By: Thomos Lemons    . Arachnoid cyst   . Deviated septum    large turbinates  . Gestational diabetes   . Headache 09/30/2013  . 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 Kidney infection   . Low blood pressure   . Obstructive sleep apnea 7cm water pressure CPAP 10/24/2010   Moderate. Sleep study was performed at [facility] neurological clinic by Dr. Marliss Coots on 10/02/2010. AHI was 19.8 with desaturations to 79%. Frequent severe snoring. Moderate wedge works associated with respiratory events. Difficulty initiating and maintaining sleep with no slow wave sleep. She had severe OSA and REM.   . Osteoarthritis of left knee 10/26/2013  . Plantar fasciitis   . Pneumonia    history of  . PONV (postoperative nausea and vomiting)    hard to wake up after anesthesia, did not get PONV after 2017 surgery at [facility]. notes care everywhere  . Recurrent UTI   . Rosacea 09/13/2010  . Scoliosis   . Tinnitus   . Vertigo     Past Surgical History:  Procedure Laterality Date  . bone spur removal Right 02/17/2012   foot  . bone spur removal Left 08/2015  . HERNIA REPAIR Bilateral   . KNEE ARTHROSCOPY Left 03/16/14   [facility]   . TONSILLECTOMY AND ADENOIDECTOMY    . TOTAL KNEE ARTHROPLASTY Left 06/11/2019   Procedure: TOTAL KNEE ARTHROPLASTY;  Surgeon: Jodi Geralds, MD;  Location[facility];  Service: Orthopedics;  Laterality: Left;  . TUBAL LIGATION      There were no vitals filed for this visit.  Subjective Assessment - 07/19/19 1535    Subjective  Went shopping this weekend for a couple of hours and did OK. Used the shopping cart to assist with walking. Working on her exercises at home. Thinks she is walking better. Pain is not bad today - just took pain meds and muscle relaxant before therapy.    Currently in Pain?  Yes    Pain Score  1                        [facility] Adult PT Treatment/Exercise - 07/19/19 0001      Knee/Hip Exercises: Stretches   Knee: Self-Stretch to increase Flexion  Left;10 seconds   10 reps; lunging forward on 12" step with BUE support @ rail   Knee: Self-Stretch Limitations  supine hip/knee 90/90 allowing gravity to assist with flexion x 2     Other Knee/Hip Stretches  seated scoot; 5 reps, 20 seconds  Knee/Hip Exercises: Aerobic   Nustep  L5 x 5 min for ROM, using Rt UE and LLE to increase Lt knee flexion x 5.25 min     Other Aerobic  single laps around gym (with RW) in between exercises to decrease stiffness.       Knee/Hip Exercises: Supine   Knee Flexion  AAROM;Left;5 reps   assisting with strap 15 sec x 5 reps    Other Supine Knee/Hip Exercises  rolling ball into flexion using strap - 10 sec hold x 10       Vasopneumatic   Number Minutes Vasopneumatic   10 minutes    Vasopnuematic Location   Knee    Vasopneumatic Pressure  Medium    Vasopneumatic Temperature   34      Manual Therapy   Manual therapy comments  pt supine     Joint Mobilization  mob tibia on femur     Passive ROM  into knee flexion                   PT Long Term Goals - 07/16/19 1442      PT LONG TERM GOAL #1   Title  The patient will be indep  with HEP.    Time  6    Period  Weeks      PT LONG TERM GOAL #2   Title  The patient will report reduced functional limitation per FOTO from 70% to < or equal to 38%.    Time  6    Period  Weeks      PT LONG TERM GOAL #3   Title  The patient will improve AROM R knee to 3-110 degrees.    Time  6    Period  Weeks      PT LONG TERM GOAL #4   Title  The patient will improve gait speed to > or equal to 2.6 ft/sec for return to full community ambulation without a device.    Baseline  6    Time  6    Period  Weeks      PT LONG TERM GOAL #5   Title  The patient will negotiate stairs in reciprocal pattern mod indep with one handrail.    Time  6    Period  Weeks            Plan - 07/19/19 1558    Clinical Impression Statement  Reviewed exercises and suggested exercises for HEP. Worked on knee flexion with patient in standing, sitting and supine. Continued stiffness and tightness.    Rehab Potential  Good    PT Frequency  3x / week    PT Duration  6 weeks    PT Treatment/Interventions  ADLs/Self Care Home Management;Cryotherapy;Iontophoresis 4mg /ml Dexamethasone;Moist Heat;Neuromuscular re-education;Therapeutic exercise;Therapeutic activities;Gait training;Stair training;Functional mobility training;Patient/family education;Taping;Vasopneumatic Device;Passive range of motion;Manual techniques    PT Next Visit Plan  Gait; ROM    PT Home Exercise Plan  Access Code: [email or web address]    Consulted and Agree with Plan of Care  Patient       Patient will benefit from skilled therapeutic intervention in order to improve the following deficits and impairments:     Visit Diagnosis: Acute pain of left knee  Muscle weakness (generalized)  Other abnormalities of gait and mobility  Localized edema     Problem List Patient Active Problem List   Diagnosis Date Noted  . Status post left knee surgery 06/11/2019  . Primary osteoarthritis of first  carpometacarpal joint of left hand 01/26/2019   . Abnormal MRI of head 10/03/2015  . Primary osteoarthritis of left knee 10/26/2013  . Headache 09/30/2013  . Obstructive sleep apnea 7cm water pressure CPAP 10/24/2010  . Rosacea 09/13/2010  . ALLERGIC RHINITIS CAUSE UNSPECIFIED 08/03/2008  . SPONDYLOLISTHESIS, ACQUIRED 05/21/2006  . SCOLIOSIS 12/31/2005  . SHOULDER/ARM SPRAIN/STRAIN, UNSPEC. 12/31/2005    Jarell Mcewen Rober Minion PT, MPH  07/19/2019, 4:02 PM  [facility] [address] [place], [place], [address] Phone: [phone]   Fax:  [phone]  Name: Briana Parks MRN: [id number] Date of Birth: 05-May-1965

## 2019-07-21 ENCOUNTER — Encounter: Payer: Self-pay | Admitting: Physical Therapy

## 2019-07-21 ENCOUNTER — Other Ambulatory Visit: Payer: Self-pay

## 2019-07-21 ENCOUNTER — Ambulatory Visit (INDEPENDENT_AMBULATORY_CARE_PROVIDER_SITE_OTHER): Payer: BC Managed Care – PPO | Admitting: Physical Therapy

## 2019-07-21 DIAGNOSIS — M25562 Pain in left knee: Secondary | ICD-10-CM | POA: Diagnosis not present

## 2019-07-21 DIAGNOSIS — R2689 Other abnormalities of gait and mobility: Secondary | ICD-10-CM | POA: Diagnosis not present

## 2019-07-21 DIAGNOSIS — M6281 Muscle weakness (generalized): Secondary | ICD-10-CM | POA: Diagnosis not present

## 2019-07-21 DIAGNOSIS — R600 Localized edema: Secondary | ICD-10-CM | POA: Diagnosis not present

## 2019-07-21 NOTE — Therapy (Signed)
[facility] [place] [address] [place], [place], [address] Phone: [phone]   Fax:  [phone]  Physical Therapy Treatment  Patient Details  Name: Briana Parks MRN: [id number] Date of Birth: Jul 19, 1965 Referring Provider (PT): Jodi Geralds, MD   Encounter Date: 07/21/2019  PT End of Session - 07/21/19 1643    Visit Number  7    Number of Visits  18    Date for PT Re-Evaluation  08/16/19    PT Start Time  1558    PT Stop Time  1649    PT Time Calculation (min)  51 min    Activity Tolerance  Patient tolerated treatment well    Behavior During Therapy  [facility] for tasks assessed/performed       Past Medical History:  Diagnosis Date  . ALLERGIC RHINITIS CAUSE UNSPECIFIED 08/03/2008   Qualifier: Diagnosis of  By: Thomos Lemons    . Arachnoid cyst   . Deviated septum    large turbinates  . Gestational diabetes   . Headache 09/30/2013  . 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 Kidney infection   . Low blood pressure   . Obstructive sleep apnea 7cm water pressure CPAP 10/24/2010   Moderate. Sleep study was performed at [facility] neurological clinic by Dr. Marliss Coots on 10/02/2010. AHI was 19.8 with desaturations to 79%. Frequent severe snoring. Moderate wedge works associated with respiratory events. Difficulty initiating and maintaining sleep with no slow wave sleep. She had severe OSA and REM.   . Osteoarthritis of left knee 10/26/2013  . Plantar fasciitis   . Pneumonia    history of  . PONV (postoperative nausea and vomiting)    hard to wake up after anesthesia, did not get PONV after 2017 surgery at [facility]. notes care everywhere  . Recurrent UTI   . Rosacea 09/13/2010  . Scoliosis   . Tinnitus   . Vertigo     Past Surgical History:  Procedure Laterality Date  . bone spur removal Right 02/17/2012   foot  . bone spur removal Left 08/2015  . HERNIA REPAIR Bilateral   . KNEE ARTHROSCOPY Left 03/16/14   [facility]   . TONSILLECTOMY AND ADENOIDECTOMY    . TOTAL KNEE ARTHROPLASTY Left 06/11/2019   Procedure: TOTAL KNEE ARTHROPLASTY;  Surgeon: Jodi Geralds, MD;  [facility];  Service: Orthopedics;  Laterality: Left;  . TUBAL LIGATION      There were no vitals filed for this visit.  Subjective Assessment - 07/21/19 1612    Subjective  Pt reports she has used floor bike pedals 1x/day, but isn't able to go all the way around without lifting hip.  Otherwise no new changes.    Patient Stated Goals  Walking without a device without pain.    Currently in Pain?  No/denies    Pain Score  0-No pain         [facility] PT Assessment - 07/21/19 0001      Assessment   Medical Diagnosis  L total knee arthroplasty    Referring Provider (PT)  Jodi Geralds, MD    Onset Date/Surgical Date  06/11/19    Next MD Visit  07/29/19    Prior Therapy  known to our clinic from prior therapy      PROM   Left Knee Extension  -15    Left Knee Flexion  90    during ROM on bike  Flexibility   Soft Tissue Assessment /Muscle Length  yes    Quadriceps  Lt: 67 deg        [facility] Adult PT Treatment/Exercise - 07/21/19 0001      Knee/Hip Exercises: Stretches   Passive Hamstring Stretch  Left;2 reps;20 seconds    Quad Stretch  Left;30 seconds;3 reps   prone with strap   Knee: Self-Stretch to increase Flexion  Left;10 seconds   10 reps; lunging forward on 12" step with BUE support @ Higher education careers adviser  Both;2 reps;30 seconds   incline board.      Knee/Hip Exercises: Aerobic   Recumbent Bike  partial revolutions for ROM x 4 min     Nustep  L3 x 3.5 min for ROM.     Other Aerobic  single laps around gym (with RW) in between exercises to decrease stiffness.       Knee/Hip Exercises: Standing   Lateral Step Up  Left;1 set;10 reps;Hand Hold: 2;Step Height: 6"    Forward Step Up  Left;1 set;10 reps;Hand Hold: 2;Step Height: 6"    Step Down  Right;1 set;5 reps;Hand Hold: 2;Step Height: 4"    and retro step up with LLE; heavy UE on rail.    SLS  Lt SLS x 8 sec; x 15 sec       Vasopneumatic   Number Minutes Vasopneumatic   10 minutes    Vasopnuematic Location   Knee    Vasopneumatic Pressure  Medium    Vasopneumatic Temperature   34                  PT Long Term Goals - 07/16/19 1442      PT LONG TERM GOAL #1   Title  The patient will be indep with HEP.    Time  6    Period  Weeks      PT LONG TERM GOAL #2   Title  The patient will report reduced functional limitation per FOTO from 70% to < or equal to 38%.    Time  6    Period  Weeks      PT LONG TERM GOAL #3   Title  The patient will improve AROM R knee to 3-110 degrees.    Time  6    Period  Weeks      PT LONG TERM GOAL #4   Title  The patient will improve gait speed to > or equal to 2.6 ft/sec for return to full community ambulation without a device.    Baseline  6    Time  6    Period  Weeks      PT LONG TERM GOAL #5   Title  The patient will negotiate stairs in reciprocal pattern mod indep with one handrail.    Time  6    Period  Weeks            Plan - 07/21/19 1644    Clinical Impression Statement  Pt demonstrating improved Lt knee flexion ROM.  Pt able to tolerate ascending 6" step with less difficulty.  Step downs on 4" step are still challenging.  Encouraged pt to return to wearing ted hose due to increase visible swelling in LLE lower leg/ankle. Progressing towards goals.    Rehab Potential  Good    PT Frequency  3x / week    PT Duration  6 weeks    PT Treatment/Interventions  ADLs/Self Care Home Management;Cryotherapy;Iontophoresis 4mg /ml Dexamethasone;Moist Heat;Neuromuscular  re-education;Therapeutic exercise;Therapeutic activities;Gait training;Stair training;Functional mobility training;Patient/family education;Taping;Vasopneumatic Device;Passive range of motion;Manual techniques    PT Next Visit Plan  continue progressive ROM / functional strengthening for Lt knee.    PT  Home Exercise Plan  Access Code: [email or web address]    Consulted and Agree with Plan of Care  Patient       Patient will benefit from skilled therapeutic intervention in order to improve the following deficits and impairments:  Abnormal gait, Increased fascial restricitons, Decreased range of motion, Decreased strength, Decreased activity tolerance, Pain, Hypomobility, Impaired flexibility, Increased edema  Visit Diagnosis: Acute pain of left knee  Muscle weakness (generalized)  Other abnormalities of gait and mobility  Localized edema     Problem List Patient Active Problem List   Diagnosis Date Noted  . Status post left knee surgery 06/11/2019  . Primary osteoarthritis of first carpometacarpal joint of left hand 01/26/2019  . Abnormal MRI of head 10/03/2015  . Primary osteoarthritis of left knee 10/26/2013  . Headache 09/30/2013  . Obstructive sleep apnea 7cm water pressure CPAP 10/24/2010  . Rosacea 09/13/2010  . ALLERGIC RHINITIS CAUSE UNSPECIFIED 08/03/2008  . SPONDYLOLISTHESIS, ACQUIRED 05/21/2006  . SCOLIOSIS 12/31/2005  . SHOULDER/ARM SPRAIN/STRAIN, UNSPEC. 12/31/2005   Kerin Perna, PTA 07/21/19 4:55 PM  [place], [place], [address] Phone: [phone]   Fax:  [phone]  Name: MAESYN FRISINGER MRN: [id number] Date of Birth: October 30, 1965

## 2019-07-23 ENCOUNTER — Ambulatory Visit (INDEPENDENT_AMBULATORY_CARE_PROVIDER_SITE_OTHER): Payer: BC Managed Care – PPO | Admitting: Physical Therapy

## 2019-07-23 ENCOUNTER — Other Ambulatory Visit: Payer: Self-pay

## 2019-07-23 DIAGNOSIS — M25562 Pain in left knee: Secondary | ICD-10-CM

## 2019-07-23 DIAGNOSIS — R2689 Other abnormalities of gait and mobility: Secondary | ICD-10-CM

## 2019-07-23 DIAGNOSIS — R600 Localized edema: Secondary | ICD-10-CM

## 2019-07-23 DIAGNOSIS — M6281 Muscle weakness (generalized): Secondary | ICD-10-CM

## 2019-07-23 NOTE — Therapy (Signed)
[facility] [place] [address] [place], [place], [address] Phone: [phone]   Fax:  [phone]  Physical Therapy Treatment  Patient Details  Name: Briana Parks MRN: [id number] Date of Birth: 11-12-1965 Referring Provider (PT): Jodi Geralds, MD   Encounter Date: 07/23/2019  PT End of Session - 07/23/19 1534    Visit Number  8    Number of Visits  18    Date for PT Re-Evaluation  08/16/19    PT Start Time  1529    PT Stop Time  1619    PT Time Calculation (min)  50 min    Activity Tolerance  Patient tolerated treatment well    Behavior During Therapy  [facility] for tasks assessed/performed       Past Medical History:  Diagnosis Date  . ALLERGIC RHINITIS CAUSE UNSPECIFIED 08/03/2008   Qualifier: Diagnosis of  By: Thomos Lemons    . Arachnoid cyst   . Deviated septum    large turbinates  . Gestational diabetes   . Headache 09/30/2013  . 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 Kidney infection   . Low blood pressure   . Obstructive sleep apnea 7cm water pressure CPAP 10/24/2010   Moderate. Sleep study was performed at [facility] neurological clinic by Dr. Marliss Coots on 10/02/2010. AHI was 19.8 with desaturations to 79%. Frequent severe snoring. Moderate wedge works associated with respiratory events. Difficulty initiating and maintaining sleep with no slow wave sleep. She had severe OSA and REM.   . Osteoarthritis of left knee 10/26/2013  . Plantar fasciitis   . Pneumonia    history of  . PONV (postoperative nausea and vomiting)    hard to wake up after anesthesia, did not get PONV after 2017 surgery at [facility]. notes care everywhere  . Recurrent UTI   . Rosacea 09/13/2010  . Scoliosis   . Tinnitus   . Vertigo     Past Surgical History:  Procedure Laterality Date  . bone spur removal Right 02/17/2012   foot  . bone spur removal Left 08/2015  . HERNIA REPAIR Bilateral   . KNEE ARTHROSCOPY Left 03/16/14   [facility]   . TONSILLECTOMY AND ADENOIDECTOMY    . TOTAL KNEE ARTHROPLASTY Left 06/11/2019   Procedure: TOTAL KNEE ARTHROPLASTY;  Surgeon: Jodi Geralds, MD;  Location: [facility];  Service: Orthopedics;  Laterality: Left;  . TUBAL LIGATION      There were no vitals filed for this visit.  Subjective Assessment - 07/23/19 1534    Subjective  Pt reports having used the floor bike twice yesterday and wore the stocking yesterday. She states that she is having trouble sleeping, she has been taking OTC medication to sleep; few hours at a time.    Currently in Pain?  No/denies    Pain Score  0-No pain    Pain Location  Knee    Pain Orientation  Left    Pain Onset  1 to 4 weeks ago         [facility] PT Assessment - 07/23/19 0001      Assessment   Medical Diagnosis  L total knee arthroplasty    Referring Provider (PT)  Jodi Geralds, MD    Onset Date/Surgical Date  06/11/19    Next MD Visit  07/29/19    Prior Therapy  known to our clinic from prior therapy      [facility] Adult PT Treatment/Exercise - 07/23/19  0001      Knee/Hip Exercises: Stretches   Passive Hamstring Stretch  Left;2 reps;30 seconds   supine with strap   Quad Stretch  Left;30 seconds;3 reps   prone with strap   Gastroc Stretch  Both;2 reps;20 seconds      Knee/Hip Exercises: Aerobic   Recumbent Bike  partial revolutions for ROM x 7 min     Other Aerobic  single laps around gym (with RW) in between exercises to decrease stiffness.       Knee/Hip Exercises: Standing   Hip Abduction  AROM;Left;1 set;10 reps   checking form, HEP from [facility]   Hip Extension  AROM;Left;1 set;5 reps   checking form, HEP from [facility]   Lateral Step Up  Left;1 set;10 reps;Hand Hold: 2;Step Height: 6"    Forward Step Up  Left;1 set;10 reps;Hand Hold: 2;Step Height: 6"    Step Down  Right;1 set;5 reps;Hand Hold: 2;Step Height: 4"    SLS  Lt SLS x 15 sec x 2 reps without UE support      Knee/Hip Exercises: Sidelying   Clams  LLE x 10    cues  to slow speed and for technique     Vasopneumatic   Number Minutes Vasopneumatic   10 minutes    Vasopnuematic Location   Knee   Lt   Vasopneumatic Pressure  Medium    Vasopneumatic Temperature   34 deg      Manual Therapy   Joint Mobilization  Lt patellar mobs (superior/inferior); Lt tibiofemoral ant/post glides     Passive ROM  into Lt knee ext x 15 sec x 3 reps         PT Long Term Goals - 07/16/19 1442      PT LONG TERM GOAL #1   Title  The patient will be indep with HEP.    Time  6    Period  Weeks      PT LONG TERM GOAL #2   Title  The patient will report reduced functional limitation per FOTO from 70% to < or equal to 38%.    Time  6    Period  Weeks      PT LONG TERM GOAL #3   Title  The patient will improve AROM R knee to 3-110 degrees.    Time  6    Period  Weeks      PT LONG TERM GOAL #4   Title  The patient will improve gait speed to > or equal to 2.6 ft/sec for return to full community ambulation without a device.    Baseline  6    Time  6    Period  Weeks      PT LONG TERM GOAL #5   Title  The patient will negotiate stairs in reciprocal pattern mod indep with one handrail.    Time  6    Period  Weeks            Plan - 07/23/19 1614    Clinical Impression Statement  Pt demonstrates some hip hiking with forward progression of LLE during gait and stairs; will continued to work on improved mechanics.  Pt reporting mild increase in pain during ROM exercises.  Observed pt to have pitting edema in Lt ankle; encouraged her to wear compression sock to decrease swelling.  Progressing towards all LTGs.    Rehab Potential  Good    PT Frequency  3x / week    PT Duration  6 weeks    PT Treatment/Interventions  ADLs/Self Care Home Management;Cryotherapy;Iontophoresis 4mg /ml Dexamethasone;Moist Heat;Neuromuscular re-education;Therapeutic exercise;Therapeutic activities;Gait training;Stair training;Functional mobility training;Patient/family  education;Taping;Vasopneumatic Device;Passive range of motion;Manual techniques    PT Next Visit Plan  continue progressive ROM / functional strengthening for Lt knee.    PT Home Exercise Plan  Access Code: [email or web address]    Consulted and Agree with Plan of Care  Patient       Patient will benefit from skilled therapeutic intervention in order to improve the following deficits and impairments:  Abnormal gait, Increased fascial restricitons, Decreased range of motion, Decreased strength, Decreased activity tolerance, Pain, Hypomobility, Impaired flexibility, Increased edema  Visit Diagnosis: Acute pain of left knee  Muscle weakness (generalized)  Other abnormalities of gait and mobility  Localized edema     Problem List Patient Active Problem List   Diagnosis Date Noted  . Status post left knee surgery 06/11/2019  . Primary osteoarthritis of first carpometacarpal joint of left hand 01/26/2019  . Abnormal MRI of head 10/03/2015  . Primary osteoarthritis of left knee 10/26/2013  . Headache 09/30/2013  . Obstructive sleep apnea 7cm water pressure CPAP 10/24/2010  . Rosacea 09/13/2010  . ALLERGIC RHINITIS CAUSE UNSPECIFIED 08/03/2008  . SPONDYLOLISTHESIS, ACQUIRED 05/21/2006  . SCOLIOSIS 12/31/2005  . SHOULDER/ARM SPRAIN/STRAIN, UNSPEC. 12/31/2005   03/02/2006, PTA 07/23/19 4:21 PM  [facility] [place] [address] [place], [place], [place] Phone: [phone]   Fax:  [phone]  Name: Briana Parks MRN: Annamarie Major Date of Birth: [birth year]

## 2019-07-26 ENCOUNTER — Other Ambulatory Visit: Payer: Self-pay

## 2019-07-26 ENCOUNTER — Encounter: Payer: Self-pay | Admitting: Physical Therapy

## 2019-07-26 ENCOUNTER — Ambulatory Visit (INDEPENDENT_AMBULATORY_CARE_PROVIDER_SITE_OTHER): Payer: BC Managed Care – PPO | Admitting: Physical Therapy

## 2019-07-26 DIAGNOSIS — M25562 Pain in left knee: Secondary | ICD-10-CM

## 2019-07-26 DIAGNOSIS — R600 Localized edema: Secondary | ICD-10-CM

## 2019-07-26 DIAGNOSIS — R2689 Other abnormalities of gait and mobility: Secondary | ICD-10-CM

## 2019-07-26 DIAGNOSIS — M6281 Muscle weakness (generalized): Secondary | ICD-10-CM

## 2019-07-26 NOTE — Therapy (Signed)
[place], [place], [address] Phone: [phone]   Fax:  [phone]  Physical Therapy Treatment  Patient Details  Name: Briana Parks MRN: [id number] Date of Birth: 03/19/1966 Referring Provider (PT): Dorna Leitz, MD   Encounter Date: 07/26/2019  PT End of Session - 07/26/19 1556    Visit Number  9    Number of Visits  18    Date for PT Re-Evaluation  08/16/19    PT Start Time  1518    PT Stop Time  1604    PT Time Calculation (min)  46 min    Behavior During Therapy  [facility] for tasks assessed/performed       Past Medical History:  Diagnosis Date  . ALLERGIC RHINITIS CAUSE UNSPECIFIED 08/03/2008   Qualifier: Diagnosis of  By: Esmeralda Arthur    . Arachnoid cyst   . Deviated septum    large turbinates  . Gestational diabetes   . Headache 09/30/2013  . 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 Kidney infection   . Low blood pressure   . Obstructive sleep apnea 7cm water pressure CPAP 10/24/2010   Moderate. Sleep study was performed at [facility] neurological clinic by Dr. Pecolia Ades on 10/02/2010. AHI was 19.8 with desaturations to 79%. Frequent severe snoring. Moderate wedge works associated with respiratory events. Difficulty initiating and maintaining sleep with no slow wave sleep. She had severe OSA and REM.   . Osteoarthritis of left knee 10/26/2013  . Plantar fasciitis   . Pneumonia    history of  . PONV (postoperative nausea and vomiting)    hard to wake up after anesthesia, did not get PONV after 2017 surgery at [facility]. notes care everywhere  . Recurrent UTI   . Rosacea 09/13/2010  . Scoliosis   . Tinnitus   . Vertigo     Past Surgical History:  Procedure Laterality Date  . bone spur removal Right 02/17/2012   foot  . bone spur removal Left 08/2015  . HERNIA REPAIR Bilateral   . KNEE ARTHROSCOPY Left 03/16/14   [facility]   . TONSILLECTOMY AND  ADENOIDECTOMY    . TOTAL KNEE ARTHROPLASTY Left 06/11/2019   Procedure: TOTAL KNEE ARTHROPLASTY;  Surgeon: Dorna Leitz, MD;  Location: [facility];  Service: Orthopedics;  Laterality: Left;  . TUBAL LIGATION      There were no vitals filed for this visit.  Subjective Assessment - 07/26/19 1628    Subjective  Pt reports having trouble sleeping through the night due to discomfort with the Lt knee. She states her pain is often around  1/10 or 2/10; no reported pain today due to medications. Scar massage/ mobs are going well per pt report.    Currently in Pain?  No/denies    Pain Score  0-No pain    Pain Location  Knee    Pain Orientation  Left    Pain Onset  1 to 4 weeks ago         [facility] PT Assessment - 07/26/19 0001      Assessment   Medical Diagnosis  L total knee arthroplasty    Referring Provider (PT)  Dorna Leitz, MD    Onset Date/Surgical Date  06/11/19    Next MD Visit  07/29/19    Prior Therapy  known to our clinic from prior therapy      PROM   Left Knee Extension  -15  Left Knee Flexion  93   standing knee flexion on top step       [facility] Adult PT Treatment/Exercise - 07/26/19 0001      Knee/Hip Exercises: Stretches   Lobbyist  Left;30 seconds;2 reps   prone with strap. noodle under thigh   Other Knee/Hip Stretches  seated scoot; 5 reps, 20 seconds      Knee/Hip Exercises: Aerobic   Recumbent Bike  partial revolutions for ROM x 6 min     Other Aerobic  single lap around gym (with RW) in between exercises to decrease stiffness.       Knee/Hip Exercises: Standing   Forward Step Up  Left;1 set;Hand Hold: 2;Step Height: 6";5 reps    Step Down  Right;1 set;5 reps;Hand Hold: 2;Step Height: 4"    Gait Training  Cane, Rt hand held, step through pattern on Rt. 320 ft.  VC's and visual demos needed for sequence, cane placement, step length and posture.  Gait quality improved with repetitive cues.      Knee/Hip Exercises: Prone   Hamstring Curl  2 sets;5 reps;5 seconds    1lb, 2nd set   Prone Knee Hang  2 minutes    Prone Knee Hang Weights (lbs)  1 lb, 2nd set      Vasopneumatic   Number Minutes Vasopneumatic   10 minutes    Vasopnuematic Location   Knee   Lt   Vasopneumatic Pressure  Medium    Vasopneumatic Temperature   34 deg      Manual Therapy   Manual therapy comments  I strip of sensitive Rock tape applied over Lt knee incision with 20% stretch in a zig-zag pattern for scar management         PT Long Term Goals - 07/26/19 1627      PT LONG TERM GOAL #1   Title  The patient will be indep with HEP.    Time  6    Period  Weeks    Status  On-going      PT LONG TERM GOAL #2   Title  The patient will report reduced functional limitation per FOTO from 70% to < or equal to 38%.    Time  6    Period  Weeks    Status  On-going      PT LONG TERM GOAL #3   Title  The patient will improve AROM R knee to 3-110 degrees.    Time  6    Period  Weeks    Status  On-going      PT LONG TERM GOAL #4   Title  The patient will improve gait speed to > or equal to 2.6 ft/sec for return to full community ambulation without a device.    Baseline  6    Time  6    Period  Weeks    Status  On-going      PT LONG TERM GOAL #5   Title  The patient will negotiate stairs in reciprocal pattern mod indep with one handrail.    Time  6    Period  Weeks    Status  On-going            Plan - 07/26/19 1615    Clinical Impression Statement  Pt demostrated some hip hiking during gait and stairs in forward progression; pt self corrected after single VC. Pt tolerated treatment well with no increased Lt knee pain during exercises. Gait quality with SPC improved;  encouraged pt to d/c RW and use SPC for community distances. Pt is progressing towards all goals.    Personal Factors and Comorbidities  Comorbidity 1    Comorbidities  h/o prior L knee surgery    Examination-Activity Limitations  Squat;Stairs;Stand;Locomotion Level;Sleep    Examination-Participation  Restrictions  Community Activity;Yard Work    Stability/Clinical Decision Making  Stable/Uncomplicated    Rehab Potential  Good    PT Frequency  3x / week    PT Duration  6 weeks    PT Treatment/Interventions  ADLs/Self Care Home Management;Cryotherapy;Iontophoresis 4mg /ml Dexamethasone;Moist Heat;Neuromuscular re-education;Therapeutic exercise;Therapeutic activities;Gait training;Stair training;Functional mobility training;Patient/family education;Taping;Vasopneumatic Device;Passive range of motion;Manual techniques    PT Next Visit Plan  continue progressive ROM / functional strengthening for Lt knee. MD note next visit.    PT Home Exercise Plan  Access Code: [email or web address]    Consulted and Agree with Plan of Care  Patient       Patient will benefit from skilled therapeutic intervention in order to improve the following deficits and impairments:  Abnormal gait, Increased fascial restricitons, Decreased range of motion, Decreased strength, Decreased activity tolerance, Pain, Hypomobility, Impaired flexibility, Increased edema  Visit Diagnosis: Acute pain of left knee  Muscle weakness (generalized)  Other abnormalities of gait and mobility  Localized edema     Problem List Patient Active Problem List   Diagnosis Date Noted  . Status post left knee surgery 06/11/2019  . Primary osteoarthritis of first carpometacarpal joint of left hand 01/26/2019  . Abnormal MRI of head 10/03/2015  . Primary osteoarthritis of left knee 10/26/2013  . Headache 09/30/2013  . Obstructive sleep apnea 7cm water pressure CPAP 10/24/2010  . Rosacea 09/13/2010  . ALLERGIC RHINITIS CAUSE UNSPECIFIED 08/03/2008  . SPONDYLOLISTHESIS, ACQUIRED 05/21/2006  . SCOLIOSIS 12/31/2005  . SHOULDER/ARM SPRAIN/STRAIN, UNSPEC. 12/31/2005     03/02/2006, SPTA 07/26/19 4:33 PM  This entire session was performed under direct supervision and direction of a licensed Physical 09/25/19. I have personally read,  edited and approved of the note as written.  Environmental health practitioner, PTA 07/26/19 4:53 PM   [facility] [place] [address] [place], [place], [place] Phone: [phone]   Fax:  [phone]  Name: Briana Parks MRN: Annamarie Major Date of Birth: 1966-02-15

## 2019-07-28 ENCOUNTER — Ambulatory Visit (INDEPENDENT_AMBULATORY_CARE_PROVIDER_SITE_OTHER): Payer: BC Managed Care – PPO | Admitting: Physical Therapy

## 2019-07-28 ENCOUNTER — Other Ambulatory Visit: Payer: Self-pay

## 2019-07-28 DIAGNOSIS — M25562 Pain in left knee: Secondary | ICD-10-CM

## 2019-07-28 DIAGNOSIS — M6281 Muscle weakness (generalized): Secondary | ICD-10-CM | POA: Diagnosis not present

## 2019-07-28 DIAGNOSIS — R600 Localized edema: Secondary | ICD-10-CM

## 2019-07-28 DIAGNOSIS — R2689 Other abnormalities of gait and mobility: Secondary | ICD-10-CM | POA: Diagnosis not present

## 2019-07-28 NOTE — Therapy (Signed)
[place], [place], [address] Phone: [phone]   Fax:  [phone]  Physical Therapy Treatment  Patient Details  Name: Briana Parks MRN: [id number] Date of Birth: 04/04/1965 Referring Provider (PT): Dorna Leitz, MD   Encounter Date: 07/28/2019  PT End of Session - 07/28/19 1518    Visit Number  10    Number of Visits  18    Date for PT Re-Evaluation  08/16/19    PT Start Time  2957    PT Stop Time  1604    PT Time Calculation (min)  49 min    Behavior During Therapy  [facility] for tasks assessed/performed       Past Medical History:  Diagnosis Date  . ALLERGIC RHINITIS CAUSE UNSPECIFIED 08/03/2008   Qualifier: Diagnosis of  By: Esmeralda Arthur    . Arachnoid cyst   . Deviated septum    large turbinates  . Gestational diabetes   . Headache 09/30/2013  . 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 Kidney infection   . Low blood pressure   . Obstructive sleep apnea 7cm water pressure CPAP 10/24/2010   Moderate. Sleep study was performed at [facility] neurological clinic by Dr. Pecolia Ades on 10/02/2010. AHI was 19.8 with desaturations to 79%. Frequent severe snoring. Moderate wedge works associated with respiratory events. Difficulty initiating and maintaining sleep with no slow wave sleep. She had severe OSA and REM.   . Osteoarthritis of left knee 10/26/2013  . Plantar fasciitis   . Pneumonia    history of  . PONV (postoperative nausea and vomiting)    hard to wake up after anesthesia, did not get PONV after 2017 surgery at [facility]. notes care everywhere  . Recurrent UTI   . Rosacea 09/13/2010  . Scoliosis   . Tinnitus   . Vertigo     Past Surgical History:  Procedure Laterality Date  . bone spur removal Right 02/17/2012   foot  . bone spur removal Left 08/2015  . HERNIA REPAIR Bilateral   . KNEE ARTHROSCOPY Left 03/16/14   [facility]   . TONSILLECTOMY AND  ADENOIDECTOMY    . TOTAL KNEE ARTHROPLASTY Left 06/11/2019   Procedure: TOTAL KNEE ARTHROPLASTY;  Surgeon: Dorna Leitz, MD;  Location: [facility];  Service: Orthopedics;  Laterality: Left;  . TUBAL LIGATION      There were no vitals filed for this visit.  Subjective Assessment - 07/28/19 1521    Subjective  Pt states that she has been using her floor bike. Sleeping has improved over the last 2 nights. She is only taking muscle relaxer and pain pills before therapy only. Scar massage and HEP are going well per pt statement.    Currently in Pain?  No/denies    Pain Score  0-No pain    Pain Location  Knee    Pain Orientation  Left    Pain Onset  1 to 4 weeks ago         [facility] PT Assessment - 07/28/19 0001      Assessment   Medical Diagnosis  L total knee arthroplasty    Referring Provider (PT)  Dorna Leitz, MD    Onset Date/Surgical Date  06/11/19    Next MD Visit  07/29/19    Prior Therapy  known to our clinic from prior therapy      Observation/Other Assessments   Focus on Therapeutic Outcomes (FOTO)  37% limited       PROM   Left Knee Extension  -6   supine   Left Knee Flexion  89   in seated      [facility] Adult PT Treatment/Exercise - 07/28/19 0001      Knee/Hip Exercises: Stretches   Other Knee/Hip Stretches  seated scoot; 5 reps, 20 seconds; knee ext/hamstring stretch, x 5, 20 seconds   between squat sets     Knee/Hip Exercises: Aerobic   Recumbent Bike  partial revolutions for ROM x 6 min       Knee/Hip Exercises: Standing   Forward Step Up  Left;1 set;Hand Hold: 2;Step Height: 4";Step Height: 6";10 reps   10 reps each,    Step Down  Right;1 set;5 reps;Hand Hold: 2;Step Height: 4" - cues to avoid hip hike   Functional Squat  2 sets;10 reps   at sink to chair, VCs and tactile cues for form   Other Standing Knee Exercises  laps around gym to prevent stiffness x 2   cues for use of cane     Knee/Hip Exercises: Prone   Hamstring Curl  2 sets;5 reps;5 seconds   1lb,  2nd set   Prone Knee Hang  2 minutes    Prone Knee Hang Weights (lbs)  2 lb cuff weight      Vasopneumatic   Number Minutes Vasopneumatic   10 minutes    Vasopnuematic Location   Knee   Lt   Vasopneumatic Pressure  Medium    Vasopneumatic Temperature   34 deg      Manual Therapy   Manual therapy comments  I strip of sensitive Rock tape applied over Lt knee incision with 20% stretch in a zig-zag pattern for scar management                  PT Long Term Goals - 07/28/19 1648      PT LONG TERM GOAL #1   Title  The patient will be indep with HEP.    Time  6    Period  Weeks    Status  On-going      PT LONG TERM GOAL #2   Title  The patient will report reduced functional limitation per FOTO from 70% to < or equal to 38%.    Baseline  37% limited (07/28/19)    Time  6    Period  Weeks    Status  Achieved      PT LONG TERM GOAL #3   Title  The patient will improve AROM R knee to 3-110 degrees.    Time  6    Period  Weeks    Status  On-going      PT LONG TERM GOAL #4   Title  The patient will improve gait speed to > or equal to 2.6 ft/sec for return to full community ambulation without a device.    Baseline  6    Time  6    Period  Weeks    Status  On-going      PT LONG TERM GOAL #5   Title  The patient will negotiate stairs in reciprocal pattern mod indep with one handrail.    Time  6    Period  Weeks    Status  On-going            Plan - 07/28/19 1649    Clinical Impression Statement  Pt demonstrated hip hiking on Lt with stairs and squats;  improved form with tactile cues.Pt had no increase in Lt knee pain during therapy. She had made significant progress in Lt knee ext; -15 deg to -6 deg. She has met he FOTO goal with 37% limitations; all other goals on-going.    Personal Factors and Comorbidities  Comorbidity 1    Comorbidities  h/o prior L knee surgery    Examination-Activity Limitations  Squat;Stairs;Stand;Locomotion Level;Sleep     Examination-Participation Restrictions  Community Activity;Yard Work    Stability/Clinical Decision Making  Stable/Uncomplicated    Rehab Potential  Good    PT Frequency  3x / week    PT Duration  6 weeks    PT Treatment/Interventions  ADLs/Self Care Home Management;Cryotherapy;Iontophoresis '4mg'$ /ml Dexamethasone;Moist Heat;Neuromuscular re-education;Therapeutic exercise;Therapeutic activities;Gait training;Stair training;Functional mobility training;Patient/family education;Taping;Vasopneumatic Device;Passive range of motion;Manual techniques    PT Next Visit Plan  continue progressive ROM / functional strengthening for Lt knee.    PT Home Exercise Plan  Access Code: [email or web address]       Patient will benefit from skilled therapeutic intervention in order to improve the following deficits and impairments:  Abnormal gait, Increased fascial restricitons, Decreased range of motion, Decreased strength, Decreased activity tolerance, Pain, Hypomobility, Impaired flexibility, Increased edema  Visit Diagnosis: Acute pain of left knee  Muscle weakness (generalized)  Other abnormalities of gait and mobility  Localized edema     Problem List Patient Active Problem List   Diagnosis Date Noted  . Status post left knee surgery 06/11/2019  . Primary osteoarthritis of first carpometacarpal joint of left hand 01/26/2019  . Abnormal MRI of head 10/03/2015  . Primary osteoarthritis of left knee 10/26/2013  . Headache 09/30/2013  . Obstructive sleep apnea 7cm water pressure CPAP 10/24/2010  . Rosacea 09/13/2010  . ALLERGIC RHINITIS CAUSE UNSPECIFIED 08/03/2008  . SPONDYLOLISTHESIS, ACQUIRED 05/21/2006  . SCOLIOSIS 12/31/2005  . SHOULDER/ARM SPRAIN/STRAIN, UNSPEC. 12/31/2005    Ronaldo Miyamoto, SPTA 07/28/19 4:54 PM  This entire session was performed under direct supervision and direction of a licensed Physical Brewing technologist. I have personally read, edited and approved of the note as  written.  Kerin Perna, PTA 07/28/19 5:07 PM   [place], [place], [address] Phone: [phone]   Fax:  [phone]  Name: Briana Parks MRN: [id number] Date of Birth: 04-18-1965

## 2019-07-30 ENCOUNTER — Encounter: Payer: Self-pay | Admitting: Physical Therapy

## 2019-07-30 ENCOUNTER — Other Ambulatory Visit: Payer: Self-pay

## 2019-07-30 ENCOUNTER — Ambulatory Visit (INDEPENDENT_AMBULATORY_CARE_PROVIDER_SITE_OTHER): Payer: BC Managed Care – PPO | Admitting: Physical Therapy

## 2019-07-30 DIAGNOSIS — M6281 Muscle weakness (generalized): Secondary | ICD-10-CM | POA: Diagnosis not present

## 2019-07-30 DIAGNOSIS — R2689 Other abnormalities of gait and mobility: Secondary | ICD-10-CM | POA: Diagnosis not present

## 2019-07-30 DIAGNOSIS — R600 Localized edema: Secondary | ICD-10-CM

## 2019-07-30 DIAGNOSIS — M25562 Pain in left knee: Secondary | ICD-10-CM | POA: Diagnosis not present

## 2019-07-30 NOTE — Therapy (Signed)
[facility] [place] [address] [place], [place], [address] Phone: [phone]   Fax:  [phone]  Physical Therapy Treatment  Patient Details  Name: Briana Parks MRN: [id number] Date of Birth: [birth year] Referring Provider (PT): Briana Geralds, MD   Encounter Date: 07/30/2019  PT End of Session - 07/30/19 1526    Visit Number  11    Number of Visits  18    Date for PT Re-Evaluation  08/16/19    PT Start Time  1526    PT Stop Time  1617    PT Time Calculation (min)  51 min    Activity Tolerance  Patient tolerated treatment well    Behavior During Therapy  [facility] for tasks assessed/performed       Past Medical History:  Diagnosis Date  . ALLERGIC RHINITIS CAUSE UNSPECIFIED 08/03/2008   Qualifier: Diagnosis of  By: Thomos Lemons    . Arachnoid cyst   . Deviated septum    large turbinates  . Gestational diabetes   . Headache 09/30/2013  . 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 Kidney infection   . Low blood pressure   . Obstructive sleep apnea 7cm water pressure CPAP 10/24/2010   Moderate. Sleep study was performed at [facility] neurological clinic by Dr. Marliss Coots on 10/02/2010. AHI was 19.8 with desaturations to 79%. Frequent severe snoring. Moderate wedge works associated with respiratory events. Difficulty initiating and maintaining sleep with no slow wave sleep. She had severe OSA and REM.   . Osteoarthritis of left knee 10/26/2013  . Plantar fasciitis   . Pneumonia    history of  . PONV (postoperative nausea and vomiting)    hard to wake up after anesthesia, did not get PONV after 2017 surgery at [facility]. notes care everywhere  . Recurrent UTI   . Rosacea 09/13/2010  . Scoliosis   . Tinnitus   . Vertigo     Past Surgical History:  Procedure Laterality Date  . bone spur removal Right 02/17/2012   foot  . bone spur removal Left 08/2015  . HERNIA REPAIR Bilateral   . KNEE ARTHROSCOPY Left 03/16/14   [facility]   . TONSILLECTOMY AND ADENOIDECTOMY    . TOTAL KNEE ARTHROPLASTY Left 06/11/2019   Procedure: TOTAL KNEE ARTHROPLASTY;  Surgeon: Briana Geralds, MD;  Location: [facility];  Service: Orthopedics;  Laterality: Left;  . TUBAL LIGATION      There were no vitals filed for this visit.  Subjective Assessment - 07/30/19 1528    Subjective  [name] states that the doctor gave her medication to sleep and she slept from 10:30 pm to 6 am with no waking from pain. HEP and ambulation with the [facility] are going well. She reports making an effort to keep proper alignment in Lt knee and hip when doing chores at home.    Patient Stated Goals  Walking without a device without pain.    Currently in Pain?  No/denies    Pain Score  0-No pain    Pain Location  Knee    Pain Orientation  Left         [facility] PT Assessment - 07/30/19 0001      Assessment   Medical Diagnosis  L total knee arthroplasty    Referring Provider (PT)  Briana Geralds, MD    Onset Date/Surgical Date  06/11/19    Next MD Visit  07/29/19    Prior Therapy  known to our clinic from prior therapy      PROM   Left Knee Flexion  94   foot on 12" step lunging forward.       [facility] Adult PT Treatment/Exercise - 07/30/19 0001      Knee/Hip Exercises: Stretches   Passive Hamstring Stretch  Left;3 reps;30 seconds   standing with heel on 12" step.    Quad Stretch  Left;Right;30 seconds;2 reps   Prone with big green noodle above knee.    Hip Flexor Stretch  Left;1 rep;20 seconds   trial of Thomas position and Obers; limited stretch felt.    Knee: Self-Stretch to increase Flexion  Left;3 reps;30 seconds   10 reps; lunging forward on 12" step with BUE support @ rail   Knee: Self-Stretch Limitations  also, foot on 24" step x 10 sec x 3 reps      Knee/Hip Exercises: Aerobic   Recumbent Bike  partial revolutions for ROM x 5 min       Knee/Hip Exercises: Standing   Terminal Knee Extension  Strengthening;Left;1 set;10  reps;Theraband    Theraband Level (Terminal Knee Extension)  Level 3 (Green)    Forward Step Up  Left;1 set;10 reps;Hand Hold: 2;Step Height: 6"    Functional Squat  10 reps;2 sets   at sink to chair, VCs and tactile cues for form     Vasopneumatic   Number Minutes Vasopneumatic   10 minutes    Vasopnuematic Location   Knee   Lt   Vasopneumatic Pressure  Medium    Vasopneumatic Temperature   34 deg      Manual Therapy   Manual therapy comments  I strip of sensitive Rock tape applied over Lt knee incision with 20% stretch in a zig-zag pattern for scar management    Passive ROM  into Lt knee ext x 15 sec x 3 reps                   PT Long Term Goals - 07/28/19 1648      PT LONG TERM GOAL #1   Title  The patient will be indep with HEP.    Time  6    Period  Weeks    Status  On-going      PT LONG TERM GOAL #2   Title  The patient will report reduced functional limitation per FOTO from 70% to < or equal to 38%.    Baseline  37% limited (07/28/19)    Time  6    Period  Weeks    Status  Achieved      PT LONG TERM GOAL #3   Title  The patient will improve AROM R knee to 3-110 degrees.    Time  6    Period  Weeks    Status  On-going      PT LONG TERM GOAL #4   Title  The patient will improve gait speed to > or equal to 2.6 ft/sec for return to full community ambulation without a device.    Baseline  6    Time  6    Period  Weeks    Status  On-going      PT LONG TERM GOAL #5   Title  The patient will negotiate stairs in reciprocal pattern mod indep with one handrail.    Time  6    Period  Weeks    Status  On-going  Plan - 07/30/19 1621    Clinical Impression Statement  Pt demonstrated increased self-awareness/improved self-correction with form during exercises today; especially with squats. She has continued to push herself in therapy to achieve her ROM goals; successfully got Lt foot up to surface above hip height for Lt knee ROM exercise. No  increase in Lt knee pain in session today. Progressing well towards all goals.    Personal Factors and Comorbidities  Comorbidity 1    Comorbidities  h/o prior L knee surgery    Examination-Activity Limitations  Squat;Stairs;Stand;Locomotion Level;Sleep    Examination-Participation Restrictions  Community Activity;Yard Work    Stability/Clinical Decision Making  Stable/Uncomplicated    Rehab Potential  Good    PT Frequency  3x / week    PT Duration  6 weeks    PT Treatment/Interventions  ADLs/Self Care Home Management;Cryotherapy;Iontophoresis 4mg /ml Dexamethasone;Moist Heat;Neuromuscular re-education;Therapeutic exercise;Therapeutic activities;Gait training;Stair training;Functional mobility training;Patient/family education;Taping;Vasopneumatic Device;Passive range of motion;Manual techniques    PT Next Visit Plan  continue progressive ROM / functional strengthening for Lt knee.    PT Home Exercise Plan  Access Code: [email or web address]    Consulted and Agree with Plan of Care  Patient       Patient will benefit from skilled therapeutic intervention in order to improve the following deficits and impairments:  Abnormal gait, Increased fascial restricitons, Decreased range of motion, Decreased strength, Decreased activity tolerance, Pain, Hypomobility, Impaired flexibility, Increased edema  Visit Diagnosis: Acute pain of left knee  Muscle weakness (generalized)  Other abnormalities of gait and mobility  Localized edema     Problem List Patient Active Problem List   Diagnosis Date Noted  . Status post left knee surgery 06/11/2019  . Primary osteoarthritis of first carpometacarpal joint of left hand 01/26/2019  . Abnormal MRI of head 10/03/2015  . Primary osteoarthritis of left knee 10/26/2013  . Headache 09/30/2013  . Obstructive sleep apnea 7cm water pressure CPAP 10/24/2010  . Rosacea 09/13/2010  . ALLERGIC RHINITIS CAUSE UNSPECIFIED 08/03/2008  . SPONDYLOLISTHESIS, ACQUIRED 05/21/2006   . SCOLIOSIS 12/31/2005  . SHOULDER/ARM SPRAIN/STRAIN, UNSPEC. 12/31/2005    Ronaldo Miyamoto, SPTA 07/30/19 4:29 PM  This entire session was performed under direct supervision and direction of a licensed Physical Brewing technologist. I have personally read, edited and approved of the note as written.  Kerin Perna, PTA 07/30/19 4:37 PM    [place] [address] [place], [place], [address] Phone: [phone]   Fax:  [phone]  Name: Briana Parks MRN: [id number] Date of Birth: 03-25-1966

## 2019-08-02 ENCOUNTER — Encounter: Payer: Self-pay | Admitting: Physical Therapy

## 2019-08-02 ENCOUNTER — Ambulatory Visit (INDEPENDENT_AMBULATORY_CARE_PROVIDER_SITE_OTHER): Payer: BC Managed Care – PPO | Admitting: Physical Therapy

## 2019-08-02 ENCOUNTER — Other Ambulatory Visit: Payer: Self-pay

## 2019-08-02 DIAGNOSIS — R2689 Other abnormalities of gait and mobility: Secondary | ICD-10-CM

## 2019-08-02 DIAGNOSIS — R600 Localized edema: Secondary | ICD-10-CM

## 2019-08-02 DIAGNOSIS — M25562 Pain in left knee: Secondary | ICD-10-CM

## 2019-08-02 DIAGNOSIS — M6281 Muscle weakness (generalized): Secondary | ICD-10-CM

## 2019-08-02 NOTE — Therapy (Signed)
[facility] [place] [address] [place], [place], [address] Phone: [phone]   Fax:  [phone]  Physical Therapy Treatment  Patient Details  Name: Briana Parks MRN: [id number] Date of Birth: [birth year] Referring Provider (PT): Jodi Geralds, MD   Encounter Date: 08/02/2019  PT End of Session - 08/02/19 1521    Visit Number  12    Number of Visits  18    Date for PT Re-Evaluation  08/16/19    PT Start Time  1515    PT Stop Time  1605    PT Time Calculation (min)  50 min    Activity Tolerance  Patient tolerated treatment well    Behavior During Therapy  [facility] for tasks assessed/performed       Past Medical History:  Diagnosis Date  . ALLERGIC RHINITIS CAUSE UNSPECIFIED 08/03/2008   Qualifier: Diagnosis of  By: Thomos Lemons    . Arachnoid cyst   . Deviated septum    large turbinates  . Gestational diabetes   . Headache 09/30/2013  . 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 Kidney infection   . Low blood pressure   . Obstructive sleep apnea 7cm water pressure CPAP 10/24/2010   Moderate. Sleep study was performed at [facility] neurological clinic by Dr. Marliss Coots on 10/02/2010. AHI was 19.8 with desaturations to 79%. Frequent severe snoring. Moderate wedge works associated with respiratory events. Difficulty initiating and maintaining sleep with no slow wave sleep. She had severe OSA and REM.   . Osteoarthritis of left knee 10/26/2013  . Plantar fasciitis   . Pneumonia    history of  . PONV (postoperative nausea and vomiting)    hard to wake up after anesthesia, did not get PONV after 2017 surgery at [facility]. notes care everywhere  . Recurrent UTI   . Rosacea 09/13/2010  . Scoliosis   . Tinnitus   . Vertigo     Past Surgical History:  Procedure Laterality Date  . bone spur removal Right 02/17/2012   foot  . bone spur removal Left 08/2015  . HERNIA REPAIR Bilateral   . KNEE ARTHROSCOPY Left 03/16/14    [facility]   . TONSILLECTOMY AND ADENOIDECTOMY    . TOTAL KNEE ARTHROPLASTY Left 06/11/2019   Procedure: TOTAL KNEE ARTHROPLASTY;  Surgeon: Jodi Geralds, MD;  Location: [facility];  Service: Orthopedics;  Laterality: Left;  . TUBAL LIGATION      There were no vitals filed for this visit.  Subjective Assessment - 08/02/19 1530    Subjective  Pt reports no new changes except now she is only taking Tramadol prior to therapy, no longer taking muscle relaxers.    Patient Stated Goals  Walking without a device without pain.    Currently in Pain?  No/denies    Pain Score  0-No pain         [facility] PT Assessment - 08/02/19 0001      Assessment   Medical Diagnosis  L total knee arthroplasty    Referring Provider (PT)  Jodi Geralds, MD    Onset Date/Surgical Date  06/11/19    Next MD Visit  09/02/19    Prior Therapy  known to our clinic from prior therapy       [facility] Adult PT Treatment/Exercise - 08/02/19 0001      Ambulation/Gait   Ambulation/Gait Assistance  6: Modified independent (Device/Increase time)    Ambulation Distance (Feet)  160 Feet  Assistive device  Straight cane    Gait Pattern  Decreased arm swing - left;Step-through pattern;Decreased stance time - left;Decreased weight shift to left;Left flexed knee in stance;Wide base of support;Decreased dorsiflexion - left    Pre-Gait Activities  weight shifts into Lt terminal knee ext x 8 reps with tacile cues.   Tandem walking (forward x 20 ft x 4 reps) to work on narrow BOS with SPC.       Knee/Hip Exercises: Stretches   Passive Hamstring Stretch  Left;4 reps;20 seconds   standing with heel on 12" step.    Quad Stretch  Left;20 seconds;3 reps    Knee: Self-Stretch to increase Flexion  Left;30 seconds;4 reps   lunging forward on 12" step with BUE support @ rail   Knee: Self-Stretch Limitations  also, foot on 24" step x 15 sec x 2 reps    Gastroc Stretch  Both;2 reps;20 seconds      Knee/Hip Exercises: Aerobic    Recumbent Bike  partial revolutions for ROM x 5 min       Knee/Hip Exercises: Standing   Forward Step Up  Left;1 set;10 reps;Hand Hold: 2;Step Height: 6"    Other Standing Knee Exercises  Lt / Rt toe taps to 6" step to correct Lt hip hiking, x 10 reps, with mirror for feedback.       Knee/Hip Exercises: Seated   Sit to Sand  1 set;10 reps;with UE support   eccentric, low mat, Lt foot back for flexion stretch     Knee/Hip Exercises: Supine   Quad Sets  Strengthening;Left;1 set    Quad Sets Limitations  bilat for visual cue in long sitting    Straight Leg Raises  Strengthening;Left;1 set;5 reps   extensor lag   Knee Extension  PROM;Left;5 reps      Knee/Hip Exercises: Prone   Other Prone Exercises  quad set in prone x 5 sec hold x 8 reps      Vasopneumatic   Number Minutes Vasopneumatic   10 minutes    Vasopnuematic Location   Knee   Lt   Vasopneumatic Pressure  Medium    Vasopneumatic Temperature   34 deg                  PT Long Term Goals - 07/28/19 1648      PT LONG TERM GOAL #1   Title  The patient will be indep with HEP.    Time  6    Period  Weeks    Status  On-going      PT LONG TERM GOAL #2   Title  The patient will report reduced functional limitation per FOTO from 70% to < or equal to 38%.    Baseline  37% limited (07/28/19)    Time  6    Period  Weeks    Status  Achieved      PT LONG TERM GOAL #3   Title  The patient will improve AROM R knee to 3-110 degrees.    Time  6    Period  Weeks    Status  On-going      PT LONG TERM GOAL #4   Title  The patient will improve gait speed to > or equal to 2.6 ft/sec for return to full community ambulation without a device.    Baseline  6    Time  6    Period  Weeks    Status  On-going  PT LONG TERM GOAL #5   Title  The patient will negotiate stairs in reciprocal pattern mod indep with one handrail.    Time  6    Period  Weeks    Status  On-going            Plan - 08/02/19 1627     Clinical Impression Statement  Pt able to complete 2 retro revolutions on recumbent bicycle today (with great effort).  She has a poor quad contraction with supine quad set; encouraged her to continue working on this at home.  Gait quality improving with cues.  Pt progressing gradually towards all goals.    Comorbidities  h/o prior L knee surgery    Examination-Activity Limitations  Squat;Stairs;Stand;Locomotion Level;Sleep    Examination-Participation Restrictions  Community Activity;Yard Work    Stability/Clinical Decision Making  Stable/Uncomplicated    Rehab Potential  Good    PT Frequency  3x / week    PT Duration  6 weeks    PT Treatment/Interventions  ADLs/Self Care Home Management;Cryotherapy;Iontophoresis 4mg /ml Dexamethasone;Moist Heat;Neuromuscular re-education;Therapeutic exercise;Therapeutic activities;Gait training;Stair training;Functional mobility training;Patient/family education;Taping;Vasopneumatic Device;Passive range of motion;Manual techniques    PT Next Visit Plan  continue progressive ROM / functional strengthening for Lt knee.    PT Home Exercise Plan  Access Code: [email or web address]    Consulted and Agree with Plan of Care  Patient       Patient will benefit from skilled therapeutic intervention in order to improve the following deficits and impairments:  Abnormal gait, Increased fascial restricitons, Decreased range of motion, Decreased strength, Decreased activity tolerance, Pain, Hypomobility, Impaired flexibility, Increased edema  Visit Diagnosis: Acute pain of left knee  Muscle weakness (generalized)  Other abnormalities of gait and mobility  Localized edema     Problem List Patient Active Problem List   Diagnosis Date Noted  . Status post left knee surgery 06/11/2019  . Primary osteoarthritis of first carpometacarpal joint of left hand 01/26/2019  . Abnormal MRI of head 10/03/2015  . Primary osteoarthritis of left knee 10/26/2013  . Headache 09/30/2013  .  Obstructive sleep apnea 7cm water pressure CPAP 10/24/2010  . Rosacea 09/13/2010  . ALLERGIC RHINITIS CAUSE UNSPECIFIED 08/03/2008  . SPONDYLOLISTHESIS, ACQUIRED 05/21/2006  . SCOLIOSIS 12/31/2005  . SHOULDER/ARM SPRAIN/STRAIN, UNSPEC. 12/31/2005   03/02/2006, PTA 08/02/19 4:29 PM  [facility] [place] [address] [place], [place], [place] Phone: [phone]   Fax:  [phone]  Name: Briana Parks MRN: Annamarie Major Date of Birth: [birth year]

## 2019-08-04 ENCOUNTER — Encounter: Payer: Self-pay | Admitting: Physical Therapy

## 2019-08-04 ENCOUNTER — Other Ambulatory Visit: Payer: Self-pay

## 2019-08-04 ENCOUNTER — Ambulatory Visit (INDEPENDENT_AMBULATORY_CARE_PROVIDER_SITE_OTHER): Payer: BC Managed Care – PPO | Admitting: Physical Therapy

## 2019-08-04 DIAGNOSIS — R2689 Other abnormalities of gait and mobility: Secondary | ICD-10-CM | POA: Diagnosis not present

## 2019-08-04 DIAGNOSIS — M25562 Pain in left knee: Secondary | ICD-10-CM | POA: Diagnosis not present

## 2019-08-04 DIAGNOSIS — R600 Localized edema: Secondary | ICD-10-CM

## 2019-08-04 DIAGNOSIS — M6281 Muscle weakness (generalized): Secondary | ICD-10-CM

## 2019-08-04 NOTE — Therapy (Signed)
[facility] [place] [address] [place], [place], [address] Phone: [phone]   Fax:  [phone]  Physical Therapy Treatment  Patient Details  Name: Briana Parks MRN: [id number] Date of Birth: 01/06/1966 Referring Provider (PT): Jodi Geralds, MD   Encounter Date: 08/04/2019  PT End of Session - 08/04/19 1150    Visit Number  13    Number of Visits  18    Date for PT Re-Evaluation  08/16/19    PT Start Time  1145    PT Stop Time  1236    PT Time Calculation (min)  51 min    Activity Tolerance  Patient tolerated treatment well    Behavior During Therapy  [facility] for tasks assessed/performed       Past Medical History:  Diagnosis Date  . ALLERGIC RHINITIS CAUSE UNSPECIFIED 08/03/2008   Qualifier: Diagnosis of  By: Thomos Lemons    . Arachnoid cyst   . Deviated septum    large turbinates  . Gestational diabetes   . Headache 09/30/2013  . 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 Kidney infection   . Low blood pressure   . Obstructive sleep apnea 7cm water pressure CPAP 10/24/2010   Moderate. Sleep study was performed at [facility] neurological clinic by Dr. Marliss Coots on 10/02/2010. AHI was 19.8 with desaturations to 79%. Frequent severe snoring. Moderate wedge works associated with respiratory events. Difficulty initiating and maintaining sleep with no slow wave sleep. She had severe OSA and REM.   . Osteoarthritis of left knee 10/26/2013  . Plantar fasciitis   . Pneumonia    history of  . PONV (postoperative nausea and vomiting)    hard to wake up after anesthesia, did not get PONV after 2017 surgery at [facility]. notes care everywhere  . Recurrent UTI   . Rosacea 09/13/2010  . Scoliosis   . Tinnitus   . Vertigo     Past Surgical History:  Procedure Laterality Date  . bone spur removal Right 02/17/2012   foot  . bone spur removal Left 08/2015  . HERNIA REPAIR Bilateral   . KNEE ARTHROSCOPY Left 03/16/14    [facility]   . TONSILLECTOMY AND ADENOIDECTOMY    . TOTAL KNEE ARTHROPLASTY Left 06/11/2019   Procedure: TOTAL KNEE ARTHROPLASTY;  Surgeon: Jodi Geralds, MD;  Location: [facility];  Service: Orthopedics;  Laterality: Left;  . TUBAL LIGATION      There were no vitals filed for this visit.  Subjective Assessment - 08/04/19 1152    Subjective  "The tender spots (in incision) aren't tender anymore.  No issues without muscle relaxer.   She rode her stationary bike for 5 min, stepper for 2 yesterday.    Patient Stated Goals  Walking without a device without pain.    Currently in Pain?  No/denies    Pain Score  0-No pain         [facility] PT Assessment - 08/04/19 0001      Assessment   Medical Diagnosis  L total knee arthroplasty    Referring Provider (PT)  Jodi Geralds, MD    Onset Date/Surgical Date  06/11/19    Next MD Visit  09/02/19    Prior Therapy  known to our clinic from prior therapy       [facility] Adult PT Treatment/Exercise - 08/04/19 0001      Ambulation/Gait   Ambulation/Gait Assistance  6: Modified independent (Device/Increase time)  Ambulation Distance (Feet)  160 Feet    Assistive device  Straight cane    Gait Pattern  Decreased arm swing - left;Step-through pattern;Decreased stance time - left;Decreased weight shift to left;Left flexed knee in stance;Wide base of support;Decreased dorsiflexion - left      Knee/Hip Exercises: Stretches   Passive Hamstring Stretch  Left;20 seconds;3 reps    Hip Flexor Stretch  Left;2 reps;30 seconds   Thomas position   Knee: Self-Stretch Limitations   foot on chair seat x 15 sec x 4 reps    Gastroc Stretch  Both;2 reps;30 seconds   incline board     Knee/Hip Exercises: Aerobic   Recumbent Bike  partial revolutions for ROM x 5 min, 2 min at end of session      Other Aerobic  single lap around gym (with SPC- 80 ft) in between exercises to decrease stiffness.       Knee/Hip Exercises: Standing   Forward Step Up  Left;1  set;10 reps;Hand Hold: 2;Step Height: 6"    Step Down  Right;1 set;10 reps;Hand Hold: 2;Step Height: 4"    Stairs  10 steps with reciprocal pattern (6") with single rail; cues for not hiking Lt hip.  Pt descends with Rt toes and stays in PF until       Knee/Hip Exercises: Seated   Other Seated Knee/Hip Exercises  seated scoots for increased Lt knee flexion ROM x 5 reps, 10 sec hold.     Sit to Sand  1 set;10 reps;with UE support   eccentric, low mat, Lt foot back for flexion stretch     Vasopneumatic   Number Minutes Vasopneumatic   10 minutes    Vasopnuematic Location   Knee   Lt   Vasopneumatic Pressure  Medium    Vasopneumatic Temperature   34 deg                  PT Long Term Goals - 07/28/19 1648      PT LONG TERM GOAL #1   Title  The patient will be indep with HEP.    Time  6    Period  Weeks    Status  On-going      PT LONG TERM GOAL #2   Title  The patient will report reduced functional limitation per FOTO from 70% to < or equal to 38%.    Baseline  37% limited (07/28/19)    Time  6    Period  Weeks    Status  Achieved      PT LONG TERM GOAL #3   Title  The patient will improve AROM R knee to 3-110 degrees.    Time  6    Period  Weeks    Status  On-going      PT LONG TERM GOAL #4   Title  The patient will improve gait speed to > or equal to 2.6 ft/sec for return to full community ambulation without a device.    Baseline  6    Time  6    Period  Weeks    Status  On-going      PT LONG TERM GOAL #5   Title  The patient will negotiate stairs in reciprocal pattern mod indep with one handrail.    Time  6    Period  Weeks    Status  On-going            Plan - 08/04/19 1218    Clinical Impression  Statement  Lt knee flexion ROM gradually improving; today to up to 96 deg on bicycle. Not able to complete full revolutions yet on bike.  Reciprocal pattern on 6" steps challenging; hip hiking present due to decreased knee flexion.  Progressing towards  goals.    Comorbidities  h/o prior L knee surgery    Examination-Activity Limitations  Squat;Stairs;Stand;Locomotion Level;Sleep    Examination-Participation Restrictions  Community Activity;Yard Work    Stability/Clinical Decision Making  Stable/Uncomplicated    Rehab Potential  Good    PT Frequency  3x / week    PT Duration  6 weeks    PT Treatment/Interventions  ADLs/Self Care Home Management;Cryotherapy;Iontophoresis 4mg /ml Dexamethasone;Moist Heat;Neuromuscular re-education;Therapeutic exercise;Therapeutic activities;Gait training;Stair training;Functional mobility training;Patient/family education;Taping;Vasopneumatic Device;Passive range of motion;Manual techniques    PT Next Visit Plan  continue progressive ROM / functional strengthening for Lt knee.    PT Home Exercise Plan  Access Code: [email or web address]    Consulted and Agree with Plan of Care  Patient       Patient will benefit from skilled therapeutic intervention in order to improve the following deficits and impairments:  Abnormal gait, Increased fascial restricitons, Decreased range of motion, Decreased strength, Decreased activity tolerance, Pain, Hypomobility, Impaired flexibility, Increased edema  Visit Diagnosis: Acute pain of left knee  Muscle weakness (generalized)  Other abnormalities of gait and mobility  Localized edema     Problem List Patient Active Problem List   Diagnosis Date Noted  . Status post left knee surgery 06/11/2019  . Primary osteoarthritis of first carpometacarpal joint of left hand 01/26/2019  . Abnormal MRI of head 10/03/2015  . Primary osteoarthritis of left knee 10/26/2013  . Headache 09/30/2013  . Obstructive sleep apnea 7cm water pressure CPAP 10/24/2010  . Rosacea 09/13/2010  . ALLERGIC RHINITIS CAUSE UNSPECIFIED 08/03/2008  . SPONDYLOLISTHESIS, ACQUIRED 05/21/2006  . SCOLIOSIS 12/31/2005  . SHOULDER/ARM SPRAIN/STRAIN, UNSPEC. 12/31/2005   03/02/2006, PTA 08/04/19 1:03  PM  [facility] [place] [address] [place], [place], [place] Phone: [phone]   Fax:  [phone]  Name: Briana Parks MRN: Annamarie Major Date of Birth: 1965-07-01

## 2019-08-06 ENCOUNTER — Other Ambulatory Visit: Payer: Self-pay

## 2019-08-06 ENCOUNTER — Ambulatory Visit (INDEPENDENT_AMBULATORY_CARE_PROVIDER_SITE_OTHER): Payer: BC Managed Care – PPO | Admitting: Physical Therapy

## 2019-08-06 ENCOUNTER — Encounter: Payer: Self-pay | Admitting: Physical Therapy

## 2019-08-06 DIAGNOSIS — M25562 Pain in left knee: Secondary | ICD-10-CM | POA: Diagnosis not present

## 2019-08-06 DIAGNOSIS — R600 Localized edema: Secondary | ICD-10-CM | POA: Diagnosis not present

## 2019-08-06 DIAGNOSIS — R2689 Other abnormalities of gait and mobility: Secondary | ICD-10-CM

## 2019-08-06 DIAGNOSIS — M6281 Muscle weakness (generalized): Secondary | ICD-10-CM

## 2019-08-06 NOTE — Therapy (Signed)
[facility] [place] [address] [place], [place], [address] Phone: [phone]   Fax:  [phone]  Physical Therapy Treatment  Patient Details  Name: Briana Parks MRN: [id number] Date of Birth: [birth year] Referring Provider (PT): Jodi Geralds, MD   Encounter Date: 08/06/2019  PT End of Session - 08/06/19 1700    Visit Number  14    Number of Visits  18    Date for PT Re-Evaluation  08/16/19    PT Start Time  1527    PT Stop Time  1615    PT Time Calculation (min)  48 min    Activity Tolerance  Patient tolerated treatment well    Behavior During Therapy  [facility] for tasks assessed/performed       Past Medical History:  Diagnosis Date  . ALLERGIC RHINITIS CAUSE UNSPECIFIED 08/03/2008   Qualifier: Diagnosis of  By: Thomos Lemons    . Arachnoid cyst   . Deviated septum    large turbinates  . Gestational diabetes   . Headache 09/30/2013  . 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 Kidney infection   . Low blood pressure   . Obstructive sleep apnea 7cm water pressure CPAP 10/24/2010   Moderate. Sleep study was performed at [facility] neurological clinic by Dr. Marliss Coots on 10/02/2010. AHI was 19.8 with desaturations to 79%. Frequent severe snoring. Moderate wedge works associated with respiratory events. Difficulty initiating and maintaining sleep with no slow wave sleep. She had severe OSA and REM.   . Osteoarthritis of left knee 10/26/2013  . Plantar fasciitis   . Pneumonia    history of  . PONV (postoperative nausea and vomiting)    hard to wake up after anesthesia, did not get PONV after 2017 surgery at [facility]. notes care everywhere  . Recurrent UTI   . Rosacea 09/13/2010  . Scoliosis   . Tinnitus   . Vertigo     Past Surgical History:  Procedure Laterality Date  . bone spur removal Right 02/17/2012   foot  . bone spur removal Left 08/2015  . HERNIA REPAIR Bilateral   . KNEE ARTHROSCOPY Left 03/16/14    [facility]   . TONSILLECTOMY AND ADENOIDECTOMY    . TOTAL KNEE ARTHROPLASTY Left 06/11/2019   Procedure: TOTAL KNEE ARTHROPLASTY;  Surgeon: Jodi Geralds, MD;  Location: [facility];  Service: Orthopedics;  Laterality: Left;  . TUBAL LIGATION      There were no vitals filed for this visit.  Subjective Assessment - 08/06/19 1559    Subjective  Pt reports she is contemplating weaning off of pain medicine this weekend.  No new changes since last visit 2 days ago.    Patient Stated Goals  Walking without a device without pain.    Currently in Pain?  No/denies    Pain Score  0-No pain         [facility] PT Assessment - 08/06/19 0001      Assessment   Medical Diagnosis  L total knee arthroplasty    Referring Provider (PT)  Jodi Geralds, MD    Onset Date/Surgical Date  06/11/19    Next MD Visit  09/02/19    Prior Therapy  known to our clinic from prior therapy      AROM   Left Knee Extension  -11      PROM   Left Knee Flexion  95   forward lunge with foot on 12: step  [place] Adult PT Treatment/Exercise - 08/06/19 0001      Ambulation/Gait   Ambulation/Gait Assistance  6: Modified independent (Device/Increase time)    Ambulation Distance (Feet)  250 Feet   20 ft lengths with mirror for feedback   Assistive device  Straight cane;None    Gait Pattern  Step-through pattern;Decreased arm swing - left;Decreased stance time - left;Decreased dorsiflexion - left;Decreased weight shift to left;Wide base of support    Pre-Gait Activities  ultra slow gait, working on Lt hip ext for toe off, increased Lt knee flexion during swing through, increased Lt DF at heel strike.     Gait Comments  Cues for decreasing base of support.       Knee/Hip Exercises: Stretches   Passive Hamstring Stretch  Left;20 seconds;3 reps    Hip Flexor Stretch  Left;30 seconds;3 reps   Thomas position   Knee: Self-Stretch Limitations   foot on 12" step x 20 sec x 4 reps; seated scoot x 10 sec x 5 reps      Gastroc Stretch  Both;30 seconds;3 reps   incline board     Knee/Hip Exercises: Aerobic   Recumbent Bike  partial revolutions for ROM x 5 min    Nustep  L4: 3 min       Knee/Hip Exercises: Standing   Lateral Step Up  Left;1 set;10 reps;Hand Hold: 2;Step Height: 6"    Forward Step Up  Left;1 set;10 reps;Hand Hold: 2;Step Height: 6"    Step Down  Right;1 set;Hand Hold: 2;Step Height: 6"    Other Standing Knee Exercises  toe taps to 12" step x 12 reps       Vasopneumatic   Number Minutes Vasopneumatic   10 minutes   double bolster under legs for elevation   Vasopnuematic Location   Knee   Lt   Vasopneumatic Pressure  Medium    Vasopneumatic Temperature   34 deg      Manual Therapy   Manual therapy comments  I strip of sensitive [place] tape applied over Lt knee incision with 20% stretch in a zig-zag pattern for scar management;  I strips of sensitive skin Rock tape applied to ant/lateral thigh to decompress and decrease chance of bruising.     Joint Mobilization  Lt patellar mobs (superior/infer)    Soft tissue mobilization  IASTM to Lt quad to decrease fascial restrictions and improve ROM.     Passive ROM  into Lt knee ext x 15 sec x 3 reps                   PT Long Term Goals - 07/28/19 1648      PT LONG TERM GOAL #1   Title  The patient will be indep with HEP.    Time  6    Period  Weeks    Status  On-going      PT LONG TERM GOAL #2   Title  The patient will report reduced functional limitation per FOTO from 70% to < or equal to 38%.    Baseline  37% limited (07/28/19)    Time  6    Period  Weeks    Status  Achieved      PT LONG TERM GOAL #3   Title  The patient will improve AROM R knee to 3-110 degrees.    Time  6    Period  Weeks    Status  On-going      PT LONG TERM GOAL #  4   Title  The patient will improve gait speed to > or equal to 2.6 ft/sec for return to full community ambulation without a device.    Baseline  6    Time  6    Period  Weeks     Status  On-going      PT LONG TERM GOAL #5   Title  The patient will negotiate stairs in reciprocal pattern mod indep with one handrail.    Time  6    Period  Weeks    Status  On-going            Plan - 08/06/19 1651    Clinical Impression Statement  Pt's Lt knee ,ROM remains limited despite aggressive stretches into flexion and ext.  Currently Lt knee ROM 11-95 deg.  Palpable tightness in Lt quad; tissue is sensitive to IASTM and easily bruises.  Rock tape applied over ant/lateral Lt quad where IASTM was performed, as well as incision for scar management.   Pt remains motivated to progress. She is progressing gradually towards LTGs.    Comorbidities  h/o prior L knee surgery    Examination-Activity Limitations  Squat;Stairs;Stand;Locomotion Level;Sleep    Examination-Participation Restrictions  Community Activity;Yard Work    Stability/Clinical Decision Making  Stable/Uncomplicated    Rehab Potential  Good    PT Frequency  3x / week    PT Duration  6 weeks    PT Treatment/Interventions  ADLs/Self Care Home Management;Cryotherapy;Iontophoresis 4mg /ml Dexamethasone;Moist Heat;Neuromuscular re-education;Therapeutic exercise;Therapeutic activities;Gait training;Stair training;Functional mobility training;Patient/family education;Taping;Vasopneumatic Device;Passive range of motion;Manual techniques    PT Next Visit Plan  continue progressive ROM / functional strengthening for Lt knee.    PT Home Exercise Plan  Access Code: [email or web address]    Consulted and Agree with Plan of Care  Patient       Patient will benefit from skilled therapeutic intervention in order to improve the following deficits and impairments:  Abnormal gait, Increased fascial restricitons, Decreased range of motion, Decreased strength, Decreased activity tolerance, Pain, Hypomobility, Impaired flexibility, Increased edema  Visit Diagnosis: Acute pain of left knee  Muscle weakness (generalized)  Other abnormalities of gait  and mobility  Localized edema     Problem List Patient Active Problem List   Diagnosis Date Noted  . Status post left knee surgery 06/11/2019  . Primary osteoarthritis of first carpometacarpal joint of left hand 01/26/2019  . Abnormal MRI of head 10/03/2015  . Primary osteoarthritis of left knee 10/26/2013  . Headache 09/30/2013  . Obstructive sleep apnea 7cm water pressure CPAP 10/24/2010  . Rosacea 09/13/2010  . ALLERGIC RHINITIS CAUSE UNSPECIFIED 08/03/2008  . SPONDYLOLISTHESIS, ACQUIRED 05/21/2006  . SCOLIOSIS 12/31/2005  . SHOULDER/ARM SPRAIN/STRAIN, UNSPEC. 12/31/2005   03/02/2006, PTA 08/06/19 5:06 PM  [facility] [place] [address] [place], [place], [place] Phone: [phone]   Fax:  [phone]  Name: Briana Parks MRN: Annamarie Major Date of Birth: February 20, 1966

## 2019-08-09 ENCOUNTER — Encounter: Payer: Self-pay | Admitting: Physical Therapy

## 2019-08-09 ENCOUNTER — Ambulatory Visit (INDEPENDENT_AMBULATORY_CARE_PROVIDER_SITE_OTHER): Payer: BC Managed Care – PPO | Admitting: Physical Therapy

## 2019-08-09 ENCOUNTER — Other Ambulatory Visit: Payer: Self-pay

## 2019-08-09 DIAGNOSIS — M25562 Pain in left knee: Secondary | ICD-10-CM | POA: Diagnosis not present

## 2019-08-09 DIAGNOSIS — R2689 Other abnormalities of gait and mobility: Secondary | ICD-10-CM | POA: Diagnosis not present

## 2019-08-09 DIAGNOSIS — R600 Localized edema: Secondary | ICD-10-CM

## 2019-08-09 DIAGNOSIS — M6281 Muscle weakness (generalized): Secondary | ICD-10-CM

## 2019-08-09 NOTE — Therapy (Signed)
[place], [place], [address] Phone: [phone]   Fax:  [phone]  Physical Therapy Treatment  Patient Details  Name: Briana Parks MRN: [id number] Date of Birth: 1966/02/24 Referring Provider (PT): Dorna Leitz, MD   Encounter Date: 08/09/2019  PT End of Session - 08/09/19 1601    Visit Number  15    Number of Visits  18    Date for PT Re-Evaluation  08/16/19    PT Start Time  5053    PT Stop Time  1605    PT Time Calculation (min)  50 min    Activity Tolerance  Patient tolerated treatment well    Behavior During Therapy  [facility] for tasks assessed/performed       Past Medical History:  Diagnosis Date  . ALLERGIC RHINITIS CAUSE UNSPECIFIED 08/03/2008   Qualifier: Diagnosis of  By: Esmeralda Arthur    . Arachnoid cyst   . Deviated septum    large turbinates  . Gestational diabetes   . Headache 09/30/2013  . 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 Kidney infection   . Low blood pressure   . Obstructive sleep apnea 7cm water pressure CPAP 10/24/2010   Moderate. Sleep study was performed at [facility] neurological clinic by Dr. Pecolia Ades on 10/02/2010. AHI was 19.8 with desaturations to 79%. Frequent severe snoring. Moderate wedge works associated with respiratory events. Difficulty initiating and maintaining sleep with no slow wave sleep. She had severe OSA and REM.   . Osteoarthritis of left knee 10/26/2013  . Plantar fasciitis   . Pneumonia    history of  . PONV (postoperative nausea and vomiting)    hard to wake up after anesthesia, did not get PONV after 2017 surgery at [facility]. notes care everywhere  . Recurrent UTI   . Rosacea 09/13/2010  . Scoliosis   . Tinnitus   . Vertigo     Past Surgical History:  Procedure Laterality Date  . bone spur removal Right 02/17/2012   foot  . bone spur removal Left 08/2015  . HERNIA REPAIR Bilateral   . KNEE ARTHROSCOPY Left 03/16/14    [facility]   . TONSILLECTOMY AND ADENOIDECTOMY    . TOTAL KNEE ARTHROPLASTY Left 06/11/2019   Procedure: TOTAL KNEE ARTHROPLASTY;  Surgeon: Dorna Leitz, MD;  Location: [facility];  Service: Orthopedics;  Laterality: Left;  . TUBAL LIGATION      There were no vitals filed for this visit.  Subjective Assessment - 08/09/19 1526    Subjective  Pt reports she is beginning to wean off of Tramadol.  She took 2 Tylenol 1 hr prior to therapy today.  She did a lot of walking this weekend, so her leg "feels more swollen"; has been elevating and Icing her knee to help with swelling.    Patient Stated Goals  Walking without a device without pain.    Currently in Pain?  No/denies    Pain Score  0-No pain         [facility] PT Assessment - 08/09/19 0001      Assessment   Medical Diagnosis  L total knee arthroplasty    Referring Provider (PT)  Dorna Leitz, MD    Onset Date/Surgical Date  06/11/19    Next MD Visit  09/02/19    Prior Therapy  known to our clinic from prior therapy      PROM   Right/Left Knee  Left    Left Knee Extension  -7    Left Knee Flexion  93      [facility] Adult PT Treatment/Exercise - 08/09/19 0001      Knee/Hip Exercises: Stretches   Passive Hamstring Stretch  Left;20 seconds;3 reps    Quad Stretch  --    Knee: Self-Stretch Limitations  Lt foot on seat of chair for increase of Lt knee flexion x 20 sec     Gastroc Stretch  Both;30 seconds;3 reps   incline board     Knee/Hip Exercises: Aerobic   Recumbent Bike  partial revolutions for ROM x 5.5 min      Knee/Hip Exercises: Standing   Lateral Step Up  Left;1 set;10 reps;Step Height: 8";Hand Hold: 1    Forward Step Up  Left;1 set;10 reps;Hand Hold: 1;Step Height: 8"    Step Down  Right;1 set;15 reps;Hand Hold: 2;Step Height: 6"    SLS  Lt SLS with intermittent support x 10-13 sec x 3 reps on mini-tramp.     Gait Training  tandem gait (without AD), slow and controlled working on keeping LLE in midline -20 ft  x 8 reps;  retro gait at counter with cues for increased hip ext x 10 ft x 5 reps      Knee/Hip Exercises: Seated   Sit to Sand  1 set;10 reps;with UE support   to seat, cues to not hike Lt hip, even wt in feet     Vasopneumatic   Number Minutes Vasopneumatic   10 minutes   double bolster under legs for elevation   Vasopnuematic Location   Knee   Lt   Vasopneumatic Pressure  Medium    Vasopneumatic Temperature   34 deg      Manual Therapy   Passive ROM  into Lt knee ext x 15 sec x 5 reps                   PT Long Term Goals - 07/28/19 1648      PT LONG TERM GOAL #1   Title  The patient will be indep with HEP.    Time  6    Period  Weeks    Status  On-going      PT LONG TERM GOAL #2   Title  The patient will report reduced functional limitation per FOTO from 70% to < or equal to 38%.    Baseline  37% limited (07/28/19)    Time  6    Period  Weeks    Status  Achieved      PT LONG TERM GOAL #3   Title  The patient will improve AROM R knee to 3-110 degrees.    Time  6    Period  Weeks    Status  On-going      PT LONG TERM GOAL #4   Title  The patient will improve gait speed to > or equal to 2.6 ft/sec for return to full community ambulation without a device.    Baseline  6    Time  6    Period  Weeks    Status  On-going      PT LONG TERM GOAL #5   Title  The patient will negotiate stairs in reciprocal pattern mod indep with one handrail.    Time  6    Period  Weeks    Status  On-going            Plan -  08/09/19 1559    Clinical Impression Statement  Pt able to tolerate ascending 8" step with LLE, and descend 6" step with less difficulty.  Lt knee ROM continues to be limited (7-93 deg PROM).  Progressing gradually towards goals.    Comorbidities  h/o prior L knee surgery    Examination-Activity Limitations  Squat;Stairs;Stand;Locomotion Level;Sleep    Examination-Participation Restrictions  Community Activity;Yard Work    Stability/Clinical  Decision Making  Stable/Uncomplicated    Rehab Potential  Good    PT Frequency  3x / week    PT Duration  6 weeks    PT Treatment/Interventions  ADLs/Self Care Home Management;Cryotherapy;Iontophoresis 4mg /ml Dexamethasone;Moist Heat;Neuromuscular re-education;Therapeutic exercise;Therapeutic activities;Gait training;Stair training;Functional mobility training;Patient/family education;Taping;Vasopneumatic Device;Passive range of motion;Manual techniques    PT Next Visit Plan  continue progressive ROM / functional strengthening for Lt knee.    PT Home Exercise Plan  Access Code: [email or web address]    Consulted and Agree with Plan of Care  Patient       Patient will benefit from skilled therapeutic intervention in order to improve the following deficits and impairments:  Abnormal gait, Increased fascial restricitons, Decreased range of motion, Decreased strength, Decreased activity tolerance, Pain, Hypomobility, Impaired flexibility, Increased edema  Visit Diagnosis: Acute pain of left knee  Muscle weakness (generalized)  Other abnormalities of gait and mobility  Localized edema     Problem List Patient Active Problem List   Diagnosis Date Noted  . Status post left knee surgery 06/11/2019  . Primary osteoarthritis of first carpometacarpal joint of left hand 01/26/2019  . Abnormal MRI of head 10/03/2015  . Primary osteoarthritis of left knee 10/26/2013  . Headache 09/30/2013  . Obstructive sleep apnea 7cm water pressure CPAP 10/24/2010  . Rosacea 09/13/2010  . ALLERGIC RHINITIS CAUSE UNSPECIFIED 08/03/2008  . SPONDYLOLISTHESIS, ACQUIRED 05/21/2006  . SCOLIOSIS 12/31/2005  . SHOULDER/ARM SPRAIN/STRAIN, UNSPEC. 12/31/2005   03/02/2006, PTA 08/09/19 9:40 PM  [facility] [place] [address] [place], [place], [place] Phone: [phone]   Fax:  [phone]  Name: Briana Parks MRN: Annamarie Major Date of Birth:  06/01/1965

## 2019-08-11 ENCOUNTER — Other Ambulatory Visit: Payer: Self-pay

## 2019-08-11 ENCOUNTER — Ambulatory Visit (INDEPENDENT_AMBULATORY_CARE_PROVIDER_SITE_OTHER): Payer: BC Managed Care – PPO | Admitting: Physical Therapy

## 2019-08-11 ENCOUNTER — Encounter: Payer: Self-pay | Admitting: Physical Therapy

## 2019-08-11 DIAGNOSIS — M6281 Muscle weakness (generalized): Secondary | ICD-10-CM | POA: Diagnosis not present

## 2019-08-11 DIAGNOSIS — M25562 Pain in left knee: Secondary | ICD-10-CM | POA: Diagnosis not present

## 2019-08-11 DIAGNOSIS — R600 Localized edema: Secondary | ICD-10-CM

## 2019-08-11 DIAGNOSIS — R2689 Other abnormalities of gait and mobility: Secondary | ICD-10-CM

## 2019-08-11 NOTE — Therapy (Signed)
[place], [place], [address] Phone: [phone]   Fax:  [phone]  Physical Therapy Treatment  Patient Details  Name: Briana Parks MRN: [id number] Date of Birth: 1965/06/02 Referring Provider (PT): Dorna Leitz, MD   Encounter Date: 08/11/2019  PT End of Session - 08/11/19 1620    Visit Number  16    Number of Visits  18    Date for PT Re-Evaluation  08/16/19    PT Start Time  6789    PT Stop Time  1605    PT Time Calculation (min)  48 min    Activity Tolerance  Patient tolerated treatment well    Behavior During Therapy  [facility] for tasks assessed/performed       Past Medical History:  Diagnosis Date  . ALLERGIC RHINITIS CAUSE UNSPECIFIED 08/03/2008   Qualifier: Diagnosis of  By: Esmeralda Arthur    . Arachnoid cyst   . Deviated septum    large turbinates  . Gestational diabetes   . Headache 09/30/2013  . 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 Kidney infection   . Low blood pressure   . Obstructive sleep apnea 7cm water pressure CPAP 10/24/2010   Moderate. Sleep study was performed at [facility] neurological clinic by Dr. Pecolia Ades on 10/02/2010. AHI was 19.8 with desaturations to 79%. Frequent severe snoring. Moderate wedge works associated with respiratory events. Difficulty initiating and maintaining sleep with no slow wave sleep. She had severe OSA and REM.   . Osteoarthritis of left knee 10/26/2013  . Plantar fasciitis   . Pneumonia    history of  . PONV (postoperative nausea and vomiting)    hard to wake up after anesthesia, did not get PONV after 2017 surgery at [facility]. notes care everywhere  . Recurrent UTI   . Rosacea 09/13/2010  . Scoliosis   . Tinnitus   . Vertigo     Past Surgical History:  Procedure Laterality Date  . bone spur removal Right 02/17/2012   foot  . bone spur removal Left 08/2015  . HERNIA REPAIR Bilateral   . KNEE ARTHROSCOPY Left 03/16/14   [facility]   . TONSILLECTOMY AND ADENOIDECTOMY    . TOTAL KNEE ARTHROPLASTY Left 06/11/2019   Procedure: TOTAL KNEE ARTHROPLASTY;  Surgeon: Dorna Leitz, MD;  Location: [facility];  Service: Orthopedics;  Laterality: Left;  . TUBAL LIGATION      There were no vitals filed for this visit.  Subjective Assessment - 08/11/19 1609    Subjective  Pt tearful upon arrival. She reports that she has had episodes of sharp pain on lateral Lt knee, "like before surgery" throughout the day.  Did not sleep well last night.    Patient Stated Goals  Walking without a device without pain.    Currently in Pain?  Yes    Pain Score  2     Pain Location  Knee    Pain Orientation  Left    Pain Descriptors / Indicators  Aching;Sore;Tightness    Aggravating Factors   bending knee, stairs    Pain Relieving Factors  rest, massage, ice         [facility] PT Assessment - 08/11/19 0001      Assessment   Medical Diagnosis  L total knee arthroplasty    Referring Provider (PT)  Dorna Leitz, MD    Onset Date/Surgical Date  06/11/19    Next MD Visit  09/02/19    Prior Therapy  known to our clinic from prior therapy      PROM   Left Knee Flexion  98      [facility] Adult PT Treatment/Exercise - 08/11/19 0001      Knee/Hip Exercises: Stretches   Passive Hamstring Stretch  Left;3 reps;30 seconds   supine, overpressure into ext   Knee: Self-Stretch Limitations  Lt foot on low black mat, lunging forward x 30 sec x 3 reps    Gastroc Stretch  Both;30 seconds;3 reps   incline board   Other Knee/Hip Stretches  assisted Lt knee towards chest with strap and overpressure into Lt knee flexion x 20 sec x 4 reps       Knee/Hip Exercises: Aerobic   Nustep  L4: warm up 2.5 min, then 5 min working on long holds stretching Lt knee into flexion     Other Aerobic  single laps around gym (with SPC- 80 ft) in between exercises to decrease stiffness.       Knee/Hip Exercises: Standing   Gait Training  tandem slow gait x  18 ft x 4 reps       Knee/Hip Exercises: Seated   Sit to Sand  1 set;10 reps;without UE support   to NuStep seat, mirror for feedback     Vasopneumatic   Number Minutes Vasopneumatic   10 minutes   double bolster under legs for elevation   Vasopnuematic Location   Knee   Lt   Vasopneumatic Pressure  Medium    Vasopneumatic Temperature   34 deg      Manual Therapy   Manual therapy comments  I strip or Reg rock tape applied with 50% stretch to lateral portion of Lt knee to decompress tissue and decrease pain.                   PT Long Term Goals - 07/28/19 1648      PT LONG TERM GOAL #1   Title  The patient will be indep with HEP.    Time  6    Period  Weeks    Status  On-going      PT LONG TERM GOAL #2   Title  The patient will report reduced functional limitation per FOTO from 70% to < or equal to 38%.    Baseline  37% limited (07/28/19)    Time  6    Period  Weeks    Status  Achieved      PT LONG TERM GOAL #3   Title  The patient will improve AROM R knee to 3-110 degrees.    Time  6    Period  Weeks    Status  On-going      PT LONG TERM GOAL #4   Title  The patient will improve gait speed to > or equal to 2.6 ft/sec for return to full community ambulation without a device.    Baseline  6    Time  6    Period  Weeks    Status  On-going      PT LONG TERM GOAL #5   Title  The patient will negotiate stairs in reciprocal pattern mod indep with one handrail.    Time  6    Period  Weeks    Status  On-going            Plan - 08/11/19 1624    Clinical Impression Statement  Pt's PROM in Lt knee  reached 98 deg, despite reported elevated pain rating upon arrival. Pain reduces with rest in between exercises.  Pt participated well throughout session and remain motivated to progress towards goals.    Comorbidities  h/o prior L knee surgery    Examination-Activity Limitations  Squat;Stairs;Stand;Locomotion Level;Sleep    Examination-Participation Restrictions   Community Activity;Yard Work    Stability/Clinical Decision Making  Stable/Uncomplicated    Rehab Potential  Good    PT Frequency  3x / week    PT Duration  6 weeks    PT Treatment/Interventions  ADLs/Self Care Home Management;Cryotherapy;Iontophoresis 4mg /ml Dexamethasone;Moist Heat;Neuromuscular re-education;Therapeutic exercise;Therapeutic activities;Gait training;Stair training;Functional mobility training;Patient/family education;Taping;Vasopneumatic Device;Passive range of motion;Manual techniques    PT Next Visit Plan  continue progressive ROM / functional strengthening for Lt knee.    PT Home Exercise Plan  Access Code: [email or web address]    Consulted and Agree with Plan of Care  Patient       Patient will benefit from skilled therapeutic intervention in order to improve the following deficits and impairments:  Abnormal gait, Increased fascial restricitons, Decreased range of motion, Decreased strength, Decreased activity tolerance, Pain, Hypomobility, Impaired flexibility, Increased edema  Visit Diagnosis: Acute pain of left knee  Muscle weakness (generalized)  Other abnormalities of gait and mobility  Localized edema     Problem List Patient Active Problem List   Diagnosis Date Noted  . Status post left knee surgery 06/11/2019  . Primary osteoarthritis of first carpometacarpal joint of left hand 01/26/2019  . Abnormal MRI of head 10/03/2015  . Primary osteoarthritis of left knee 10/26/2013  . Headache 09/30/2013  . Obstructive sleep apnea 7cm water pressure CPAP 10/24/2010  . Rosacea 09/13/2010  . ALLERGIC RHINITIS CAUSE UNSPECIFIED 08/03/2008  . SPONDYLOLISTHESIS, ACQUIRED 05/21/2006  . SCOLIOSIS 12/31/2005  . SHOULDER/ARM SPRAIN/STRAIN, UNSPEC. 12/31/2005   03/02/2006, PTA 08/11/19 4:30 PM  [facility] [place] [address] [place] [address] [place], [place], [place] Phone: [phone]   Fax:  [phone]  Name: Briana Parks MRN: Annamarie Major Date of Birth: 05-24-1965

## 2019-08-13 ENCOUNTER — Other Ambulatory Visit: Payer: Self-pay

## 2019-08-13 ENCOUNTER — Ambulatory Visit (INDEPENDENT_AMBULATORY_CARE_PROVIDER_SITE_OTHER): Payer: BC Managed Care – PPO | Admitting: Physical Therapy

## 2019-08-13 ENCOUNTER — Encounter: Payer: Self-pay | Admitting: Physical Therapy

## 2019-08-13 DIAGNOSIS — R600 Localized edema: Secondary | ICD-10-CM

## 2019-08-13 DIAGNOSIS — M25562 Pain in left knee: Secondary | ICD-10-CM | POA: Diagnosis not present

## 2019-08-13 DIAGNOSIS — M6281 Muscle weakness (generalized): Secondary | ICD-10-CM

## 2019-08-13 DIAGNOSIS — R2689 Other abnormalities of gait and mobility: Secondary | ICD-10-CM

## 2019-08-13 NOTE — Therapy (Signed)
[place], [place], [address] Phone: [phone]   Fax:  [phone]  Physical Therapy Treatment  Patient Details  Name: Briana Parks MRN: [id number] Date of Birth: Jul 01, 1965 Referring Provider (PT): Dorna Leitz, MD   Encounter Date: 08/13/2019  PT End of Session - 08/13/19 1537    Visit Number  17    Number of Visits  18    Date for PT Re-Evaluation  08/16/19    PT Start Time  1528    PT Stop Time  1618    PT Time Calculation (min)  50 min    Activity Tolerance  Patient tolerated treatment well    Behavior During Therapy  [facility] for tasks assessed/performed       Past Medical History:  Diagnosis Date  . ALLERGIC RHINITIS CAUSE UNSPECIFIED 08/03/2008   Qualifier: Diagnosis of  By: Esmeralda Arthur    . Arachnoid cyst   . Deviated septum    large turbinates  . Gestational diabetes   . Headache 09/30/2013  . 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 Kidney infection   . Low blood pressure   . Obstructive sleep apnea 7cm water pressure CPAP 10/24/2010   Moderate. Sleep study was performed at [facility] neurological clinic by Dr. Pecolia Ades on 10/02/2010. AHI was 19.8 with desaturations to 79%. Frequent severe snoring. Moderate wedge works associated with respiratory events. Difficulty initiating and maintaining sleep with no slow wave sleep. She had severe OSA and REM.   . Osteoarthritis of left knee 10/26/2013  . Plantar fasciitis   . Pneumonia    history of  . PONV (postoperative nausea and vomiting)    hard to wake up after anesthesia, did not get PONV after 2017 surgery at [facility]. notes care everywhere  . Recurrent UTI   . Rosacea 09/13/2010  . Scoliosis   . Tinnitus   . Vertigo     Past Surgical History:  Procedure Laterality Date  . bone spur removal Right 02/17/2012   foot  . bone spur removal Left 08/2015  . HERNIA REPAIR Bilateral   . KNEE ARTHROSCOPY Left 03/16/14   [facility]   . TONSILLECTOMY AND ADENOIDECTOMY    . TOTAL KNEE ARTHROPLASTY Left 06/11/2019   Procedure: TOTAL KNEE ARTHROPLASTY;  Surgeon: Dorna Leitz, MD;  Location: [facility];  Service: Orthopedics;  Laterality: Left;  . TUBAL LIGATION      There were no vitals filed for this visit.  Subjective Assessment - 08/13/19 1532    Subjective  Pt riding her stationary bike at home 5 min, 2x/day.  She states she not sleeping well; waking every 2 hrs due to pain in knee.  She received approval to have DN on her LE.    Patient Stated Goals  Walking without a device without pain.    Currently in Pain?  No/denies    Pain Score  0-No pain         [facility] PT Assessment - 08/13/19 0001      Assessment   Medical Diagnosis  L total knee arthroplasty    Referring Provider (PT)  Dorna Leitz, MD    Onset Date/Surgical Date  06/11/19    Next MD Visit  09/02/19    Prior Therapy  known to our clinic from prior therapy      PROM   Right/Left Knee  Left    Left Knee Extension  -5  [facility] Adult PT Treatment/Exercise - 08/13/19 0001      Knee/Hip Exercises: Stretches   Passive Hamstring Stretch  Left;1 rep;60 seconds    Hip Flexor Stretch  Left;2 reps;60 seconds   seated in chair   Gastroc Stretch  Both;30 seconds;2 reps   incline board     Knee/Hip Exercises: Aerobic   Recumbent Bike  partial to full revolutions x 5 min for ROM    Nustep  L3: for warm up and ROM, 5 min       Knee/Hip Exercises: Standing   Hip Abduction  Stengthening;Right;Left;1 set;5 reps    Forward Step Up  Left;1 set;10 reps;Hand Hold: 1;Step Height: 8"      Knee/Hip Exercises: Sidelying   Hip ABduction  Strengthening;Left;1 set;10 reps      Knee/Hip Exercises: Prone   Hamstring Curl  2 sets;5 reps    Prone Knee Hang  1 minute   3 reps   Prone Knee Hang Weights (lbs)  2 lb cuff weight      Vasopneumatic   Number Minutes Vasopneumatic   10 minutes   double bolster under legs for elevation    Vasopnuematic Location   Knee   Lt   Vasopneumatic Pressure  Medium    Vasopneumatic Temperature   34 deg      Manual Therapy   Soft tissue mobilization  IASTM and STM to Lt hamstring to decrease fascial tightness and improve ROM.     Passive ROM  into Lt knee ext x 15 sec x 5 reps         PT Long Term Goals - 07/28/19 1648      PT LONG TERM GOAL #1   Title  The patient will be indep with HEP.    Time  6    Period  Weeks    Status  On-going      PT LONG TERM GOAL #2   Title  The patient will report reduced functional limitation per FOTO from 70% to < or equal to 38%.    Baseline  37% limited (07/28/19)    Time  6    Period  Weeks    Status  Achieved      PT LONG TERM GOAL #3   Title  The patient will improve AROM R knee to 3-110 degrees.    Time  6    Period  Weeks    Status  On-going      PT LONG TERM GOAL #4   Title  The patient will improve gait speed to > or equal to 2.6 ft/sec for return to full community ambulation without a device.    Baseline  6    Time  6    Period  Weeks    Status  On-going      PT LONG TERM GOAL #5   Title  The patient will negotiate stairs in reciprocal pattern mod indep with one handrail.    Time  6    Period  Weeks    Status  On-going            Plan - 08/13/19 1544    Clinical Impression Statement  Continued focus during session on Lt knee/hip ROM.  Pt's Lt knee ROM continues to gradually improve. She has some palpable tightness in both her left lateral quad and lateral hamstring; may benefit from further manual therapy/ DN to this area. Pt progressing towards goals.    Comorbidities  h/o prior L knee surgery  Examination-Activity Limitations  Squat;Stairs;Stand;Locomotion Level;Sleep    Examination-Participation Restrictions  Community Activity;Yard Work    Stability/Clinical Decision Making  Stable/Uncomplicated    Rehab Potential  Good    PT Frequency  3x / week    PT Duration  6 weeks    PT Treatment/Interventions   ADLs/Self Care Home Management;Cryotherapy;Iontophoresis 4mg /ml Dexamethasone;Moist Heat;Neuromuscular re-education;Therapeutic exercise;Therapeutic activities;Gait training;Stair training;Functional mobility training;Patient/family education;Taping;Vasopneumatic Device;Passive range of motion;Manual techniques    PT Next Visit Plan DN. Assess goals and recert (add DN into plan)   PT Home Exercise Plan  Access Code: [email or web address]    Consulted and Agree with Plan of Care  Patient       Patient will benefit from skilled therapeutic intervention in order to improve the following deficits and impairments:  Abnormal gait, Increased fascial restricitons, Decreased range of motion, Decreased strength, Decreased activity tolerance, Pain, Hypomobility, Impaired flexibility, Increased edema  Visit Diagnosis: Acute pain of left knee  Muscle weakness (generalized)  Other abnormalities of gait and mobility  Localized edema     Problem List Patient Active Problem List   Diagnosis Date Noted  . Status post left knee surgery 06/11/2019  . Primary osteoarthritis of first carpometacarpal joint of left hand 01/26/2019  . Abnormal MRI of head 10/03/2015  . Primary osteoarthritis of left knee 10/26/2013  . Headache 09/30/2013  . Obstructive sleep apnea 7cm water pressure CPAP 10/24/2010  . Rosacea 09/13/2010  . ALLERGIC RHINITIS CAUSE UNSPECIFIED 08/03/2008  . SPONDYLOLISTHESIS, ACQUIRED 05/21/2006  . SCOLIOSIS 12/31/2005  . SHOULDER/ARM SPRAIN/STRAIN, UNSPEC. 12/31/2005   03/02/2006, PTA 08/13/19 4:29 PM  [facility] [place] [address] [place] [address] [place], [place], [place] Phone: [phone]   Fax:  [phone]  Name: SADIA BELFIORE MRN: Annamarie Major Date of Birth: Aug 10, 1965

## 2019-08-16 ENCOUNTER — Encounter: Payer: BC Managed Care – PPO | Admitting: Physical Therapy

## 2019-08-17 ENCOUNTER — Encounter: Payer: Self-pay | Admitting: Physical Therapy

## 2019-08-17 ENCOUNTER — Other Ambulatory Visit: Payer: Self-pay

## 2019-08-17 ENCOUNTER — Ambulatory Visit (INDEPENDENT_AMBULATORY_CARE_PROVIDER_SITE_OTHER): Payer: BC Managed Care – PPO | Admitting: Physical Therapy

## 2019-08-17 DIAGNOSIS — R2689 Other abnormalities of gait and mobility: Secondary | ICD-10-CM

## 2019-08-17 DIAGNOSIS — R600 Localized edema: Secondary | ICD-10-CM

## 2019-08-17 DIAGNOSIS — M25562 Pain in left knee: Secondary | ICD-10-CM

## 2019-08-17 DIAGNOSIS — M6281 Muscle weakness (generalized): Secondary | ICD-10-CM

## 2019-08-17 NOTE — Therapy (Signed)
[address] [place], [place], [address] Phone: [phone]   Fax:  [phone]  Physical Therapy Treatment  Patient Details  Name: Briana Parks MRN: [id number] Date of Birth: [birth year] Referring Provider (PT): Dorna Leitz, MD   Encounter Date: 08/17/2019  PT End of Session - 08/17/19 1017    Visit Number  18    Number of Visits  30    Date for PT Re-Evaluation  09/14/19    PT Start Time  1018    PT Stop Time  1102    PT Time Calculation (min)  44 min    Activity Tolerance  Patient tolerated treatment well    Behavior During Therapy  [facility] for tasks assessed/performed       Past Medical History:  Diagnosis Date  . ALLERGIC RHINITIS CAUSE UNSPECIFIED 08/03/2008   Qualifier: Diagnosis of  By: Esmeralda Arthur    . Arachnoid cyst   . Deviated septum    large turbinates  . Gestational diabetes   . Headache 09/30/2013  . 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 Kidney infection   . Low blood pressure   . Obstructive sleep apnea 7cm water pressure CPAP 10/24/2010   Moderate. Sleep study was performed at [facility] neurological clinic by Dr. Pecolia Ades on 10/02/2010. AHI was 19.8 with desaturations to 79%. Frequent severe snoring. Moderate wedge works associated with respiratory events. Difficulty initiating and maintaining sleep with no slow wave sleep. She had severe OSA and REM.   . Osteoarthritis of left knee 10/26/2013  . Plantar fasciitis   . Pneumonia    history of  . PONV (postoperative nausea and vomiting)    hard to wake up after anesthesia, did not get PONV after 2017 surgery at [facility]. notes care everywhere  . Recurrent UTI   . Rosacea 09/13/2010  . Scoliosis   . Tinnitus   . Vertigo     Past Surgical History:  Procedure Laterality Date  . bone spur removal Right 02/17/2012   foot  . bone spur removal Left 08/2015  . HERNIA REPAIR Bilateral   . KNEE ARTHROSCOPY Left 03/16/14   [facility]   . TONSILLECTOMY AND ADENOIDECTOMY    . TOTAL KNEE ARTHROPLASTY Left 06/11/2019   Procedure: TOTAL KNEE ARTHROPLASTY;  Surgeon: Dorna Leitz, MD;  Location: [facility];  Service: Orthopedics;  Laterality: Left;  . TUBAL LIGATION      There were no vitals filed for this visit.  Subjective Assessment - 08/17/19 1019    Subjective  Patient still unable to sleep through the night even with meds MD gave her. She is unable to bend her knee fully. Limited with stairs up and down due to ROM deficits.    Patient Stated Goals  normalize gait    Currently in Pain?  No/denies         [facility] PT Assessment - 08/17/19 0001      Assessment   Medical Diagnosis  L total knee arthroplasty    Referring Provider (PT)  Dorna Leitz, MD    Onset Date/Surgical Date  06/11/19    Next MD Visit  09/02/19      Prior Function   Level of Independence  Independent      Observation/Other Assessments   Focus on Therapeutic Outcomes (FOTO)   37% limited       ROM / Strength   AROM / PROM / Strength  AROM;PROM  AROM   AROM Assessment Site  Knee    Right/Left Knee  Left    Left Knee Extension  -10    Left Knee Flexion  92      PROM   PROM Assessment Site  Knee    Right/Left Knee  Left    Left Knee Extension  -3    Left Knee Flexion  98      Palpation   Palpation comment  marked tension and TPs in mid and lateral left quads, gastroc/soleus      Ambulation/Gait   Ambulation/Gait  Yes    Ambulation/Gait Assistance  6: Modified independent (Device/Increase time)    Ambulation Distance (Feet)  80 Feet    Gait Pattern  Decreased step length - right;Decreased step length - left;Decreased stance time - right;Decreased hip/knee flexion - left;Left flexed knee in stance    Ambulation Surface  Level    Gait velocity  2.67 ft/sec    Stair Management Technique  --    Number of Stairs  --                    [facility] Adult PT Treatment/Exercise - 08/17/19 0001       Knee/Hip Exercises: Stretches   Passive Hamstring Stretch  Left;1 rep;60 seconds    Hip Flexor Stretch  Left;2 reps;60 seconds   seated in chair   Gastroc Stretch  Both;30 seconds;2 reps   incline board     Knee/Hip Exercises: Aerobic   Recumbent Bike  partial to full revolutions x 5 min for ROM      Manual Therapy   Manual Therapy  Soft tissue mobilization;Myofascial release    Manual therapy comments  skilled palpation and monitoring of soft tissues during DN    Soft tissue mobilization  to left quadriceps mid and lateral, HS and gastroc/soleus    Myofascial Release  to left ITB with passive stretch    Passive ROM  into Left knee flex/ext       Trigger Point Dry Needling - 08/17/19 0001    Consent Given?  Yes    Education Handout Provided  Yes    Muscles Treated Lower Quadrant  Quadriceps;Gastrocnemius;Soleus    Dry Needling Comments  left    Quadriceps Response  Twitch response elicited;Palpable increased muscle length    Gastrocnemius Response  Twitch response elicited;Palpable increased muscle length    Soleus Response  Palpable increased muscle length           PT Education - 08/17/19 1112    Education Details  DN education and aftercare    Person(s) Educated  Patient    Methods  Explanation;Handout    Comprehension  Verbalized understanding          PT Long Term Goals - 08/17/19 1113      PT LONG TERM GOAL #1   Title  The patient will be indep with HEP.    Status  Partially Met      PT LONG TERM GOAL #2   Title  The patient will report reduced functional limitation per FOTO from 70% to < or equal to 38%.    Status  Achieved      PT LONG TERM GOAL #3   Title  The patient will improve AROM Lt knee to 3-110 degrees.    Baseline  10-92 left knee flexion    Status  On-going      PT LONG TERM GOAL #4   Title  The patient  will improve gait speed to > or equal to 2.6 ft/sec for return to full community ambulation without a device.    Baseline  2.67 ft/sec     Status  Achieved      PT LONG TERM GOAL #5   Title  The patient will negotiate stairs in reciprocal pattern mod indep with one handrail.    Status  On-going            Plan - 08/17/19 1116    Clinical Impression Statement  Patient continues to have significant tightness in her left knee affecting gait and stairs. She was limited to 10-92 degrees of flexion today. She is also limted with sleep, waking every 2 hours or less throughtout the night. Patient responded well to DN and manual therapy in the quads and gastroc/soleus muscles today. She will benefit from continued PT to decrease tissue tension and increase knee ROM to normalize her gait and allow her to climb stairs with a reciprocal gait.    Comorbidities  h/o prior L knee surgery    Examination-Activity Limitations  Squat;Stairs;Stand;Locomotion Level;Sleep    PT Frequency  3x / week    PT Duration  4 weeks    PT Treatment/Interventions  ADLs/Self Care Home Management;Cryotherapy;Iontophoresis 33m/ml Dexamethasone;Moist Heat;Neuromuscular re-education;Therapeutic exercise;Therapeutic activities;Gait training;Stair training;Functional mobility training;Patient/family education;Taping;Vasopneumatic Device;Passive range of motion;Manual techniques    PT Next Visit Plan  Assess response to DN/manual therapy, continue progressive ROM / functional strengthening for Lt knee.    PT Home Exercise Plan  Access Code: [email or web address]   Consulted and Agree with Plan of Care  Patient       Patient will benefit from skilled therapeutic intervention in order to improve the following deficits and impairments:  Abnormal gait, Increased fascial restricitons, Decreased range of motion, Decreased strength, Decreased activity tolerance, Pain, Hypomobility, Impaired flexibility, Increased edema  Visit Diagnosis: Acute pain of left knee - Plan: PT plan of care cert/re-cert  Muscle weakness (generalized) - Plan: PT plan of care cert/re-cert  Other  abnormalities of gait and mobility - Plan: PT plan of care cert/re-cert  Localized edema - Plan: PT plan of care cert/re-cert     Problem List Patient Active Problem List   Diagnosis Date Noted  . Status post left knee surgery 06/11/2019  . Primary osteoarthritis of first carpometacarpal joint of left hand 01/26/2019  . Abnormal MRI of head 10/03/2015  . Primary osteoarthritis of left knee 10/26/2013  . Headache 09/30/2013  . Obstructive sleep apnea 7cm water pressure CPAP 10/24/2010  . Rosacea 09/13/2010  . ALLERGIC RHINITIS CAUSE UNSPECIFIED 08/03/2008  . SPONDYLOLISTHESIS, ACQUIRED 05/21/2006  . SCOLIOSIS 12/31/2005  . SHOULDER/ARM SPRAIN/STRAIN, UNSPEC. 12/31/2005    JMadelyn FlavorsPT 08/17/2019, 11:40 AM  [facility] [address]Phone: [phone]  Fax:  [phone] Name: Briana HIGHAMMRN: [id number] of Birth: 211-02-1966

## 2019-08-17 NOTE — Addendum Note (Signed)
Addended by: Gearlean Alf on: 08/17/2019 12:37 PM   Modules accepted: Orders

## 2019-08-17 NOTE — Patient Instructions (Signed)
Trigger Point Dry Needling  . What is Trigger Point Dry Needling (DN)? o DN is a physical therapy technique used to treat muscle pain and dysfunction. Specifically, DN helps deactivate muscle trigger points (muscle knots).  o A thin filiform needle is used to penetrate the skin and stimulate the underlying trigger point. The goal is for a local twitch response (LTR) to occur and for the trigger point to relax. No medication of any kind is injected during the procedure.   . What Does Trigger Point Dry Needling Feel Like?  o The procedure feels different for each individual patient. Some patients report that they do not actually feel the needle enter the skin and overall the process is not painful. Very mild bleeding may occur. However, many patients feel a deep cramping in the muscle in which the needle was inserted. This is the local twitch response.   Marland Kitchen How Will I feel after the treatment? o Soreness is normal, and the onset of soreness may not occur for a few hours. Typically this soreness does not last longer than two days.  o Bruising is uncommon, however; ice can be used to decrease any possible bruising.  o In rare cases feeling tired or nauseous after the treatment is normal. In addition, your symptoms may get worse before they get better, this period will typically not last longer than 24 hours.   . What Can I do After My Treatment? o Increase your hydration by drinking more water for the next 24 hours. o You may place ice or heat on the areas treated that have become sore, however, do not use heat on inflamed or bruised areas. Heat often brings more relief post needling. o You can continue your regular activities, but vigorous activity is not recommended initially after the treatment for 24 hours. o DN is best combined with other physical therapy such as strengthening, stretching, and other therapies.    Solon Palm, PT 08/17/19 10:59 AM;  [facility] at [facility] [address] [place], [place] [address]  [phone] (office) [phone] (fax)

## 2019-08-18 ENCOUNTER — Ambulatory Visit (INDEPENDENT_AMBULATORY_CARE_PROVIDER_SITE_OTHER): Payer: BC Managed Care – PPO | Admitting: Physical Therapy

## 2019-08-18 ENCOUNTER — Encounter: Payer: Self-pay | Admitting: Physical Therapy

## 2019-08-18 DIAGNOSIS — R2689 Other abnormalities of gait and mobility: Secondary | ICD-10-CM

## 2019-08-18 DIAGNOSIS — R600 Localized edema: Secondary | ICD-10-CM

## 2019-08-18 DIAGNOSIS — M6281 Muscle weakness (generalized): Secondary | ICD-10-CM

## 2019-08-18 DIAGNOSIS — M25562 Pain in left knee: Secondary | ICD-10-CM | POA: Diagnosis not present

## 2019-08-18 NOTE — Therapy (Signed)
[place], [place], [address] Phone: [phone]   Fax:  [phone]  Physical Therapy Treatment  Patient Details  Name: Briana Parks MRN: [id number] Date of Birth: [birth year] Referring Provider (PT): Dorna Leitz, MD   Encounter Date: 08/18/2019  PT End of Session - 08/18/19 1524    Visit Number  19    Number of Visits  30    Date for PT Re-Evaluation  09/14/19    PT Start Time  1518    PT Stop Time  1604    PT Time Calculation (min)  46 min    Activity Tolerance  Patient tolerated treatment well    Behavior During Therapy  [facility] for tasks assessed/performed       Past Medical History:  Diagnosis Date  . ALLERGIC RHINITIS CAUSE UNSPECIFIED 08/03/2008   Qualifier: Diagnosis of  By: Esmeralda Arthur    . Arachnoid cyst   . Deviated septum    large turbinates  . Gestational diabetes   . Headache 09/30/2013  . 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 Kidney infection   . Low blood pressure   . Obstructive sleep apnea 7cm water pressure CPAP 10/24/2010   Moderate. Sleep study was performed at [facility] neurological clinic by Dr. Pecolia Ades on 10/02/2010. AHI was 19.8 with desaturations to 79%. Frequent severe snoring. Moderate wedge works associated with respiratory events. Difficulty initiating and maintaining sleep with no slow wave sleep. She had severe OSA and REM.   . Osteoarthritis of left knee 10/26/2013  . Plantar fasciitis   . Pneumonia    history of  . PONV (postoperative nausea and vomiting)    hard to wake up after anesthesia, did not get PONV after 2017 surgery at [facility]. notes care everywhere  . Recurrent UTI   . Rosacea 09/13/2010  . Scoliosis   . Tinnitus   . Vertigo     Past Surgical History:  Procedure Laterality Date  . bone spur removal Right 02/17/2012   foot  . bone spur removal Left 08/2015  . HERNIA REPAIR Bilateral   . KNEE ARTHROSCOPY Left 03/16/14   [facility]   . TONSILLECTOMY AND ADENOIDECTOMY    . TOTAL KNEE ARTHROPLASTY Left 06/11/2019   Procedure: TOTAL KNEE ARTHROPLASTY;  Surgeon: Dorna Leitz, MD;  Location: [facility];  Service: Orthopedics;  Laterality: Left;  . TUBAL LIGATION      There were no vitals filed for this visit.  Subjective Assessment - 08/18/19 1524    Subjective  Pt reports she is still unable to sleep well.  She did well after DN yesterday; noticing increased range in knee.  Had gotten onto floor to clean; knee's a little more swollen now.    Currently in Pain?  No/denies   took pain medicine prior to sessions   Pain Score  0-No pain         [facility] PT Assessment - 08/18/19 0001      Assessment   Medical Diagnosis  L total knee arthroplasty    Referring Provider (PT)  Dorna Leitz, MD    Onset Date/Surgical Date  06/11/19    Next MD Visit  09/02/19       [facility] Adult PT Treatment/Exercise - 08/18/19 0001      Knee/Hip Exercises: Stretches   Passive Hamstring Stretch  Left;1 rep;60 seconds    Gastroc Stretch  Both;30 seconds;2 reps   incline board  Knee/Hip Exercises: Aerobic   Recumbent Bike  full slow revolutions (forward, backward) x 4 min     Nustep  L4: 5 min for warm up and ROM.     Other Aerobic  single laps around gym (with [place]- 80 ft) in between exercises to decrease stiffness.       Knee/Hip Exercises: Standing   SLS  Lt SLS x 3 reps 10-22 seconds.     Other Standing Knee Exercises  squat to buttocks touching 10" step  x 8 reps with UE on back of truck for support.        Knee/Hip Exercises: Seated   Other Seated Knee/Hip Exercises  tall kneeling on 3" pad x 1 min for quad/hip/knee stretch;  single high kneeling on Rt knee with forward Lt lunge for Lt knee ROM; Lt kneeling on pad for Lt hip flexor stretch       Vasopneumatic   Number Minutes Vasopneumatic   10 minutes    Vasopnuematic Location   Knee   Lt   Vasopneumatic Pressure  Medium    Vasopneumatic  Temperature   34 deg        PT Long Term Goals - 08/17/19 1113      PT LONG TERM GOAL #1   Title  The patient will be indep with HEP.    Status  Partially Met      PT LONG TERM GOAL #2   Title  The patient will report reduced functional limitation per FOTO from 70% to < or equal to 38%.    Status  Achieved      PT LONG TERM GOAL #3   Title  The patient will improve AROM Lt knee to 3-110 degrees.    Baseline  10-92 left knee flexion    Status  On-going      PT LONG TERM GOAL #4   Title  The patient will improve gait speed to > or equal to 2.6 ft/sec for return to full community ambulation without a device.    Baseline  2.67 ft/sec    Status  Achieved      PT LONG TERM GOAL #5   Title  The patient will negotiate stairs in reciprocal pattern mod indep with one handrail.    Status  On-going            Plan - 08/18/19 1557    Clinical Impression Statement  Pt able to complete full revolutions on bike for first time.  Positive response to DN with improved functional mobility.  ROM gradually improving.  Will begin to add work related movements to aid in return to work.    PT Frequency  3x / week    PT Duration  4 weeks    PT Treatment/Interventions  ADLs/Self Care Home Management;Cryotherapy;Iontophoresis 59m/ml Dexamethasone;Moist Heat;Neuromuscular re-education;Therapeutic exercise;Therapeutic activities;Gait training;Stair training;Functional mobility training;Patient/family education;Taping;Vasopneumatic Device;Passive range of motion;Manual techniques;Dry needling    PT Next Visit Plan  continue progressive ROM / functional strengthening for Lt knee.    PT Home Exercise Plan  Access Code: [email or web address]   Consulted and Agree with Plan of Care  Patient       Patient will benefit from skilled therapeutic intervention in order to improve the following deficits and impairments:  Abnormal gait, Increased fascial restricitons, Decreased range of motion, Decreased strength, Decreased  activity tolerance, Pain, Hypomobility, Impaired flexibility, Increased edema  Visit Diagnosis: Acute pain of left knee  Muscle weakness (generalized)  Other abnormalities of gait and mobility  Localized edema     Problem List Patient Active Problem List   Diagnosis Date Noted  . Status post left knee surgery 06/11/2019  . Primary osteoarthritis of first carpometacarpal joint of left hand 01/26/2019  . Abnormal MRI of head 10/03/2015  . Primary osteoarthritis of left knee 10/26/2013  . Headache 09/30/2013  . Obstructive sleep apnea 7cm water pressure CPAP 10/24/2010  . Rosacea 09/13/2010  . ALLERGIC RHINITIS CAUSE UNSPECIFIED 08/03/2008  . SPONDYLOLISTHESIS, ACQUIRED 05/21/2006  . SCOLIOSIS 12/31/2005  . SHOULDER/ARM SPRAIN/STRAIN, UNSPEC. 12/31/2005   Kerin Perna, PTA 08/18/19 4:05 PM  [facility] [place], [place], [address] Phone: [phone]   Fax:  [phone]  Name: Briana Parks MRN: [id number] Date of Birth: [birth year]

## 2019-08-20 ENCOUNTER — Ambulatory Visit (INDEPENDENT_AMBULATORY_CARE_PROVIDER_SITE_OTHER): Payer: BC Managed Care – PPO | Admitting: Rehabilitative and Restorative Service Providers"

## 2019-08-20 ENCOUNTER — Other Ambulatory Visit: Payer: Self-pay

## 2019-08-20 DIAGNOSIS — M6281 Muscle weakness (generalized): Secondary | ICD-10-CM

## 2019-08-20 DIAGNOSIS — M25562 Pain in left knee: Secondary | ICD-10-CM

## 2019-08-20 DIAGNOSIS — R600 Localized edema: Secondary | ICD-10-CM

## 2019-08-20 DIAGNOSIS — R2689 Other abnormalities of gait and mobility: Secondary | ICD-10-CM | POA: Diagnosis not present

## 2019-08-20 NOTE — Patient Instructions (Signed)
Access Code: [facility] URL: [email or web address] Date: 08/20/2019 Prepared by: Margretta Ditty  Exercises Quad Stretch with [employer] at [employer] - 2 x daily - 7 x weekly - 1 sets - 3 reps - 10 seconds hold Hamstring Stretch at [employer] - 2 x daily - 7 x weekly - 1 sets - 3 reps - 20 seconds hold

## 2019-08-20 NOTE — Therapy (Signed)
[place], [place], [address] Phone: [phone]   Fax:  [phone]  Physical Therapy Treatment  Patient Details  Name: Briana Parks MRN: [id number] Date of Birth: December 31, 1965 Referring Provider (PT): Dorna Leitz, MD   Encounter Date: 08/20/2019  PT End of Session - 08/20/19 1530    Visit Number  20    Number of Visits  30    Date for PT Re-Evaluation  09/14/19    PT Start Time  1528    PT Stop Time  1620    PT Time Calculation (min)  52 min    Activity Tolerance  Patient tolerated treatment well    Behavior During Therapy  [facility] for tasks assessed/performed       Past Medical History:  Diagnosis Date  . ALLERGIC RHINITIS CAUSE UNSPECIFIED 08/03/2008   Qualifier: Diagnosis of  By: Esmeralda Arthur    . Arachnoid cyst   . Deviated septum    large turbinates  . Gestational diabetes   . Headache 09/30/2013  . 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 Kidney infection   . Low blood pressure   . Obstructive sleep apnea 7cm water pressure CPAP 10/24/2010   Moderate. Sleep study was performed at [facility] neurological clinic by Dr. Pecolia Ades on 10/02/2010. AHI was 19.8 with desaturations to 79%. Frequent severe snoring. Moderate wedge works associated with respiratory events. Difficulty initiating and maintaining sleep with no slow wave sleep. She had severe OSA and REM.   . Osteoarthritis of left knee 10/26/2013  . Plantar fasciitis   . Pneumonia    history of  . PONV (postoperative nausea and vomiting)    hard to wake up after anesthesia, did not get PONV after 2017 surgery at [facility]. notes care everywhere  . Recurrent UTI   . Rosacea 09/13/2010  . Scoliosis   . Tinnitus   . Vertigo     Past Surgical History:  Procedure Laterality Date  . bone spur removal Right 02/17/2012   foot  . bone spur removal Left 08/2015  . HERNIA REPAIR Bilateral   . KNEE ARTHROSCOPY Left 03/16/14   [facility]   . TONSILLECTOMY AND ADENOIDECTOMY    . TOTAL KNEE ARTHROPLASTY Left 06/11/2019   Procedure: TOTAL KNEE ARTHROPLASTY;  Surgeon: Dorna Leitz, MD;  Location: [facility];  Service: Orthopedics;  Laterality: Left;  . TUBAL LIGATION      There were no vitals filed for this visit.  Subjective Assessment - 08/20/19 1529    Subjective  The patient got into the water yesterday at a friend's pool.  She is doing ther ex in the home.    Patient Stated Goals  normalize gait    Currently in Pain?  No/denies         [facility] PT Assessment - 08/20/19 1532      Assessment   Medical Diagnosis  L total knee arthroplasty    Referring Provider (PT)  Dorna Leitz, MD    Onset Date/Surgical Date  06/11/19    Hand Dominance  Left    Next MD Visit  09/02/19    Prior Therapy  known to our clinic from prior therapy                    [facility] Adult PT Treatment/Exercise - 08/20/19 1532      Ambulation/Gait   Ambulation/Gait  Yes    Ambulation/Gait Assistance  6:  Modified independent (Device/Increase time)    Ambulation Distance (Feet)  250 Feet    Assistive device  None    Ambulation Surface  Level    Gait Comments  Heel and toe walking x 20 feet x 3 reps, high knee marching with SBA encouraging increased L knee flexion, backwards walking x 20 feet x 4 reps for glut firing.      Exercises   Exercises  Knee/Hip      Knee/Hip Exercises: Stretches   Passive Hamstring Stretch  Left;1 rep;30 seconds    Hip Flexor Stretch  Left;1 rep;30 seconds      Knee/Hip Exercises: Aerobic   Recumbent Bike  full slow revolutions forward and backwards x 4 minutes      Knee/Hip Exercises: Standing   Heel Raises Limitations  heel walking     Other Standing Knee Exercises  squat to buttocks touching 10" step  x 8 reps with UE on back of truck for support and PT holding L tibia to prevent posterior translation    Other Standing Knee Exercises  standing with feet in stride        Knee/Hip Exercises: Seated   Other Seated Knee/Hip Exercises  quadriped rocking for knee flexion,       Knee/Hip Exercises: Supine   Quad Sets  Strengthening;Left;10 reps    Quad Sets Limitations  with patellar holds encouraging proprioceptive feedback of feeling patella move superiorly with quad contraction    Terminal Knee Extension  Strengthening;Left;5 reps      Modalities   Modalities  Vasopneumatic      Vasopneumatic   Number Minutes Vasopneumatic   10 minutes    Vasopnuematic Location   Knee    Vasopneumatic Pressure  Medium    Vasopneumatic Temperature   34 deg      Manual Therapy   Manual Therapy  Joint mobilization;Soft tissue mobilization;Passive ROM;Myofascial release    Manual therapy comments  to gain ROM    Joint Mobilization  patellar mobs, L tibia AP mobilization on femur grade II-III, femur AP mobilization on tibia into extension    Soft tissue mobilization  STM and IASTM L quads and hamstrings with emphasis on lateral HS musculature and posterior knee    Myofascial Release  hamstrings    Passive ROM  overpressure into hip and knee flexion, overpressure into extension             PT Education - 08/20/19 1631    Education Details  described aquatics exercises as patient has accessed a pool on her own    Person(s) Educated  Patient    Methods  Explanation;Handout    Comprehension  Verbalized understanding          PT Long Term Goals - 08/20/19 1633      PT LONG TERM GOAL #1   Title  The patient will be indep with HEP.    Status  Partially Met    Target Date  09/14/19      PT LONG TERM GOAL #2   Title  The patient will report reduced functional limitation per FOTO from 70% to < or equal to 38%.    Status  Achieved      PT LONG TERM GOAL #3   Title  The patient will improve AROM Lt knee to 3-110 degrees.    Baseline  10-92 left knee flexion    Status  On-going      PT LONG TERM GOAL #4   Title  The  patient will improve gait speed to > or  equal to 2.6 ft/sec for return to full community ambulation without a device.    Baseline  2.67 ft/sec    Status  Achieved      PT LONG TERM GOAL #5   Title  The patient will negotiate stairs in reciprocal pattern mod indep with one handrail.    Status  On-going            Plan - 08/20/19 1635    Clinical Impression Statement  The patient continues to have decreased heel strike at initial stance phase and dec'd knee extension at mid stance.  PT worked on [employer], improving active quad engagement, and joint mobilization to promot eimproved motions.  Plan to continue to work to [employer].  Patient notes dec'd tightness after DN earlier this week.    PT Frequency  3x / week    PT Duration  4 weeks    PT Treatment/Interventions  ADLs/Self Care Home Management;Cryotherapy;Iontophoresis '4mg'$ /ml Dexamethasone;Moist Heat;Neuromuscular re-education;Therapeutic exercise;Therapeutic activities;Gait training;Stair training;Functional mobility training;Patient/family education;Taping;Vasopneumatic Device;Passive range of motion;Manual techniques;Dry needling    PT Next Visit Plan  continue progressive ROM / functional strengthening for Lt knee.    PT Home Exercise Plan  Access Code: [email or web address]    Consulted and Agree with Plan of Care  Patient       Patient will benefit from skilled therapeutic intervention in order to improve the following deficits and impairments:  Abnormal gait, Increased fascial restricitons, Decreased range of motion, Decreased strength, Decreased activity tolerance, Pain, Hypomobility, Impaired flexibility, Increased edema  Visit Diagnosis: Acute pain of left knee  Muscle weakness (generalized)  Other abnormalities of gait and mobility  Localized edema     Problem List Patient Active Problem List   Diagnosis Date Noted  . Status post left knee surgery 06/11/2019  . Primary osteoarthritis of first carpometacarpal joint of left hand 01/26/2019  . Abnormal MRI of  head 10/03/2015  . Primary osteoarthritis of left knee 10/26/2013  . Headache 09/30/2013  . Obstructive sleep apnea 7cm water pressure CPAP 10/24/2010  . Rosacea 09/13/2010  . ALLERGIC RHINITIS CAUSE UNSPECIFIED 08/03/2008  . SPONDYLOLISTHESIS, ACQUIRED 05/21/2006  . SCOLIOSIS 12/31/2005  . SHOULDER/ARM SPRAIN/STRAIN, UNSPEC. 12/31/2005    [place], [place] 08/20/2019, 4:37 PM  [facility] [place], [place], [address] Phone: [phone]   Fax:  [phone]  Name: Briana Parks MRN: [id number] Date of Birth: 03-Jan-1966

## 2019-08-25 ENCOUNTER — Ambulatory Visit (INDEPENDENT_AMBULATORY_CARE_PROVIDER_SITE_OTHER): Payer: BC Managed Care – PPO | Admitting: Rehabilitative and Restorative Service Providers"

## 2019-08-25 ENCOUNTER — Encounter: Payer: Self-pay | Admitting: Rehabilitative and Restorative Service Providers"

## 2019-08-25 ENCOUNTER — Other Ambulatory Visit: Payer: Self-pay

## 2019-08-25 DIAGNOSIS — M25562 Pain in left knee: Secondary | ICD-10-CM

## 2019-08-25 DIAGNOSIS — M6281 Muscle weakness (generalized): Secondary | ICD-10-CM

## 2019-08-25 DIAGNOSIS — R600 Localized edema: Secondary | ICD-10-CM | POA: Diagnosis not present

## 2019-08-25 DIAGNOSIS — R2689 Other abnormalities of gait and mobility: Secondary | ICD-10-CM

## 2019-08-25 NOTE — Therapy (Signed)
[place], [place], [address] Phone: [phone]   Fax:  [phone]  Physical Therapy Treatment  Patient Details  Name: Briana Parks MRN: [id number] Date of Birth: [birth year] Referring Provider (PT): Dorna Leitz, MD   Encounter Date: 08/25/2019  PT End of Session - 08/25/19 1523    Visit Number  21    Number of Visits  30    Date for PT Re-Evaluation  09/14/19    PT Start Time  9242   heat/ice post treatment   PT Stop Time  1604    PT Time Calculation (min)  52 min    Activity Tolerance  Patient tolerated treatment well    Behavior During Therapy  [facility] for tasks assessed/performed       Past Medical History:  Diagnosis Date   ALLERGIC RHINITIS CAUSE UNSPECIFIED 08/03/2008   Qualifier: Diagnosis of  By: Valetta Close DO, Karen     Arachnoid cyst    Deviated septum    large turbinates   Gestational diabetes    Headache 09/30/2013   Inguinal hernia    bilateral at birth   Kidney infection    Low blood pressure    Obstructive sleep apnea 7cm water pressure CPAP 10/24/2010   Moderate. Sleep study was performed at [facility] neurological clinic by Dr. Pecolia Ades on 10/02/2010. AHI was 19.8 with desaturations to 79%. Frequent severe snoring. Moderate wedge works associated with respiratory events. Difficulty initiating and maintaining sleep with no slow wave sleep. She had severe OSA and REM.    Osteoarthritis of left knee 10/26/2013   Plantar fasciitis    Pneumonia    history of   PONV (postoperative nausea and vomiting)    hard to wake up after anesthesia, did not get PONV after 2017 surgery at [facility]. notes care everywhere   Recurrent UTI    Rosacea 09/13/2010   Scoliosis    Tinnitus    Vertigo     Past Surgical History:  Procedure Laterality Date   bone spur removal Right 02/17/2012   foot   bone spur removal Left 08/2015   HERNIA REPAIR Bilateral    KNEE  ARTHROSCOPY Left 03/16/14   [facility]    TONSILLECTOMY AND ADENOIDECTOMY     TOTAL KNEE ARTHROPLASTY Left 06/11/2019   Procedure: TOTAL KNEE ARTHROPLASTY;  Surgeon: Dorna Leitz, MD;  [facility];  Service: Orthopedics;  Laterality: Left;   TUBAL LIGATION      There were no vitals filed for this visit.  Subjective Assessment - 08/25/19 1523    Subjective  Knee is doing well - workiong on her HEP - ready for some DN today to see if it will help loosen up the knee    Currently in Pain?  Yes    Pain Score  1     Pain Location  Knee    Pain Orientation  Left    Pain Descriptors / Indicators  Tightness;Sore;Aching    Pain Type  Surgical pain;Chronic pain    Pain Onset  More than a month ago    Pain Frequency  Intermittent         [facility] PT Assessment - 08/25/19 0001      Assessment   Medical Diagnosis  L total knee arthroplasty    Referring Provider (PT)  Dorna Leitz, MD    Onset Date/Surgical Date  06/11/19    Hand Dominance  Left    Next MD Visit  Status  On-going            Plan - 08/25/19 1547    Clinical Impression Statement  Patient reports increasing activity level at home. Tolerated DN through Lt quad and lateral thigh well. Continues to demonstrate limited Lt knee extension with standing and  ambulation. Continued work on activation of quad/strengthening/PROM/stretching Lt.    Rehab Potential  Good    PT Frequency  3x / week    PT Duration  4 weeks    PT Treatment/Interventions  ADLs/Self Care Home Management;Cryotherapy;Iontophoresis '4mg'$ /ml Dexamethasone;Moist Heat;Neuromuscular re-education;Therapeutic exercise;Therapeutic activities;Gait training;Stair training;Functional mobility training;Patient/family education;Taping;Vasopneumatic Device;Passive range of motion;Manual techniques;Dry needling    PT Next Visit Plan  continue progressive ROM / functional strengthening for Lt knee; assess response to DN/manual work.    PT Home Exercise Plan  Access Code: [email or web address]    [email or web address] and Agree with Plan of Care  Patient       Patient will benefit from skilled therapeutic intervention in order to improve the following deficits and impairments:     Visit Diagnosis: Acute pain of left knee  Muscle weakness (generalized)  Other abnormalities of gait and mobility  Localized edema     Problem List Patient Active Problem List   Diagnosis Date Noted   Status post left knee surgery 06/11/2019   Primary osteoarthritis of first carpometacarpal joint of left hand 01/26/2019   Abnormal MRI of head 10/03/2015   Primary osteoarthritis of left knee 10/26/2013   Headache 09/30/2013   Obstructive sleep apnea 7cm water pressure CPAP 10/24/2010   Rosacea 09/13/2010   ALLERGIC RHINITIS CAUSE UNSPECIFIED 08/03/2008   SPONDYLOLISTHESIS, ACQUIRED 05/21/2006   SCOLIOSIS 12/31/2005   SHOULDER/ARM SPRAIN/STRAIN, UNSPEC. 12/31/2005    Ansleigh Safer Nilda Simmer PT, MPH  08/25/2019, 4:53 PM  [facility] [place], [place], [address] Phone: [phone]   Fax:  [phone]  Name: Briana Parks MRN: [id number] Date of Birth: [birth year]  [place], [place], [address] Phone: [phone]   Fax:  [phone]  Physical Therapy Treatment  Patient Details  Name: Briana Parks MRN: [id number] Date of Birth: [birth year] Referring Provider (PT): Dorna Leitz, MD   Encounter Date: 08/25/2019  PT End of Session - 08/25/19 1523    Visit Number  21    Number of Visits  30    Date for PT Re-Evaluation  09/14/19    PT Start Time  9242   heat/ice post treatment   PT Stop Time  1604    PT Time Calculation (min)  52 min    Activity Tolerance  Patient tolerated treatment well    Behavior During Therapy  [facility] for tasks assessed/performed       Past Medical History:  Diagnosis Date   ALLERGIC RHINITIS CAUSE UNSPECIFIED 08/03/2008   Qualifier: Diagnosis of  By: Valetta Close DO, Karen     Arachnoid cyst    Deviated septum    large turbinates   Gestational diabetes    Headache 09/30/2013   Inguinal hernia    bilateral at birth   Kidney infection    Low blood pressure    Obstructive sleep apnea 7cm water pressure CPAP 10/24/2010   Moderate. Sleep study was performed at [facility] neurological clinic by Dr. Pecolia Ades on 10/02/2010. AHI was 19.8 with desaturations to 79%. Frequent severe snoring. Moderate wedge works associated with respiratory events. Difficulty initiating and maintaining sleep with no slow wave sleep. She had severe OSA and REM.    Osteoarthritis of left knee 10/26/2013   Plantar fasciitis    Pneumonia    history of   PONV (postoperative nausea and vomiting)    hard to wake up after anesthesia, did not get PONV after 2017 surgery at [facility]. notes care everywhere   Recurrent UTI    Rosacea 09/13/2010   Scoliosis    Tinnitus    Vertigo     Past Surgical History:  Procedure Laterality Date   bone spur removal Right 02/17/2012   foot   bone spur removal Left 08/2015   HERNIA REPAIR Bilateral    KNEE  ARTHROSCOPY Left 03/16/14   [facility]    TONSILLECTOMY AND ADENOIDECTOMY     TOTAL KNEE ARTHROPLASTY Left 06/11/2019   Procedure: TOTAL KNEE ARTHROPLASTY;  Surgeon: Dorna Leitz, MD;  Location: [facility];  Service: Orthopedics;  Laterality: Left;   TUBAL LIGATION      There were no vitals filed for this visit.  Subjective Assessment - 08/25/19 1523    Subjective  Knee is doing well - workiong on her HEP - ready for some DN today to see if it will help loosen up the knee    Currently in Pain?  Yes    Pain Score  1     Pain Location  Knee    Pain Orientation  Left    Pain Descriptors / Indicators  Tightness;Sore;Aching    Pain Type  Surgical pain;Chronic pain    Pain Onset  More than a month ago    Pain Frequency  Intermittent         [facility] PT Assessment - 08/25/19 0001      Assessment   Medical Diagnosis  L total knee arthroplasty    Referring Provider (PT)  Dorna Leitz, MD    Onset Date/Surgical Date  06/11/19    Hand Dominance  Left    Next MD Visit

## 2019-08-27 ENCOUNTER — Ambulatory Visit (INDEPENDENT_AMBULATORY_CARE_PROVIDER_SITE_OTHER): Payer: BC Managed Care – PPO | Admitting: Rehabilitative and Restorative Service Providers"

## 2019-08-27 ENCOUNTER — Other Ambulatory Visit: Payer: Self-pay

## 2019-08-27 DIAGNOSIS — M25562 Pain in left knee: Secondary | ICD-10-CM

## 2019-08-27 DIAGNOSIS — R2689 Other abnormalities of gait and mobility: Secondary | ICD-10-CM | POA: Diagnosis not present

## 2019-08-27 DIAGNOSIS — R600 Localized edema: Secondary | ICD-10-CM

## 2019-08-27 DIAGNOSIS — M6281 Muscle weakness (generalized): Secondary | ICD-10-CM

## 2019-08-27 NOTE — Therapy (Signed)
[place], [place], [address] Phone: [phone]   Fax:  [phone]  Physical Therapy Treatment  Patient Details  Name: Briana Parks MRN: [id number] Date of Birth: 10/05/1965 Referring Provider (PT): Dorna Leitz, MD   Encounter Date: 08/27/2019  PT End of Session - 08/27/19 1020    Visit Number  22    Number of Visits  30    Date for PT Re-Evaluation  09/14/19    PT Start Time  9509    PT Stop Time  1100    PT Time Calculation (min)  46 min    Activity Tolerance  Patient tolerated treatment well    Behavior During Therapy  [facility] for tasks assessed/performed       Past Medical History:  Diagnosis Date  . ALLERGIC RHINITIS CAUSE UNSPECIFIED 08/03/2008   Qualifier: Diagnosis of  By: Esmeralda Arthur    . Arachnoid cyst   . Deviated septum    large turbinates  . Gestational diabetes   . Headache 09/30/2013  . 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 Kidney infection   . Low blood pressure   . Obstructive sleep apnea 7cm water pressure CPAP 10/24/2010   Moderate. Sleep study was performed at [facility] neurological clinic by Dr. Pecolia Ades on 10/02/2010. AHI was 19.8 with desaturations to 79%. Frequent severe snoring. Moderate wedge works associated with respiratory events. Difficulty initiating and maintaining sleep with no slow wave sleep. She had severe OSA and REM.   . Osteoarthritis of left knee 10/26/2013  . Plantar fasciitis   . Pneumonia    history of  . PONV (postoperative nausea and vomiting)    hard to wake up after anesthesia, did not get PONV after 2017 surgery at [facility]. notes care everywhere  . Recurrent UTI   . Rosacea 09/13/2010  . Scoliosis   . Tinnitus   . Vertigo     Past Surgical History:  Procedure Laterality Date  . bone spur removal Right 02/17/2012   foot  . bone spur removal Left 08/2015  . HERNIA REPAIR Bilateral   . KNEE ARTHROSCOPY Left 03/16/14   [facility]   . TONSILLECTOMY AND ADENOIDECTOMY    . TOTAL KNEE ARTHROPLASTY Left 06/11/2019   Procedure: TOTAL KNEE ARTHROPLASTY;  Surgeon: Dorna Leitz, MD;  [facility]: [facility];  Service: Orthopedics;  Laterality: Left;  . TUBAL LIGATION      There were no vitals filed for this visit.  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    Patient Stated Goals  normalize gait    Currently in Pain?  No/denies         [facility] PT Assessment - 08/27/19 1043      AROM   Left Knee Extension  -9    Left Knee Flexion  95   in supine with knee to chest using gravity to assist flexion                   [facility] Adult PT Treatment/Exercise - 08/27/19 1021      Ambulation/Gait   Ambulation/Gait  Yes    Ambulation/Gait Assistance  6: Modified independent (Device/Increase time)    Ambulation Distance (Feet)  300 Feet    Assistive device  None    Gait Comments  emphasis on L weight shift and maintaining foot  contact to roll over each aspect of foot      Exercises   Exercises  Knee/Hip      Knee/Hip Exercises: Stretches   Quad Stretch  Left;2 reps;20 seconds      Knee/Hip Exercises: Aerobic   Recumbent Bike  4 minutes of bike revolutions low resistance (recumbent)      Knee/Hip Exercises: Standing   Heel Raises  Both   12 reps   Forward Step Up  Left;10 reps    Step Down  10 reps;Left    Wall Squat  10 reps    SLS  L SLS    Other Standing Knee Exercises  standing terminal stance into a ball for tactile cues; standing L pillowcase slides posteriorly for L knee extension cues.      Knee/Hip Exercises: Prone   Hamstring Curl  5 reps    Hamstring Curl Limitations  with contract/relax and isometric holds for end range muscle activation    Hip Extension  Strengthening;Left;10 reps      Modalities   Modalities  Cryotherapy      Cryotherapy   Number Minutes Cryotherapy  8 Minutes     Cryotherapy Location  Knee    Type of Cryotherapy  Ice massage      Manual Therapy   Manual Therapy  Soft tissue mobilization;Joint mobilization    Manual therapy comments  to gain ROM    Joint Mobilization  patellar mobilization laterally and superiorly    Soft tissue mobilization  STM and IASTM L quad and IT Band    Passive ROM  overpressure into knee flexion and knee etension                  PT Long Term Goals - 08/20/19 1633      PT LONG TERM GOAL #1   Title  The patient will be indep with HEP.    Status  Partially Met    Target Date  09/14/19      PT LONG TERM GOAL #2   Title  The patient will report reduced functional limitation per FOTO from 70% to < or equal to 38%.    Status  Achieved      PT LONG TERM GOAL #3   Title  The patient will improve AROM Lt knee to 3-110 degrees.    Baseline  10-92 left knee flexion    Status  On-going      PT LONG TERM GOAL #4   Title  The patient will improve gait speed to > or equal to 2.6 ft/sec for return to full community ambulation without a device.    Baseline  2.67 ft/sec    Status  Achieved      PT LONG TERM GOAL #5   Title  The patient will negotiate stairs in reciprocal pattern mod indep with one handrail.    Status  On-going            Plan - 08/27/19 1719    Clinical Impression Statement  The patient continues with ROM limitations hindering normal gait mechanics.  PT to continue to progress to patient tolerance.  She feels DN has improved mobility to allow for improved quad contraction.    Rehab Potential  Good    PT Frequency  3x / week    PT Duration  4 weeks    PT Treatment/Interventions  ADLs/Self Care Home Management;Cryotherapy;Iontophoresis '4mg'$ /ml Dexamethasone;Moist Heat;Neuromuscular re-education;Therapeutic exercise;Therapeutic activities;Gait training;Stair training;Functional mobility training;Patient/family education;Taping;Vasopneumatic Device;Passive range of motion;Manual techniques;Dry  needling    PT Next Visit Plan  continue progressive ROM / functional strengthening for Lt knee; assess response to DN/manual work.    PT Home Exercise Plan  Access Code: [email or web address]    Consulted and Agree with Plan of Care  Patient       Patient will benefit from skilled therapeutic intervention in order to improve the following deficits and impairments:  Abnormal gait, Increased fascial restricitons, Decreased range of motion, Decreased strength, Decreased activity tolerance, Pain, Hypomobility, Impaired flexibility, Increased edema  Visit Diagnosis: Acute pain of left knee  Muscle weakness (generalized)  Other abnormalities of gait and mobility  Localized edema     Problem List Patient Active Problem List   Diagnosis Date Noted  . Status post left knee surgery 06/11/2019  . Primary osteoarthritis of first carpometacarpal joint of left hand 01/26/2019  . Abnormal MRI of head 10/03/2015  . Primary osteoarthritis of left knee 10/26/2013  . Headache 09/30/2013  . Obstructive sleep apnea 7cm water pressure CPAP 10/24/2010  . Rosacea 09/13/2010  . ALLERGIC RHINITIS CAUSE UNSPECIFIED 08/03/2008  . SPONDYLOLISTHESIS, ACQUIRED 05/21/2006  . SCOLIOSIS 12/31/2005  . SHOULDER/ARM SPRAIN/STRAIN, UNSPEC. 12/31/2005    [place], PT 08/27/2019, 5:25 PM  [facility] [place], [place], [address] Phone: [phone]   Fax:  [phone]  Name: Briana Parks MRN: [id number] Date of Birth: 1965/08/09

## 2019-09-01 ENCOUNTER — Ambulatory Visit (INDEPENDENT_AMBULATORY_CARE_PROVIDER_SITE_OTHER): Payer: BC Managed Care – PPO | Admitting: Rehabilitative and Restorative Service Providers"

## 2019-09-01 ENCOUNTER — Other Ambulatory Visit: Payer: Self-pay

## 2019-09-01 ENCOUNTER — Encounter: Payer: Self-pay | Admitting: Rehabilitative and Restorative Service Providers"

## 2019-09-01 DIAGNOSIS — M25562 Pain in left knee: Secondary | ICD-10-CM | POA: Diagnosis not present

## 2019-09-01 DIAGNOSIS — R2689 Other abnormalities of gait and mobility: Secondary | ICD-10-CM

## 2019-09-01 DIAGNOSIS — R600 Localized edema: Secondary | ICD-10-CM | POA: Diagnosis not present

## 2019-09-01 DIAGNOSIS — M6281 Muscle weakness (generalized): Secondary | ICD-10-CM | POA: Diagnosis not present

## 2019-09-01 NOTE — Therapy (Signed)
[facility] [place] [address] [place] [address] [place], [place], [address] Phone: [phone]   Fax:  [phone]  Physical Therapy Treatment  Patient Details  Name: Briana Parks MRN: [id number] Date of Birth: Jun 06, 1965 Referring Provider (PT): Jodi Geralds, MD   Encounter Date: 09/01/2019  PT End of Session - 09/01/19 1523    Visit Number  23    Number of Visits  30    Date for PT Re-Evaluation  09/14/19    PT Start Time  1512    PT Stop Time  1600    PT Time Calculation (min)  48 min    Activity Tolerance  Patient tolerated treatment well       Past Medical History:  Diagnosis Date  . ALLERGIC RHINITIS CAUSE UNSPECIFIED 08/03/2008   Qualifier: Diagnosis of  By: Thomos Lemons    . Arachnoid cyst   . Deviated septum    large turbinates  . Gestational diabetes   . Headache 09/30/2013  . 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 Kidney infection   . Low blood pressure   . Obstructive sleep apnea 7cm water pressure CPAP 10/24/2010   Moderate. Sleep study was performed at [facility] neurological clinic by Dr. Marliss Coots on 10/02/2010. AHI was 19.8 with desaturations to 79%. Frequent severe snoring. Moderate wedge works associated with respiratory events. Difficulty initiating and maintaining sleep with no slow wave sleep. She had severe OSA and REM.   . Osteoarthritis of left knee 10/26/2013  . Plantar fasciitis   . Pneumonia    history of  . PONV (postoperative nausea and vomiting)    hard to wake up after anesthesia, did not get PONV after 2017 surgery at [facility]. notes care everywhere  . Recurrent UTI   . Rosacea 09/13/2010  . Scoliosis   . Tinnitus   . Vertigo     Past Surgical History:  Procedure Laterality Date  . bone spur removal Right 02/17/2012   foot  . bone spur removal Left 08/2015  . HERNIA REPAIR Bilateral   . KNEE ARTHROSCOPY Left 03/16/14   [facility]   . TONSILLECTOMY AND  ADENOIDECTOMY    . TOTAL KNEE ARTHROPLASTY Left 06/11/2019   Procedure: TOTAL KNEE ARTHROPLASTY;  Surgeon: Jodi Geralds, MD;  Location: [facility];  Service: Orthopedics;  Laterality: Left;  . TUBAL LIGATION      There were no vitals filed for this visit.  Subjective Assessment - 09/01/19 1525    Subjective  Cotninues to work on exercise at home. No pain. Feels she needs to work on bending )but does not have full  extension)    Currently in Pain?  No/denies         [facility] PT Assessment - 09/01/19 0001      Assessment   Medical Diagnosis  L total knee arthroplasty    Referring Provider (PT)  Jodi Geralds, MD    Onset Date/Surgical Date  06/11/19    Hand Dominance  Left    Next MD Visit  09/02/19    Prior Therapy  known to our clinic from prior therapy      AROM   Left Knee Extension  -8    Left Knee Flexion  98   AAROM - supine pt assisting with strap      PROM   Left Knee Extension  -3    Left Knee Flexion  98      Strength   Left  Knee Flexion  5/5    Left Knee Extension  5/5      Flexibility   Quadriceps  Lt 90 deg with strap assist       Palpation   Patella mobility  hypomobility L patella    Palpation comment  tightness noted through the Lt quads and hamstrings; peripatellar area       Functional Gait  Assessment   Gait assessed   --   continued limited Lt knee ext creating limp Lt LE                    [facility] Adult PT Treatment/Exercise - 09/01/19 0001      Ambulation/Gait   Ambulation/Gait  Yes    Ambulation/Gait Assistance  6: Modified independent (Device/Increase time)   increased time    Ambulation Distance (Feet)  300 Feet    Assistive device  None    Ambulation Surface  Level    Gait Comments  emphasis on L weight shift; straightening Lt knee and maintaining foot contact to roll over each aspect of foot      Knee/Hip Exercises: Stretches   Physiological scientist reps;20 seconds    Gastroc Stretch  Both;2 reps;30 seconds   incline surface       Knee/Hip Exercises: Aerobic   Recumbent Bike  6 minutes L2       Knee/Hip Exercises: Supine   Quad Sets  Strengthening;Left;1 set;10 reps   heel propped on foam roll      Modalities   Modalities  Cryotherapy      Cryotherapy   Number Minutes Cryotherapy  12 Minutes    Cryotherapy Location  Knee    Type of Cryotherapy  Ice pack      Manual Therapy   Manual Therapy  Soft tissue mobilization;Joint mobilization    Manual therapy comments  skilled palpation to monitor tissue response to DN and manual work     Joint Mobilization  patellar mobilization laterally and superiorly    Soft tissue mobilization  dee ptissue work through [employer] quad and hamstrings     Myofascial Release  quads/hamstrings    Passive ROM  overpressure into knee flexion and knee etension       Trigger Point Dry Needling - 09/01/19 0001    Consent Given?  Yes    Education Handout Provided  Previously provided    Dry Needling Comments  Lt    Quadriceps Response  Twitch response elicited;Palpable increased muscle length    Hamstring Response  Palpable increased muscle length;Twitch response elicited           PT Education - 09/01/19 1708    Education Details  encouraged patient to work on end range extension and flexion with hold at maximum tolerance at end range    Person(s) Educated  Patient    Methods  Explanation;Demonstration;Tactile cues;Verbal cues    Comprehension  Verbalized understanding          PT Long Term Goals - 08/20/19 1633      PT LONG TERM GOAL #1   Title  The patient will be indep with HEP.    Status  Partially Met    Target Date  09/14/19      PT LONG TERM GOAL #2   Title  The patient will report reduced functional limitation per FOTO from 70% to < or equal to 38%.    Status  Achieved      PT LONG TERM GOAL #3  Title  The patient will improve AROM Lt knee to 3-110 degrees.    Baseline  10-92 left knee flexion    Status  On-going      PT LONG TERM GOAL #4   Title  The  patient will improve gait speed to > or equal to 2.6 ft/sec for return to full community ambulation without a device.    Baseline  2.67 ft/sec    Status  Achieved      PT LONG TERM GOAL #5   Title  The patient will negotiate stairs in reciprocal pattern mod indep with one handrail.    Status  On-going            Plan - 09/01/19 1709    Clinical Impression Statement  Patient demonstrates gradual improvement in knee ROM and good gains in LE strength following Lt TKA. She continues to have limited terminal knee extension which creates abnormal gait pattern and poor posture and alignment in standing. Patient has limited knee flexion but did achieve 98 degrees AAROM today. She tolerates DN and manual work well with some release of tissue tightness following. Patient will benefit from continued treatment with focus on full knee extension.    Rehab Potential  Good    PT Frequency  3x / week    PT Duration  4 weeks    PT Treatment/Interventions  ADLs/Self Care Home Management;Cryotherapy;Iontophoresis 4mg /ml Dexamethasone;Moist Heat;Neuromuscular re-education;Therapeutic exercise;Therapeutic activities;Gait training;Stair training;Functional mobility training;Patient/family education;Taping;Vasopneumatic Device;Passive range of motion;Manual techniques;Dry needling    PT Next Visit Plan  continue progressive ROM / functional strengthening for Lt knee; assess response to DN/manual work.    PT Home Exercise Plan  Access Code: [email or web address]    Consulted and Agree with Plan of Care  Patient       Patient will benefit from skilled therapeutic intervention in order to improve the following deficits and impairments:     Visit Diagnosis: Acute pain of left knee  Muscle weakness (generalized)  Other abnormalities of gait and mobility  Localized edema     Problem List Patient Active Problem List   Diagnosis Date Noted  . Status post left knee surgery 06/11/2019  . Primary osteoarthritis of  first carpometacarpal joint of left hand 01/26/2019  . Abnormal MRI of head 10/03/2015  . Primary osteoarthritis of left knee 10/26/2013  . Headache 09/30/2013  . Obstructive sleep apnea 7cm water pressure CPAP 10/24/2010  . Rosacea 09/13/2010  . ALLERGIC RHINITIS CAUSE UNSPECIFIED 08/03/2008  . SPONDYLOLISTHESIS, ACQUIRED 05/21/2006  . SCOLIOSIS 12/31/2005  . SHOULDER/ARM SPRAIN/STRAIN, UNSPEC. 12/31/2005    Carlitos Bottino Rober Minion PT, MPH  09/01/2019, 5:13 PM  [facility] [address] [place] [address] [place], [place], [address] Phone: [phone]   Fax:  [phone]  Name: Briana Parks MRN: [id number] Date of Birth: 10-12-1965

## 2019-09-03 ENCOUNTER — Ambulatory Visit (INDEPENDENT_AMBULATORY_CARE_PROVIDER_SITE_OTHER): Payer: BC Managed Care – PPO | Admitting: Physical Therapy

## 2019-09-03 ENCOUNTER — Other Ambulatory Visit: Payer: Self-pay

## 2019-09-03 DIAGNOSIS — R2689 Other abnormalities of gait and mobility: Secondary | ICD-10-CM | POA: Diagnosis not present

## 2019-09-03 DIAGNOSIS — M25562 Pain in left knee: Secondary | ICD-10-CM | POA: Diagnosis not present

## 2019-09-03 DIAGNOSIS — R600 Localized edema: Secondary | ICD-10-CM | POA: Diagnosis not present

## 2019-09-03 DIAGNOSIS — M6281 Muscle weakness (generalized): Secondary | ICD-10-CM

## 2019-09-03 NOTE — Therapy (Signed)
[facility] [place] [address] [place] [address] [place], [place], [address] Phone: [phone]   Fax:  [phone]  Physical Therapy Treatment  Patient Details  Name: Briana Parks MRN: [id number] Date of Birth: [birth year] Referring Provider (PT): Jodi Geralds, MD   Encounter Date: 09/03/2019   PT End of Session - 09/03/19 1019    Visit Number 24    Number of Visits 30    Date for PT Re-Evaluation 09/14/19    PT Start Time 0934    PT Stop Time 1023   ice/heat last 10 min   PT Time Calculation (min) 49 min    Activity Tolerance Patient tolerated treatment well           Past Medical History:  Diagnosis Date   ALLERGIC RHINITIS CAUSE UNSPECIFIED 08/03/2008   Qualifier: Diagnosis of  By: Cathey Endow DO, Karen     Arachnoid cyst    Deviated septum    large turbinates   Gestational diabetes    Headache 09/30/2013   Inguinal hernia    bilateral at birth   Kidney infection    Low blood pressure    Obstructive sleep apnea 7cm water pressure CPAP 10/24/2010   Moderate. Sleep study was performed at [facility] neurological clinic by Dr. Marliss Coots on 10/02/2010. AHI was 19.8 with desaturations to 79%. Frequent severe snoring. Moderate wedge works associated with respiratory events. Difficulty initiating and maintaining sleep with no slow wave sleep. She had severe OSA and REM.    Osteoarthritis of left knee 10/26/2013   Plantar fasciitis    Pneumonia    history of   PONV (postoperative nausea and vomiting)    hard to wake up after anesthesia, did not get PONV after 2017 surgery at [facility]. notes care everywhere   Recurrent UTI    Rosacea 09/13/2010   Scoliosis    Tinnitus    Vertigo     Past Surgical History:  Procedure Laterality Date   bone spur removal Right 02/17/2012   foot   bone spur removal Left 08/2015   HERNIA REPAIR Bilateral    KNEE ARTHROSCOPY Left 03/16/14   [facility]     TONSILLECTOMY AND ADENOIDECTOMY     TOTAL KNEE ARTHROPLASTY Left 06/11/2019   Procedure: TOTAL KNEE ARTHROPLASTY;  Surgeon: Jodi Geralds, MD;  Location: [facility];  Service: Orthopedics;  Laterality: Left;   TUBAL LIGATION      There were no vitals filed for this visit.   Subjective Assessment - 09/03/19 0940    Subjective Pt reports she saw surgeon yesterday. She was released to return to work.  He said, "It's looking good. You'll see a big difference when you hit 105 (deg)" per pt report and to continue PT.   She reports her quad set has improved.    Currently in Pain? No/denies    Pain Score 0-No pain              [facility] PT Assessment - 09/03/19 0001      Assessment   Medical Diagnosis L total knee arthroplasty    Referring Provider (PT) Jodi Geralds, MD    Onset Date/Surgical Date 06/11/19    Hand Dominance Left    Next MD Visit 10/14/19    Prior Therapy known to our clinic from prior therapy             [facility] Adult PT Treatment/Exercise - 09/03/19 0001      Knee/Hip Exercises: Stretches   Gastroc  Stretch Both;3 reps;20 seconds   incline surface      Knee/Hip Exercises: Aerobic   Tread Mill retro gait x 3 min for TKE at -0.7 mph    Nustep L4: 5 min for warm up and ROM      Knee/Hip Exercises: Standing   Terminal Knee Extension Strengthening;Left;1 set;10 reps;Theraband    Theraband Level (Terminal Knee Extension) Level 3 (Green)    Step Down Right;Step Height: 4";Hand Hold: 2;15 reps   retro step up LLE     Knee/Hip Exercises: Supine   Quad Sets Strengthening;Left;1 set;10 reps   heel propped on foam roll    Quad Sets Limitations initially quad not firing; moved to long sitting - improved with repetition      Knee/Hip Exercises: Prone   Hamstring Curl 5 reps;3 sets   minimal lift, in between prone hang reps.    Prone Knee Hang 1 minute   3 reps   Prone Knee Hang Weights (lbs) 2 lb cuff weight      Moist Heat Therapy   Number Minutes Moist Heat 10 Minutes     Moist Heat Location Knee   Lt posterior     Cryotherapy   Number Minutes Cryotherapy 10 Minutes    Cryotherapy Location Knee   Lt ant   Type of Cryotherapy Ice pack      Manual Therapy   Joint Mobilization patellar mobilization laterally and superiorly    Soft tissue mobilization IASTM and STM to Lt hamstring and calf to decrease fascial restrictions and improve ROM    Passive ROM overpressure into Lt knee ext (heel propped)                       PT Long Term Goals - 08/20/19 1633      PT LONG TERM GOAL #1   Title The patient will be indep with HEP.    Status Partially Met    Target Date 09/14/19      PT LONG TERM GOAL #2   Title The patient will report reduced functional limitation per FOTO from 70% to < or equal to 38%.    Status Achieved      PT LONG TERM GOAL #3   Title The patient will improve AROM Lt knee to 3-110 degrees.    Baseline 10-92 left knee flexion    Status On-going      PT LONG TERM GOAL #4   Title The patient will improve gait speed to > or equal to 2.6 ft/sec for return to full community ambulation without a device.    Baseline 2.67 ft/sec    Status Achieved      PT LONG TERM GOAL #5   Title The patient will negotiate stairs in reciprocal pattern mod indep with one handrail.    Status On-going                 Plan - 09/03/19 1236    Clinical Impression Statement Focus during session was improving Lt knee ext ROM and quad firing.  Continued weak and slow contraction of Lt quad; required cues and tactile feedback to initiate.  Pt tolerated treatment well.  Progressing gradually towards remaining goals.    Rehab Potential Good    PT Frequency 3x / week    PT Duration 4 weeks    PT Treatment/Interventions ADLs/Self Care Home Management;Cryotherapy;Iontophoresis '4mg'$ /ml Dexamethasone;Moist Heat;Neuromuscular re-education;Therapeutic exercise;Therapeutic activities;Gait training;Stair training;Functional mobility training;Patient/family  education;Taping;Vasopneumatic Device;Passive range of motion;Manual techniques;Dry needling  PT Next Visit Plan continue progressive ROM / functional strengthening for Lt knee; manual therapy/ DN as indicated.    PT Home Exercise Plan Access Code: [email or web address]    [email or web address] and Agree with Plan of Care Patient           Patient will benefit from skilled therapeutic intervention in order to improve the following deficits and impairments:  Abnormal gait, Increased fascial restricitons, Decreased range of motion, Decreased strength, Decreased activity tolerance, Pain, Hypomobility, Impaired flexibility, Increased edema  Visit Diagnosis: Acute pain of left knee  Muscle weakness (generalized)  Other abnormalities of gait and mobility  Localized edema     Problem List Patient Active Problem List   Diagnosis Date Noted   Status post left knee surgery 06/11/2019   Primary osteoarthritis of first carpometacarpal joint of left hand 01/26/2019   Abnormal MRI of head 10/03/2015   Primary osteoarthritis of left knee 10/26/2013   Headache 09/30/2013   Obstructive sleep apnea 7cm water pressure CPAP 10/24/2010   Rosacea 09/13/2010   ALLERGIC RHINITIS CAUSE UNSPECIFIED 08/03/2008   SPONDYLOLISTHESIS, ACQUIRED 05/21/2006   SCOLIOSIS 12/31/2005   SHOULDER/ARM SPRAIN/STRAIN, UNSPEC. 12/31/2005   Kerin Perna, PTA 09/03/19 12:40 PM  [place], [place], [address] Phone: [phone]   Fax:  [phone]  Name: NICKY MILHOUSE MRN: [id number] Date of Birth: Feb 18, 1966

## 2019-09-08 ENCOUNTER — Encounter: Payer: Self-pay | Admitting: Physical Therapy

## 2019-09-08 ENCOUNTER — Ambulatory Visit (INDEPENDENT_AMBULATORY_CARE_PROVIDER_SITE_OTHER): Payer: BC Managed Care – PPO | Admitting: Physical Therapy

## 2019-09-08 ENCOUNTER — Other Ambulatory Visit: Payer: Self-pay

## 2019-09-08 DIAGNOSIS — M25562 Pain in left knee: Secondary | ICD-10-CM

## 2019-09-08 DIAGNOSIS — M6281 Muscle weakness (generalized): Secondary | ICD-10-CM | POA: Diagnosis not present

## 2019-09-08 DIAGNOSIS — R600 Localized edema: Secondary | ICD-10-CM | POA: Diagnosis not present

## 2019-09-08 DIAGNOSIS — R2689 Other abnormalities of gait and mobility: Secondary | ICD-10-CM | POA: Diagnosis not present

## 2019-09-08 NOTE — Therapy (Signed)
[place], [place], [address] Phone: [phone]   Fax:  [phone]  Physical Therapy Treatment  Patient Details  Name: Briana Parks MRN: [id number] Date of Birth: September 27, 1965 Referring Provider (PT): Dorna Leitz, MD   Encounter Date: 09/08/2019   PT End of Session - 09/08/19 1102    Visit Number 25    Number of Visits 30    Date for PT Re-Evaluation 09/14/19    PT Start Time 1103    PT Stop Time 1153    PT Time Calculation (min) 50 min    Activity Tolerance Patient tolerated treatment well    Behavior During Therapy [facility] for tasks assessed/performed           Past Medical History:  Diagnosis Date  . ALLERGIC RHINITIS CAUSE UNSPECIFIED 08/03/2008   Qualifier: Diagnosis of  By: Esmeralda Arthur    . Arachnoid cyst   . Deviated septum    large turbinates  . Gestational diabetes   . Headache 09/30/2013  . 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 Kidney infection   . Low blood pressure   . Obstructive sleep apnea 7cm water pressure CPAP 10/24/2010   Moderate. Sleep study was performed at [facility] neurological clinic by Dr. Pecolia Ades on 10/02/2010. AHI was 19.8 with desaturations to 79%. Frequent severe snoring. Moderate wedge works associated with respiratory events. Difficulty initiating and maintaining sleep with no slow wave sleep. She had severe OSA and REM.   . Osteoarthritis of left knee 10/26/2013  . Plantar fasciitis   . Pneumonia    history of  . PONV (postoperative nausea and vomiting)    hard to wake up after anesthesia, did not get PONV after 2017 surgery at [facility] notes care everywhere  . Recurrent UTI   . Rosacea 09/13/2010  . Scoliosis   . Tinnitus   . Vertigo     Past Surgical History:  Procedure Laterality Date  . bone spur removal Right 02/17/2012   foot  . bone spur removal Left 08/2015  . HERNIA REPAIR Bilateral   . KNEE ARTHROSCOPY Left 03/16/14   [facility]   . TONSILLECTOMY AND ADENOIDECTOMY    . TOTAL KNEE ARTHROPLASTY Left 06/11/2019   Procedure: TOTAL KNEE ARTHROPLASTY;  Surgeon: Dorna Leitz, MD;  Location: [facility];  Service: Orthopedics;  Laterality: Left;  . TUBAL LIGATION      There were no vitals filed for this visit.   Subjective Assessment - 09/08/19 1103    Subjective Pt reports her knee is doing well today, she is having allergies and sinus stuff.    Patient Stated Goals normalize gait    Currently in Pain? No/denies              [facility] PT Assessment - 09/08/19 0001      Assessment   Medical Diagnosis L total knee arthroplasty    Referring Provider (PT) Dorna Leitz, MD                         [facility] Adult PT Treatment/Exercise - 09/08/19 0001      Knee/Hip Exercises: Stretches   Passive Hamstring Stretch Left;3 reps;30 seconds    Passive Hamstring Stretch Limitations with UE overpressure on lower thigh, VC to keep chest elevated     Quad Stretch Left;3 reps;30 seconds   prone with strap   ITB Stretch Left;3 reps;30 seconds  cross body with strap     Knee/Hip Exercises: Aerobic   Nustep L4: 5 min for warm up and ROM      Knee/Hip Exercises: Machines for Strengthening   Cybex Knee Extension 3x8 2 plates    Total Gym Leg Press 3x8 6 plates, VC for form      Knee/Hip Exercises: Standing   Lateral Step Up Left;2 sets;10 reps;Step Height: 8"    Forward Step Up Left;3 sets;10 reps;Step Height: 8"   with balance at top     Knee/Hip Exercises: Seated   Other Seated Knee/Hip Exercises slef knee flexion mobs Lt with scooting      Knee/Hip Exercises: Sidelying   Clams 2x10 regular and reverse with 3#       Cryotherapy   Number Minutes Cryotherapy 10 Minutes    Cryotherapy Location Knee    Type of Cryotherapy Ice pack                       PT Long Term Goals - 08/20/19 1633      PT LONG TERM GOAL #1   Title The patient will be indep with HEP.    Status  Partially Met    Target Date 09/14/19      PT LONG TERM GOAL #2   Title The patient will report reduced functional limitation per FOTO from 70% to < or equal to 38%.    Status Achieved      PT LONG TERM GOAL #3   Title The patient will improve AROM Lt knee to 3-110 degrees.    Baseline 10-92 left knee flexion    Status On-going      PT LONG TERM GOAL #4   Title The patient will improve gait speed to > or equal to 2.6 ft/sec for return to full community ambulation without a device.    Baseline 2.67 ft/sec    Status Achieved      PT LONG TERM GOAL #5   Title The patient will negotiate stairs in reciprocal pattern mod indep with one handrail.    Status On-going                 Plan - 09/08/19 1142    Clinical Impression Statement Rebeca was able to tolerated more strengthening work today.  Did fatigue, She is also doing well with flexiblity and Lt knee ROM work today.  She requested ice only on knee today and hold heat to back of knee.    Rehab Potential Good    PT Frequency 3x / week    PT Duration 4 weeks    PT Treatment/Interventions ADLs/Self Care Home Management;Cryotherapy;Iontophoresis 31m/ml Dexamethasone;Moist Heat;Neuromuscular re-education;Therapeutic exercise;Therapeutic activities;Gait training;Stair training;Functional mobility training;Patient/family education;Taping;Vasopneumatic Device;Passive range of motion;Manual techniques;Dry needling    PT Next Visit Plan continue progressive ROM / functional strengthening for Lt knee; manual therapy/ DN as indicated.           Patient will benefit from skilled therapeutic intervention in order to improve the following deficits and impairments:  Abnormal gait, Increased fascial restricitons, Decreased range of motion, Decreased strength, Decreased activity tolerance, Pain, Hypomobility, Impaired flexibility, Increased edema  Visit Diagnosis: Acute pain of left knee  Muscle weakness (generalized)  Other  abnormalities of gait and mobility  Localized edema     Problem List Patient Active Problem List   Diagnosis Date Noted  . Status post left knee surgery 06/11/2019  . Primary osteoarthritis of first carpometacarpal joint of left  hand 01/26/2019  . Abnormal MRI of head 10/03/2015  . Primary osteoarthritis of left knee 10/26/2013  . Headache 09/30/2013  . Obstructive sleep apnea 7cm water pressure CPAP 10/24/2010  . Rosacea 09/13/2010  . ALLERGIC RHINITIS CAUSE UNSPECIFIED 08/03/2008  . SPONDYLOLISTHESIS, ACQUIRED 05/21/2006  . SCOLIOSIS 12/31/2005  . SHOULDER/ARM SPRAIN/STRAIN, UNSPEC. 12/31/2005    Jeral Pinch PT  09/08/2019, 11:44 AM  [facility] [place], [place], [address] Phone: [phone]   Fax:  [phone]  Name: ADELISA SATTERWHITE MRN: [id number] Date of Birth: 1965-07-19

## 2019-09-10 ENCOUNTER — Ambulatory Visit (INDEPENDENT_AMBULATORY_CARE_PROVIDER_SITE_OTHER): Payer: BC Managed Care – PPO | Admitting: Physical Therapy

## 2019-09-10 DIAGNOSIS — M25562 Pain in left knee: Secondary | ICD-10-CM | POA: Diagnosis not present

## 2019-09-10 DIAGNOSIS — R600 Localized edema: Secondary | ICD-10-CM | POA: Diagnosis not present

## 2019-09-10 DIAGNOSIS — M6281 Muscle weakness (generalized): Secondary | ICD-10-CM | POA: Diagnosis not present

## 2019-09-10 DIAGNOSIS — R2689 Other abnormalities of gait and mobility: Secondary | ICD-10-CM | POA: Diagnosis not present

## 2019-09-10 NOTE — Therapy (Signed)
[place], [place], [address] Phone: [phone]   Fax:  [phone]  Physical Therapy Treatment  Patient Details  Name: Briana Parks MRN: [id number] Date of Birth: 1965-05-11 Referring Provider (PT): Dorna Leitz, MD   Encounter Date: 09/10/2019   PT End of Session - 09/10/19 1022    Visit Number 26    Number of Visits 30    Date for PT Re-Evaluation 09/14/19    PT Start Time 1019    PT Stop Time 1108   ice/heat last 10 min   PT Time Calculation (min) 49 min    Activity Tolerance Patient tolerated treatment well           Past Medical History:  Diagnosis Date  . ALLERGIC RHINITIS CAUSE UNSPECIFIED 08/03/2008   Qualifier: Diagnosis of  By: Esmeralda Arthur    . Arachnoid cyst   . Deviated septum    large turbinates  . Gestational diabetes   . Headache 09/30/2013  . 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 Kidney infection   . Low blood pressure   . Obstructive sleep apnea 7cm water pressure CPAP 10/24/2010   Moderate. Sleep study was performed at [facility] neurological clinic by Dr. Pecolia Ades on 10/02/2010. AHI was 19.8 with desaturations to 79%. Frequent severe snoring. Moderate wedge works associated with respiratory events. Difficulty initiating and maintaining sleep with no slow wave sleep. She had severe OSA and REM.   . Osteoarthritis of left knee 10/26/2013  . Plantar fasciitis   . Pneumonia    history of  . PONV (postoperative nausea and vomiting)    hard to wake up after anesthesia, did not get PONV after 2017 surgery at [facility]. notes care everywhere  . Recurrent UTI   . Rosacea 09/13/2010  . Scoliosis   . Tinnitus   . Vertigo     Past Surgical History:  Procedure Laterality Date  . bone spur removal Right 02/17/2012   foot  . bone spur removal Left 08/2015  . HERNIA REPAIR Bilateral   . KNEE ARTHROSCOPY Left 03/16/14   [facility]   .  TONSILLECTOMY AND ADENOIDECTOMY    . TOTAL KNEE ARTHROPLASTY Left 06/11/2019   Procedure: TOTAL KNEE ARTHROPLASTY;  Surgeon: Dorna Leitz, MD;  Location: [facility];  Service: Orthopedics;  Laterality: Left;  . TUBAL LIGATION      There were no vitals filed for this visit.   Subjective Assessment - 09/10/19 1023    Subjective Pt reports no new changes with knee.    Patient Stated Goals normalize gait    Currently in Pain? No/denies    Pain Score 0-No pain              [facility] PT Assessment - 09/10/19 0001      Assessment   Medical Diagnosis L total knee arthroplasty    Referring Provider (PT) Dorna Leitz, MD    Onset Date/Surgical Date 06/11/19    Hand Dominance Left    Next MD Visit 10/14/19    Prior Therapy known to our clinic from prior therapy      PROM   Left Knee Extension -5    Left Knee Flexion 104   during assisted heel slide           [facility] Adult PT Treatment/Exercise - 09/10/19 0001      Knee/Hip Exercises: Stretches   Passive Hamstring Stretch Left;1 rep;30 seconds  supine with strap   Hip Flexor Stretch Left;3 rep;20 seconds   thomas position   Knee: Self-Stretch to increase Flexion Left;5 reps;10 seconds   lunging forward on 12" step with BUE support @ rail   Knee: Self-Stretch Limitations followed by Lt hamstring stretch with overpressure to thigh x 5 reps of 10 sec     Gastroc Stretch Both;2 reps;20 seconds   incline board     Knee/Hip Exercises: Aerobic   Tread Mill retro gait x 3 min for TKE at -0.7 mph    Nustep L5: 3.5 min       Knee/Hip Exercises: Machines for Strengthening   Total Gym Leg Press 10 x 2 set - 7 plates    3 sec hold in ext (quad set)     Knee/Hip Exercises: Standing   Terminal Knee Extension Left;1 set;10 reps   knee against ball at wall   Stairs 10 - 6" steps ascending/descnding with reciprocal pattern and no UE support     Other Standing Knee Exercises deep squat to sit on 10" stool (UE support on sink) for increased knee flexion x  10 reps       Knee/Hip Exercises: Supine   Heel Slides AAROM   1 rep with strap for measurement     Moist Heat Therapy   Number Minutes Moist Heat 10 Minutes    Moist Heat Location Knee   Lt posterior     Cryotherapy   Number Minutes Cryotherapy 10 Minutes    Cryotherapy Location Knee   Lt    Type of Cryotherapy Ice pack   Ant knee     Manual Therapy   Passive ROM overpressure into Lt knee ext (heel propped) 15-30 sec x 5 reps                      PT Long Term Goals - 09/10/19 1057      PT LONG TERM GOAL #1   Title The patient will be indep with HEP.    Status Partially Met      PT LONG TERM GOAL #2   Title The patient will report reduced functional limitation per FOTO from 70% to < or equal to 38%.    Status Achieved      PT LONG TERM GOAL #3   Title The patient will improve AROM Lt knee to 3-110 degrees.    Baseline 5-104 Left knee flexion    Status On-going      PT LONG TERM GOAL #4   Title The patient will improve gait speed to > or equal to 2.6 ft/sec for return to full community ambulation without a device.    Baseline 2.67 ft/sec    Status Achieved      PT LONG TERM GOAL #5   Title The patient will negotiate stairs in reciprocal pattern mod indep with one handrail.    Status Achieved                 Plan - 09/10/19 1058    Clinical Impression Statement Improved Lt knee flexion PROM; Lt knee ext gradually improving.  Pt has met LTG #5 and is making good gains towards remaining goals.    Rehab Potential Good    PT Frequency 3x / week    PT Duration 4 weeks    PT Treatment/Interventions ADLs/Self Care Home Management;Cryotherapy;Iontophoresis '4mg'$ /ml Dexamethasone;Moist Heat;Neuromuscular re-education;Therapeutic exercise;Therapeutic activities;Gait training;Stair training;Functional mobility training;Patient/family education;Taping;Vasopneumatic Device;Passive range of motion;Manual techniques;Dry needling  PT Next Visit Plan assess goals;  end of POC.    PT Home Exercise Plan Access Code: [email or web address]           Patient will benefit from skilled therapeutic intervention in order to improve the following deficits and impairments:  Abnormal gait, Increased fascial restricitons, Decreased range of motion, Decreased strength, Decreased activity tolerance, Pain, Hypomobility, Impaired flexibility, Increased edema  Visit Diagnosis: Acute pain of left knee  Muscle weakness (generalized)  Other abnormalities of gait and mobility  Localized edema     Problem List Patient Active Problem List   Diagnosis Date Noted  . Status post left knee surgery 06/11/2019  . Primary osteoarthritis of first carpometacarpal joint of left hand 01/26/2019  . Abnormal MRI of head 10/03/2015  . Primary osteoarthritis of left knee 10/26/2013  . Headache 09/30/2013  . Obstructive sleep apnea 7cm water pressure CPAP 10/24/2010  . Rosacea 09/13/2010  . ALLERGIC RHINITIS CAUSE UNSPECIFIED 08/03/2008  . SPONDYLOLISTHESIS, ACQUIRED 05/21/2006  . SCOLIOSIS 12/31/2005  . SHOULDER/ARM SPRAIN/STRAIN, UNSPEC. 12/31/2005   Kerin Perna, PTA 09/10/19 11:03 AM  [place], [place], [address] Phone: [phone]   Fax:  [phone]  Name: Briana Parks MRN: [id number] Date of Birth: [birth year]

## 2019-09-13 ENCOUNTER — Ambulatory Visit (INDEPENDENT_AMBULATORY_CARE_PROVIDER_SITE_OTHER): Payer: BC Managed Care – PPO | Admitting: Physical Therapy

## 2019-09-13 ENCOUNTER — Other Ambulatory Visit: Payer: Self-pay

## 2019-09-13 DIAGNOSIS — M25562 Pain in left knee: Secondary | ICD-10-CM

## 2019-09-13 DIAGNOSIS — M6281 Muscle weakness (generalized): Secondary | ICD-10-CM | POA: Diagnosis not present

## 2019-09-13 DIAGNOSIS — R600 Localized edema: Secondary | ICD-10-CM

## 2019-09-13 DIAGNOSIS — R2689 Other abnormalities of gait and mobility: Secondary | ICD-10-CM | POA: Diagnosis not present

## 2019-09-13 NOTE — Therapy (Signed)
[place], [place], [address] Phone: [phone]   Fax:  [phone]  Physical Therapy Treatment  Patient Details  Name: Briana Parks MRN: [id number] Date of Birth: [birth year] Referring Provider (PT): Dorna Leitz, MD   Encounter Date: 09/13/2019   PT End of Session - 09/13/19 2100    Visit Number 27    Number of Visits 30    Date for PT Re-Evaluation 09/14/19    PT Start Time 5638    PT Stop Time 1602   ice/MHP last 10 min   PT Time Calculation (min) 48 min    Activity Tolerance Patient tolerated treatment well    Behavior During Therapy [facility] for tasks assessed/performed           Past Medical History:  Diagnosis Date  . ALLERGIC RHINITIS CAUSE UNSPECIFIED 08/03/2008   Qualifier: Diagnosis of  By: Esmeralda Arthur    . Arachnoid cyst   . Deviated septum    large turbinates  . Gestational diabetes   . Headache 09/30/2013  . 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 Kidney infection   . Low blood pressure   . Obstructive sleep apnea 7cm water pressure CPAP 10/24/2010   Moderate. Sleep study was performed at [facility] neurological clinic by Dr. Pecolia Ades on 10/02/2010. AHI was 19.8 with desaturations to 79%. Frequent severe snoring. Moderate wedge works associated with respiratory events. Difficulty initiating and maintaining sleep with no slow wave sleep. She had severe OSA and REM.   . Osteoarthritis of left knee 10/26/2013  . Plantar fasciitis   . Pneumonia    history of  . PONV (postoperative nausea and vomiting)    hard to wake up after anesthesia, did not get PONV after 2017 surgery at [facility]. notes care everywhere  . Recurrent UTI   . Rosacea 09/13/2010  . Scoliosis   . Tinnitus   . Vertigo     Past Surgical History:  Procedure Laterality Date  . bone spur removal Right 02/17/2012   foot  . bone spur removal Left 08/2015  . HERNIA REPAIR Bilateral   . KNEE ARTHROSCOPY  Left 03/16/14   [facility]   . TONSILLECTOMY AND ADENOIDECTOMY    . TOTAL KNEE ARTHROPLASTY Left 06/11/2019   Procedure: TOTAL KNEE ARTHROPLASTY;  Surgeon: Dorna Leitz, MD;  Location: [facility];  Service: Orthopedics;  Laterality: Left;  . TUBAL LIGATION      There were no vitals filed for this visit.   Subjective Assessment - 09/13/19 1524    Subjective Pt reports her Lt knee bothered her all weekend; wasn't able to sleep well.  She reports it has been more sore and stiff.    Currently in Pain? No/denies    Pain Score 0-No pain    Pain Location Knee    Pain Orientation Left              [facility] PT Assessment - 09/13/19 0001      Assessment   Medical Diagnosis L total knee arthroplasty    Referring Provider (PT) Dorna Leitz, MD    Onset Date/Surgical Date 06/11/19    Hand Dominance Left    Next MD Visit 10/14/19    Prior Therapy known to our clinic from prior therapy      PROM   Right/Left Knee Left    Left Knee Extension -5    Left Knee Flexion 105   during assisted  heel slide           [facility] Adult PT Treatment/Exercise - 09/13/19 0001      Knee/Hip Exercises: Stretches   Passive Hamstring Stretch Left;2 reps;30 seconds    Hip Flexor Stretch Left;2 reps;30 seconds   Thomas position   Knee: Self-Stretch to increase Flexion Left;3 reps;30 seconds    Piriformis Stretch Left;2 reps;30 seconds    Gastroc Stretch Both;3 reps;20 seconds    Other Knee/Hip Stretches Standing Lt hip firehydrants and circumduction with UE on counter to increase hip ROM       Knee/Hip Exercises: Aerobic   Recumbent Bike L1: 5.5 min      Knee/Hip Exercises: Machines for Strengthening   Total Gym Leg Press 10 x 1 set - 7 plates    3 sec hold in ext (quad set)     Knee/Hip Exercises: Standing   Stairs 20 - 6" steps ascending/descnding with reciprocal pattern and no UE support     Other Standing Knee Exercises deep squat and standing to TKE (UE support on sink)  x 10 reps         Knee/Hip Exercises: Supine   Quad Sets Left;1 set;10 reps   long sitting, 10 sec hold     Moist Heat Therapy   Number Minutes Moist Heat 10 Minutes    Moist Heat Location Knee   Lt posterior     Cryotherapy   Number Minutes Cryotherapy 10 Minutes    Cryotherapy Location Knee   Lt    Type of Cryotherapy Ice pack      Manual Therapy   Joint Mobilization --    Soft tissue mobilization IASTM and STM to Lt hamstring and calf to decrease fascial restrictions and improve ROM    Passive ROM overpressure into Lt knee ext (heel propped), knee flexion with hip flexed                        PT Long Term Goals - 09/10/19 1057      PT LONG TERM GOAL #1   Title The patient will be indep with HEP.    Status Partially Met      PT LONG TERM GOAL #2   Title The patient will report reduced functional limitation per FOTO from 70% to < or equal to 38%.    Status Achieved      PT LONG TERM GOAL #3   Title The patient will improve AROM Lt knee to 3-110 degrees.    Baseline 5-104 Left knee flexion    Status On-going      PT LONG TERM GOAL #4   Title The patient will improve gait speed to > or equal to 2.6 ft/sec for return to full community ambulation without a device.    Baseline 2.67 ft/sec    Status Achieved      PT LONG TERM GOAL #5   Title The patient will negotiate stairs in reciprocal pattern mod indep with one handrail.    Status Achieved                 Plan - 09/13/19 1558    Clinical Impression Statement Pt complaining of more stiffness in her Lt knee today with exercise; gait more antalgic today. Her  Lt knee ROM gradually improving; progressing towards ROM LTG. Encouraged pt to continue diligent work on aggressive ROM exercises at home to assist with progression of ROM.    Rehab Potential Good  PT Frequency 3x / week    PT Duration 4 weeks    PT Treatment/Interventions ADLs/Self Care Home Management;Cryotherapy;Iontophoresis 11m/ml Dexamethasone;Moist  Heat;Neuromuscular re-education;Therapeutic exercise;Therapeutic activities;Gait training;Stair training;Functional mobility training;Patient/family education;Taping;Vasopneumatic Device;Passive range of motion;Manual techniques;Dry needling    PT Next Visit Plan assess need for additional visits vs hold.  end of POC.    PT Home Exercise Plan Access Code: [email or web address]          Patient will benefit from skilled therapeutic intervention in order to improve the following deficits and impairments:  Abnormal gait, Increased fascial restricitons, Decreased range of motion, Decreased strength, Decreased activity tolerance, Pain, Hypomobility, Impaired flexibility, Increased edema  Visit Diagnosis: Acute pain of left knee  Muscle weakness (generalized)  Other abnormalities of gait and mobility  Localized edema     Problem List Patient Active Problem List   Diagnosis Date Noted  . Status post left knee surgery 06/11/2019  . Primary osteoarthritis of first carpometacarpal joint of left hand 01/26/2019  . Abnormal MRI of head 10/03/2015  . Primary osteoarthritis of left knee 10/26/2013  . Headache 09/30/2013  . Obstructive sleep apnea 7cm water pressure CPAP 10/24/2010  . Rosacea 09/13/2010  . ALLERGIC RHINITIS CAUSE UNSPECIFIED 08/03/2008  . SPONDYLOLISTHESIS, ACQUIRED 05/21/2006  . SCOLIOSIS 12/31/2005  . SHOULDER/ARM SPRAIN/STRAIN, UNSPEC. 12/31/2005   JKerin Perna PTA 09/13/19 9:23 PM  [place] [address]Phone: [phone]  Fax:  [phone] Name: Briana MELOCHEMRN: [id number] of Birth: 209-12-1965

## 2019-09-14 ENCOUNTER — Other Ambulatory Visit: Payer: Self-pay | Admitting: Neurology

## 2019-09-17 ENCOUNTER — Encounter: Payer: Self-pay | Admitting: Rehabilitative and Restorative Service Providers"

## 2019-09-17 ENCOUNTER — Ambulatory Visit (INDEPENDENT_AMBULATORY_CARE_PROVIDER_SITE_OTHER): Payer: BC Managed Care – PPO | Admitting: Rehabilitative and Restorative Service Providers"

## 2019-09-17 ENCOUNTER — Other Ambulatory Visit: Payer: Self-pay

## 2019-09-17 DIAGNOSIS — R600 Localized edema: Secondary | ICD-10-CM | POA: Diagnosis not present

## 2019-09-17 DIAGNOSIS — R2689 Other abnormalities of gait and mobility: Secondary | ICD-10-CM | POA: Diagnosis not present

## 2019-09-17 DIAGNOSIS — M25562 Pain in left knee: Secondary | ICD-10-CM | POA: Diagnosis not present

## 2019-09-17 DIAGNOSIS — M6281 Muscle weakness (generalized): Secondary | ICD-10-CM

## 2019-09-17 NOTE — Therapy (Signed)
[facility] [place] [address] [place], [place], [address] Phone: [phone]   Fax:  [phone]  Physical Therapy Treatment  Patient Details  Name: Briana Parks MRN: [id number] Date of Birth: 1965-08-04 Referring Provider (PT): Jodi Geralds, MD   Encounter Date: 09/17/2019   PT End of Session - 09/17/19 0931    Visit Number 28    Number of Visits 36    Date for PT Re-Evaluation 10/17/19    PT Start Time 0845    PT Stop Time 0940    PT Time Calculation (min) 55 min    Activity Tolerance Patient tolerated treatment well    Behavior During Therapy [facility] for tasks assessed/performed           Past Medical History:  Diagnosis Date  . ALLERGIC RHINITIS CAUSE UNSPECIFIED 08/03/2008   Qualifier: Diagnosis of  By: Thomos Lemons    . Arachnoid cyst   . Deviated septum    large turbinates  . Gestational diabetes   . Headache 09/30/2013  . 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 Kidney infection   . Low blood pressure   . Obstructive sleep apnea 7cm water pressure CPAP 10/24/2010   Moderate. Sleep study was performed at [facility] neurological clinic by Dr. Marliss Coots on 10/02/2010. AHI was 19.8 with desaturations to 79%. Frequent severe snoring. Moderate wedge works associated with respiratory events. Difficulty initiating and maintaining sleep with no slow wave sleep. She had severe OSA and REM.   . Osteoarthritis of left knee 10/26/2013  . Plantar fasciitis   . Pneumonia    history of  . PONV (postoperative nausea and vomiting)    hard to wake up after anesthesia, did not get PONV after 2017 surgery at [facility]. notes care everywhere  . Recurrent UTI   . Rosacea 09/13/2010  . Scoliosis   . Tinnitus   . Vertigo     Past Surgical History:  Procedure Laterality Date  . bone spur removal Right 02/17/2012   foot  . bone spur removal Left 08/2015  . HERNIA REPAIR Bilateral   . KNEE ARTHROSCOPY Left 03/16/14   [facility]   . TONSILLECTOMY AND ADENOIDECTOMY    . TOTAL KNEE ARTHROPLASTY Left 06/11/2019   Procedure: TOTAL KNEE ARTHROPLASTY;  Surgeon: Jodi Geralds, MD;  Location: [facility];  Service: Orthopedics;  Laterality: Left;  . TUBAL LIGATION      There were no vitals filed for this visit.   Subjective Assessment - 09/17/19 0848    Subjective The patient reports her L knee is keeping her up at night and she thinks her scar/incision is tight.    Patient Stated Goals normalize gait    Currently in Pain? Yes    Pain Score 1     Pain Location Knee    Pain Descriptors / Indicators Discomfort;Nagging    Pain Type Chronic pain    Pain Onset More than a month ago    Pain Frequency Intermittent    Aggravating Factors  bending knee, stairs, stretching    Pain Relieving Factors rest, ice, massage              [facility] PT Assessment - 09/17/19 0851      Assessment   Medical Diagnosis L total knee arthroplasty    Referring Provider (PT) Jodi Geralds, MD    Onset Date/Surgical Date 06/11/19    Hand Dominance Left    Next MD Visit  10/14/19      AROM   Overall AROM  Deficits    Left Knee Extension --   rests at -8 in supine, can contract to -6 with quad set     PROM   Right/Left Knee Left    Left Knee Extension -6    Left Knee Flexion 108   supine knee to chest with passive overpressure                        [facility] Adult PT Treatment/Exercise - 09/17/19 0851      Ambulation/Gait   Ambulation/Gait Yes    Ambulation/Gait Assistance 6: Modified independent (Device/Increase time)    Ambulation Distance (Feet) 300 Feet    Assistive device None    Ambulation Surface Level;Indoor      Exercises   Exercises Knee/Hip      Knee/Hip Exercises: Stretches   Hip Flexor Stretch Right;Left;2 reps;30 seconds    Knee: Self-Stretch to increase Flexion Left;2 reps;30 seconds    Knee: Self-Stretch Limitations attempted supine self mobilization for knee flexion with towel  behind knee       Knee/Hip Exercises: Aerobic   Recumbent Bike L3:  4 minutes      Knee/Hip Exercises: Standing   Lateral Step Up Left;10 reps    Step Down Left;10 reps    Other Standing Knee Exercises Used L LE on treadmill belt to move belt (treadmill off) from anterior to posterior eliciting hip contraction      Knee/Hip Exercises: Supine   Quad Sets Left;5 reps    Heel Slides AAROM;Left;10 reps    Terminal Knee Extension Strengthening;Left;10 reps    Patellar Mobs patellar mobs in lateral and inferior to superior direction to patient tolerance  *Instructed patient in mobs for home      Knee/Hip Exercises: Prone   Hamstring Curl 10 reps      Vasopneumatic   Number Minutes Vasopneumatic  10 minutes    Vasopnuematic Location  Knee    Vasopneumatic Pressure Medium    Vasopneumatic Temperature  34 deg      Manual Therapy   Manual Therapy Joint mobilization;Soft tissue mobilization;Passive ROM;Muscle Energy Technique    Manual therapy comments to improve end range mobilization    Joint Mobilization grade II joint mobs for flexion and extension     Passive ROM overpressure into knee flexion (prone) adding contract/relax at end range,/ overpressure into knee extension with 10 second holds x 5 reps    Muscle Energy Technique contract/relax and end range contraction for muscle re-education                  PT Education - 09/17/19 0931    Education Details HEP    Person(s) Educated Patient    Methods Explanation;Demonstration;Handout    Comprehension Verbalized understanding;Returned demonstration               PT Long Term Goals - 09/17/19 0850      PT LONG TERM GOAL #1   Title The patient will be indep with HEP.    Status Partially Met      PT LONG TERM GOAL #2   Title The patient will report reduced functional limitation per FOTO from 70% to < or equal to 38%.    Baseline 37% limited (07/28/19)    Status Achieved      PT LONG TERM GOAL #3   Title The  patient will improve AROM Lt knee to 3-110 degrees.  Baseline 5-104 Left knee flexion    Status On-going      PT LONG TERM GOAL #4   Title The patient will improve gait speed to > or equal to 2.6 ft/sec for return to full community ambulation without a device.    Baseline 2.67 ft/sec    Status Achieved      PT LONG TERM GOAL #5   Title The patient will negotiate stairs in reciprocal pattern mod indep with one handrail.    Status Achieved           UPDATED LONG TERM GOALS:       Plan - 09/17/19 1317    Clinical Impression Statement The patient c/o pain at night in inferior/lateral patella.  She has pain with direct palpation and patellar mobilization.  PT worked on end range motion, joint mobility, patellar mobility, and gait mechanics.  Discussed focusing on HEP to reduce stiffness and continuing to push ROM for normalizing gait.    Rehab Potential Good    PT Frequency 2x / week    PT Duration 4 weeks    PT Treatment/Interventions ADLs/Self Care Home Management;Cryotherapy;Iontophoresis '4mg'$ /ml Dexamethasone;Moist Heat;Neuromuscular re-education;Therapeutic exercise;Therapeutic activities;Gait training;Stair training;Functional mobility training;Patient/family education;Taping;Vasopneumatic Device;Passive range of motion;Manual techniques;Dry needling    PT Next Visit Plan Renew 2x/week x 4 weeks.  Emphasizing:  end range mobility, joint mobilization, home program for self mobilization, normalizing gait    PT Home Exercise Plan Access Code: [email or web address]    Consulted and Agree with Plan of Care Patient           Patient will benefit from skilled therapeutic intervention in order to improve the following deficits and impairments:  Abnormal gait, Increased fascial restricitons, Decreased range of motion, Decreased strength, Decreased activity tolerance, Pain, Hypomobility, Impaired flexibility, Increased edema  Visit Diagnosis: Acute pain of left knee  Muscle weakness  (generalized)  Other abnormalities of gait and mobility  Localized edema     Problem List Patient Active Problem List   Diagnosis Date Noted  . Status post left knee surgery 06/11/2019  . Primary osteoarthritis of first carpometacarpal joint of left hand 01/26/2019  . Abnormal MRI of head 10/03/2015  . Primary osteoarthritis of left knee 10/26/2013  . Headache 09/30/2013  . Obstructive sleep apnea 7cm water pressure CPAP 10/24/2010  . Rosacea 09/13/2010  . ALLERGIC RHINITIS CAUSE UNSPECIFIED 08/03/2008  . SPONDYLOLISTHESIS, ACQUIRED 05/21/2006  . SCOLIOSIS 12/31/2005  . SHOULDER/ARM SPRAIN/STRAIN, UNSPEC. 12/31/2005    [place], PT 09/17/2019, [place], [place], [address] Phone: [phone]   Fax:  [phone]  Name: Briana Parks MRN: [id number] Date of Birth: 1966/01/29

## 2019-09-17 NOTE — Patient Instructions (Signed)
Access Code: [email or web address] URL: [email or web address] Date: 09/17/2019 Prepared by: Margretta Ditty  Exercises Supine Short Arc Quad - 2 x daily - 7 x weekly - 10 reps - 1 sets Long Sitting Quad Set with Towel Roll Under Heel - 2 x daily - 7 x weekly - 1 sets - 10 reps Sidelying Hip Abduction - 2 x daily - 7 x weekly - 10 reps - 1 sets Gastroc Stretch on Wall - 2 x daily - 7 x weekly - 1 sets - 2 reps - 30 seconds hold Prone Knee Extension Hang - 2 x daily - 7 x weekly - 1 sets - 2-3 reps - 15-30 hold Seated Knee Flexion Stretch - 2 x daily - 7 x weekly - 1 sets - 10 reps - 10 hold Sit to Stand - 2 x daily - 7 x weekly - 1 sets - 10 reps Prone Quadriceps Stretch with Strap - 2 x daily - 7 x weekly - 1 sets - 3 reps - 10 seconds hold Long Sitting Superior Patellar Glide - 2 x daily - 7 x weekly - 1 sets - 10 reps Long Sitting Lateral Patellar Glide - 2 x daily - 7 x weekly - 1 sets - 10 reps

## 2019-09-19 ENCOUNTER — Other Ambulatory Visit: Payer: Self-pay | Admitting: Sports Medicine

## 2019-09-19 DIAGNOSIS — M1712 Unilateral primary osteoarthritis, left knee: Secondary | ICD-10-CM

## 2019-09-28 ENCOUNTER — Ambulatory Visit (INDEPENDENT_AMBULATORY_CARE_PROVIDER_SITE_OTHER): Payer: BC Managed Care – PPO | Admitting: Rehabilitative and Restorative Service Providers"

## 2019-09-28 ENCOUNTER — Other Ambulatory Visit: Payer: Self-pay

## 2019-09-28 ENCOUNTER — Encounter: Payer: Self-pay | Admitting: Rehabilitative and Restorative Service Providers"

## 2019-09-28 DIAGNOSIS — R2689 Other abnormalities of gait and mobility: Secondary | ICD-10-CM | POA: Diagnosis not present

## 2019-09-28 DIAGNOSIS — R600 Localized edema: Secondary | ICD-10-CM

## 2019-09-28 DIAGNOSIS — M25562 Pain in left knee: Secondary | ICD-10-CM | POA: Diagnosis not present

## 2019-09-28 DIAGNOSIS — M6281 Muscle weakness (generalized): Secondary | ICD-10-CM | POA: Diagnosis not present

## 2019-09-28 NOTE — Therapy (Signed)
week    PT Duration 4  weeks    PT Treatment/Interventions ADLs/Self Care Home Management;Cryotherapy;Iontophoresis 4mg /ml Dexamethasone;Moist Heat;Neuromuscular re-education;Therapeutic exercise;Therapeutic activities;Gait training;Stair training;Functional mobility training;Patient/family education;Taping;Vasopneumatic Device;Passive range of motion;Manual techniques;Dry needling    PT Next Visit Plan Renew 2x/week x 4 weeks.  Emphasizing:  end range mobility, joint mobilization, home program for self mobilization, normalizing gait    PT Home Exercise Plan Access Code: [email or web address]    Consulted and Agree with Plan of Care Patient           Patient will benefit from skilled therapeutic intervention in order to improve the following deficits and impairments:  Abnormal gait, Increased fascial restricitons, Decreased range of motion, Decreased strength, Decreased activity tolerance, Pain, Hypomobility, Impaired flexibility, Increased edema  Visit Diagnosis: Acute pain of left knee  Muscle weakness (generalized)  Other abnormalities of gait and mobility  Localized edema     Problem List Patient Active Problem List   Diagnosis Date Noted  . Status post left knee surgery 06/11/2019  . Primary osteoarthritis of first carpometacarpal joint of left hand 01/26/2019  . Abnormal MRI of head 10/03/2015  . Primary osteoarthritis of left knee 10/26/2013  . Headache 09/30/2013  . Obstructive sleep apnea 7cm water pressure CPAP 10/24/2010  . Rosacea 09/13/2010  . ALLERGIC RHINITIS CAUSE UNSPECIFIED 08/03/2008  . SPONDYLOLISTHESIS, ACQUIRED 05/21/2006  . SCOLIOSIS 12/31/2005  . SHOULDER/ARM SPRAIN/STRAIN, UNSPEC. 12/31/2005    Nakai Yard, PT 09/28/2019, 12:52 PM  [facility] [address] [place], [place], [place] Phone: [phone]   Fax:  [phone]  Name: Briana Parks MRN: Annamarie Major Date of Birth: [birth year]  [facility] [address] [place], [place], [address] Phone: [phone]   Fax:  [phone]  Physical Therapy Treatment  Patient Details  Name: Briana Parks MRN: [id number] Date of Birth: [birth year] Referring Provider (PT): Jodi Geralds, MD   Encounter Date: 09/28/2019   PT End of Session - 09/28/19 0801    Visit Number 29    Number of Visits 36    Date for PT Re-Evaluation 10/17/19    PT Start Time 0800    PT Stop Time 0844    PT Time Calculation (min) 44 min    Activity Tolerance Patient tolerated treatment well    Behavior During Therapy [facility] for tasks assessed/performed           Past Medical History:  Diagnosis Date  . ALLERGIC RHINITIS CAUSE UNSPECIFIED 08/03/2008   Qualifier: Diagnosis of  By: Thomos Lemons    . Arachnoid cyst   . Deviated septum    large turbinates  . Gestational diabetes   . Headache 09/30/2013  . 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 Kidney infection   . Low blood pressure   . Obstructive sleep apnea 7cm water pressure CPAP 10/24/2010   Moderate. Sleep study was performed at [facility] neurological clinic by Dr. Marliss Coots on 10/02/2010. AHI was 19.8 with desaturations to 79%. Frequent severe snoring. Moderate wedge works associated with respiratory events. Difficulty initiating and maintaining sleep with no slow wave sleep. She had severe OSA and REM.   . Osteoarthritis of left knee 10/26/2013  . Plantar fasciitis   . Pneumonia    history of  . PONV (postoperative nausea and vomiting)    hard to wake up after anesthesia, did not get PONV after 2017 surgery at [facility]. notes care everywhere  . Recurrent UTI   . Rosacea 09/13/2010  . Scoliosis   . Tinnitus   . Vertigo     Past Surgical History:  Procedure Laterality Date  . bone spur removal Right 02/17/2012   foot  . bone spur removal Left 08/2015  . HERNIA REPAIR Bilateral   . KNEE ARTHROSCOPY Left 03/16/14   [facility]   . TONSILLECTOMY AND ADENOIDECTOMY    . TOTAL KNEE ARTHROPLASTY Left 06/11/2019   Procedure: TOTAL KNEE ARTHROPLASTY;  Surgeon: Jodi Geralds, MD;  Location: [facility];  Service: Orthopedics;  Laterality: Left;  . TUBAL LIGATION      There were no vitals filed for this visit.   Subjective Assessment - 09/28/19 0754    Subjective The patient reports she did well on vacation with her knee.  She had taller stairs and that was a challenge.    Patient Stated Goals normalize gait    Currently in Pain? No/denies              [facility] PT Assessment - 09/28/19 0802      AROM   Overall AROM  Deficits      PROM   Right/Left Knee Left    Left Knee Extension -5    Left Knee Flexion 106                         [facility] Adult PT Treatment/Exercise - 09/28/19 0802      Ambulation/Gait   Ambulation/Gait Yes    Ambulation/Gait Assistance 7: Independent    Ambulation Distance (Feet) 500 Feet    Assistive device None    Stairs --  week    PT Duration 4  weeks    PT Treatment/Interventions ADLs/Self Care Home Management;Cryotherapy;Iontophoresis 4mg /ml Dexamethasone;Moist Heat;Neuromuscular re-education;Therapeutic exercise;Therapeutic activities;Gait training;Stair training;Functional mobility training;Patient/family education;Taping;Vasopneumatic Device;Passive range of motion;Manual techniques;Dry needling    PT Next Visit Plan Renew 2x/week x 4 weeks.  Emphasizing:  end range mobility, joint mobilization, home program for self mobilization, normalizing gait    PT Home Exercise Plan Access Code: [email or web address]    Consulted and Agree with Plan of Care Patient           Patient will benefit from skilled therapeutic intervention in order to improve the following deficits and impairments:  Abnormal gait, Increased fascial restricitons, Decreased range of motion, Decreased strength, Decreased activity tolerance, Pain, Hypomobility, Impaired flexibility, Increased edema  Visit Diagnosis: Acute pain of left knee  Muscle weakness (generalized)  Other abnormalities of gait and mobility  Localized edema     Problem List Patient Active Problem List   Diagnosis Date Noted  . Status post left knee surgery 06/11/2019  . Primary osteoarthritis of first carpometacarpal joint of left hand 01/26/2019  . Abnormal MRI of head 10/03/2015  . Primary osteoarthritis of left knee 10/26/2013  . Headache 09/30/2013  . Obstructive sleep apnea 7cm water pressure CPAP 10/24/2010  . Rosacea 09/13/2010  . ALLERGIC RHINITIS CAUSE UNSPECIFIED 08/03/2008  . SPONDYLOLISTHESIS, ACQUIRED 05/21/2006  . SCOLIOSIS 12/31/2005  . SHOULDER/ARM SPRAIN/STRAIN, UNSPEC. 12/31/2005    Nakai Yard, PT 09/28/2019, 12:52 PM  [facility] [address] [place], [place], [place] Phone: [phone]   Fax:  [phone]  Name: Briana Parks MRN: Annamarie Major Date of Birth: [birth year]

## 2019-09-28 NOTE — Patient Instructions (Signed)
Access Code: [email or web address] URL: [email or web address] Date: 09/28/2019 Prepared by: Margretta Ditty  Exercises Supine Short Arc Quad - 1 x daily - 7 x weekly - 10 reps - 1 sets Long Sitting Quad Set with Towel Roll Under Heel - 1 x daily - 7 x weekly - 1 sets - 10 reps Sidelying Hip Abduction - 1 x daily - 7 x weekly - 10 reps - 1 sets Gastroc Stretch on Wall - 2 x daily - 7 x weekly - 1 sets - 2 reps - 30 seconds hold Prone Knee Extension Hang - 1 x daily - 7 x weekly - 1 sets - 2-3 reps - 15-30 hold Seated Knee Flexion Stretch - 1 x daily - 7 x weekly - 1 sets - 10 reps - 10 hold Sit to Stand - 1 x daily - 7 x weekly - 1 sets - 10 reps Prone Quadriceps Stretch with Strap - 1 x daily - 7 x weekly - 1 sets - 3 reps - 10 seconds hold Long Sitting Superior Patellar Glide - 1 x daily - 7 x weekly - 1 sets - 10 reps Long Sitting Lateral Patellar Glide - 1 x daily - 7 x weekly - 1 sets - 10 reps Quadruped Knee Flexion Stretch - 1 x daily - 7 x weekly - 1 sets - 3 reps - 30 seconds hold

## 2019-09-30 ENCOUNTER — Ambulatory Visit (INDEPENDENT_AMBULATORY_CARE_PROVIDER_SITE_OTHER): Payer: BC Managed Care – PPO | Admitting: Physical Therapy

## 2019-09-30 ENCOUNTER — Other Ambulatory Visit: Payer: Self-pay

## 2019-09-30 DIAGNOSIS — R600 Localized edema: Secondary | ICD-10-CM

## 2019-09-30 DIAGNOSIS — M25562 Pain in left knee: Secondary | ICD-10-CM | POA: Diagnosis not present

## 2019-09-30 DIAGNOSIS — R2689 Other abnormalities of gait and mobility: Secondary | ICD-10-CM | POA: Diagnosis not present

## 2019-09-30 DIAGNOSIS — M6281 Muscle weakness (generalized): Secondary | ICD-10-CM | POA: Diagnosis not present

## 2019-09-30 NOTE — Therapy (Signed)
[facility] [place] [address] [place], [place], [address] Phone: [phone]   Fax:  [phone]  Physical Therapy Treatment  Patient Details  Name: Briana Parks MRN: [id number] Date of Birth: 10-11-1965 Referring Provider (PT): Jodi Geralds, MD   Encounter Date: 09/30/2019   PT End of Session - 09/30/19 0848    Visit Number 30    Number of Visits 36    Date for PT Re-Evaluation 10/17/19    PT Start Time 0843    PT Stop Time 0928    PT Time Calculation (min) 45 min    Activity Tolerance Patient tolerated treatment well    Behavior During Therapy [facility] for tasks assessed/performed           Past Medical History:  Diagnosis Date  . ALLERGIC RHINITIS CAUSE UNSPECIFIED 08/03/2008   Qualifier: Diagnosis of  By: Thomos Lemons    . Arachnoid cyst   . Deviated septum    large turbinates  . Gestational diabetes   . Headache 09/30/2013  . 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 Kidney infection   . Low blood pressure   . Obstructive sleep apnea 7cm water pressure CPAP 10/24/2010   Moderate. Sleep study was performed at [facility] neurological clinic by Dr. Marliss Coots on 10/02/2010. AHI was 19.8 with desaturations to 79%. Frequent severe snoring. Moderate wedge works associated with respiratory events. Difficulty initiating and maintaining sleep with no slow wave sleep. She had severe OSA and REM.   . Osteoarthritis of left knee 10/26/2013  . Plantar fasciitis   . Pneumonia    history of  . PONV (postoperative nausea and vomiting)    hard to wake up after anesthesia, did not get PONV after 2017 surgery at [facility]. notes care everywhere  . Recurrent UTI   . Rosacea 09/13/2010  . Scoliosis   . Tinnitus   . Vertigo     Past Surgical History:  Procedure Laterality Date  . bone spur removal Right 02/17/2012   foot  . bone spur removal Left 08/2015  . HERNIA REPAIR Bilateral   . KNEE ARTHROSCOPY Left 03/16/14   [facility]   . TONSILLECTOMY AND ADENOIDECTOMY    . TOTAL KNEE ARTHROPLASTY Left 06/11/2019   Procedure: TOTAL KNEE ARTHROPLASTY;  Surgeon: Jodi Geralds, MD;  Location: [facility];  Service: Orthopedics;  Laterality: Left;  . TUBAL LIGATION      There were no vitals filed for this visit.   Subjective Assessment - 09/30/19 0850    Subjective Pt reports her Lt knee is no longer hurting.  Otherwise no new changes.    Patient Stated Goals normalize gait    Currently in Pain? No/denies    Pain Score 0-No pain              [facility] PT Assessment - 09/30/19 0001      Assessment   Medical Diagnosis L total knee arthroplasty    Referring Provider (PT) Jodi Geralds, MD    Onset Date/Surgical Date 06/11/19    Hand Dominance Left    Next MD Visit 10/14/19    Prior Therapy known to our clinic from prior therapy      PROM   Left Knee Extension -4    Left Knee Flexion 107           [facility] Adult PT Treatment/Exercise - 09/30/19 0001      Knee/Hip Exercises: Stretches   Passive Hamstring  Stretch Left;60 seconds;2 reps   added verbally to HEP.    Passive Hamstring Stretch Limitations seated with foot on chair, ice pack on knee for overpressure     Hip Flexor Stretch Left;1 rep;30 seconds    Gastroc Stretch Both;2 reps;30 seconds    Other Knee/Hip Stretches standing Lt adductor stretch in side lunge, 20 sec hold x 3 reps with UE support.     Other Knee/Hip Stretches quadruped and tall kneeling position, moving buttocks towards heels x 30 sec x 3 reps, single tall kneel to increase Lt knee flexion x 30 sec x 4 reps      Knee/Hip Exercises: Aerobic   Recumbent Bike L1-4: 5 min     Other Aerobic single laps around gym (with SPC- 80 ft) in between exercises to decrease stiffness.       Knee/Hip Exercises: Standing   Other Standing Knee Exercises split squats x 5 reps each leg       Manual Therapy   Manual therapy comments to improve end range mobility    Joint Mobilization grade  II joint mobilization and static progressive stretch at end range    Passive ROM overpressure to Lt knee at end range    Muscle Energy Technique contract/relax at end range Lt knee ext              PT Long Term Goals - 09/30/19 1018      PT LONG TERM GOAL #1   Title The patient will be indep with HEP.    Time 4    Period Weeks    Status Revised      PT LONG TERM GOAL #2   Title The patient will demonstrate equal stance time per observation with gait activities.    Time 4    Period Weeks    Status Revised      PT LONG TERM GOAL #3   Title The patient will improve AROM Lt knee to 3-110 degrees.    Baseline 4-107 (passive) Left knee flexion    Status Revised                 Plan - 09/30/19 1014    Clinical Impression Statement Session focused on sustained stretches into end ranges of Lt knee, in various positions.  Encouraged pt to be more diligent with frequent work on ROM at home. Will continue to progress end range motion and improve functional strength.    Rehab Potential Good    PT Frequency 2x / week    PT Duration 4 weeks    PT Treatment/Interventions ADLs/Self Care Home Management;Cryotherapy;Iontophoresis 4mg /ml Dexamethasone;Moist Heat;Neuromuscular re-education;Therapeutic exercise;Therapeutic activities;Gait training;Stair training;Functional mobility training;Patient/family education;Taping;Vasopneumatic Device;Passive range of motion;Manual techniques;Dry needling    PT Next Visit Plan continue emphasizing:  end range mobility, joint mobilization, home program for self mobilization, normalizing gait    PT Home Exercise Plan Access Code: [email or web address]    Consulted and Agree with Plan of Care Patient           Patient will benefit from skilled therapeutic intervention in order to improve the following deficits and impairments:  Abnormal gait, Increased fascial restricitons, Decreased range of motion, Decreased strength, Decreased activity tolerance, Pain,  Hypomobility, Impaired flexibility, Increased edema  Visit Diagnosis: Acute pain of left knee  Muscle weakness (generalized)  Other abnormalities of gait and mobility  Localized edema     Problem List Patient Active Problem List   Diagnosis Date Noted  . Status post left knee surgery  06/11/2019  . Primary osteoarthritis of first carpometacarpal joint of left hand 01/26/2019  . Abnormal MRI of head 10/03/2015  . Primary osteoarthritis of left knee 10/26/2013  . Headache 09/30/2013  . Obstructive sleep apnea 7cm water pressure CPAP 10/24/2010  . Rosacea 09/13/2010  . ALLERGIC RHINITIS CAUSE UNSPECIFIED 08/03/2008  . SPONDYLOLISTHESIS, ACQUIRED 05/21/2006  . SCOLIOSIS 12/31/2005  . SHOULDER/ARM SPRAIN/STRAIN, UNSPEC. 12/31/2005   Mayer Camel, PTA 09/30/19 10:19 AM  [facility] [address] [place], [place], [address] Phone: [phone]   Fax:  [phone]  Name: HEBAH BOGOSIAN MRN: [id number] Date of Birth: 02/24/1966

## 2019-10-04 ENCOUNTER — Other Ambulatory Visit: Payer: Self-pay

## 2019-10-04 ENCOUNTER — Other Ambulatory Visit (HOSPITAL_COMMUNITY)
Admission: RE | Admit: 2019-10-04 | Discharge: 2019-10-04 | Disposition: A | Discharge status: Home / Self Care | Payer: BC Managed Care – PPO | Source: Ambulatory Visit | Attending: Family Medicine | Admitting: Family Medicine

## 2019-10-04 ENCOUNTER — Encounter: Payer: Self-pay | Admitting: Family Medicine

## 2019-10-04 ENCOUNTER — Ambulatory Visit (INDEPENDENT_AMBULATORY_CARE_PROVIDER_SITE_OTHER): Payer: BC Managed Care – PPO | Admitting: Family Medicine

## 2019-10-04 VITALS — BP 123/61 | HR 71 | Ht 66.0 in | Wt 217.0 lb

## 2019-10-04 DIAGNOSIS — Z0000 Encounter for general adult medical examination without abnormal findings: Secondary | ICD-10-CM | POA: Diagnosis present

## 2019-10-04 DIAGNOSIS — Z23 Encounter for immunization: Secondary | ICD-10-CM | POA: Diagnosis not present

## 2019-10-04 DIAGNOSIS — Z124 Encounter for screening for malignant neoplasm of cervix: Secondary | ICD-10-CM

## 2019-10-04 DIAGNOSIS — Z6835 Body mass index (BMI) 35.0-35.9, adult: Secondary | ICD-10-CM | POA: Diagnosis not present

## 2019-10-04 DIAGNOSIS — Z1211 Encounter for screening for malignant neoplasm of colon: Secondary | ICD-10-CM | POA: Diagnosis not present

## 2019-10-04 NOTE — Addendum Note (Signed)
Addended by: Deno Etienne on: 10/04/2019 05:23 PM   Modules accepted: Orders

## 2019-10-04 NOTE — Progress Notes (Signed)
CPE   Established Patient Office Visit  Subjective:  Patient ID: Briana Parks, female    DOB: [birth year]  Age: 54 y.o. MRN: [id number]  CC:  Chief Complaint  Patient presents with  . Annual Exam    HPI Briana Parks presents for CPE.  She is doing well.  She is really trying to cut back on portion sizes and has lost a couple pounds.  She is not actively exercising but is interested in losing more weight.  She had a local reaction with swelling redness and swelling of the lymph nodes in the axilla on the arm that she was given the Tdap injection about 20 years ago.  She has not had one since then because of fear of having another reaction.  Past Medical History:  Diagnosis Date  . ALLERGIC RHINITIS CAUSE UNSPECIFIED 08/03/2008   Qualifier: Diagnosis of  By: Thomos Lemons    . Arachnoid cyst   . Deviated septum    large turbinates  . Gestational diabetes   . Headache 09/30/2013  . 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 Kidney infection   . Low blood pressure   . Obstructive sleep apnea 7cm water pressure CPAP 10/24/2010   Moderate. Sleep study was performed at [facility] neurological clinic by Dr. Marliss Coots on 10/02/2010. AHI was 19.8 with desaturations to 79%. Frequent severe snoring. Moderate wedge works associated with respiratory events. Difficulty initiating and maintaining sleep with no slow wave sleep. She had severe OSA and REM.   . Osteoarthritis of left knee 10/26/2013  . Plantar fasciitis   . Pneumonia    history of  . PONV (postoperative nausea and vomiting)    hard to wake up after anesthesia, did not get PONV after 2017 surgery at [facility]. notes care everywhere  . Recurrent UTI   . Rosacea 09/13/2010  . Scoliosis   . Tinnitus   . Vertigo     Past Surgical History:  Procedure Laterality Date  . bone spur removal Right 02/17/2012   foot  . bone spur removal Left 08/2015  . HERNIA REPAIR Bilateral   . KNEE ARTHROSCOPY Left 03/16/14   [facility]   . TONSILLECTOMY AND ADENOIDECTOMY    . TOTAL KNEE ARTHROPLASTY Left 06/11/2019   Procedure: TOTAL KNEE ARTHROPLASTY;  Surgeon: Jodi Geralds, MD;  Location: [facility];  Service: Orthopedics;  Laterality: Left;  . TUBAL LIGATION      Family History  Problem Relation Age of Onset  . Arthritis Mother   . Fibromyalgia Mother   . Cancer Mother        liver small intestine carcinoid tumor    Social History   Socioeconomic History  . Marital status: Married    Spouse name: Fayrene Fearing  . Number of children: 2  . Years of education: college  . Highest education level: Not on file  Occupational History    Employer: [employer]    Comment: [employer]  Tobacco Use  . Smoking status: Never Smoker  . Smokeless tobacco: Never Used  Vaping Use  . Vaping Use: Never used  Substance and Sexual Activity  . Alcohol use: Yes    Comment: mixed drinks  . Drug use: No  . Sexual activity: Not on file    Comment: cake decorator, 2 yrs college, married, 2 children, 4 sodas daily, no regular exercise  Other Topics Concern  . Not on file  Social History Narrative   Patient lives  at home with her husband Fayrene Fearing(James)   Education two years of college education.   Patient works for [employer]. [employer]   Right handed.   Caffeine three cups of coke daily.         Social Determinants of Health   Financial Resource Strain:   . Difficulty of Paying Living Expenses:   Food Insecurity:   . Worried About Programme researcher, broadcasting/film/videounning Out of Food in the Last Year:   . Baristaan Out of Food in the Last Year:   Transportation Needs:   . Freight forwarderLack of Transportation (Medical):   Marland Kitchen. Lack of Transportation (Non-Medical):   Physical Activity:   . Days of Exercise per Week:   . Minutes of Exercise per Session:   Stress:   . Feeling of Stress :   Social Connections:   . Frequency of Communication with Friends and Family:   . Frequency of Social Gatherings with Friends and Family:   . Attends Religious Services:    . Active Member of Clubs or Organizations:   . Attends BankerClub or Organization Meetings:   Marland Kitchen. Marital Status:   Intimate Partner Violence:   . Fear of Current or Ex-Partner:   . Emotionally Abused:   Marland Kitchen. Physically Abused:   . Sexually Abused:     Outpatient Medications Prior to Visit  Medication Sig Dispense Refill  . AMBULATORY NON FORMULARY MEDICATION Medication Name: 7 cm water pressure CPAP, heated/humidified machine.   Dx : Severe OSA. 1 Units 0  . loratadine (CLARITIN) 10 MG tablet Take 10 mg by mouth at bedtime.    . meloxicam (MOBIC) 15 MG tablet Take 1 tablet (15 mg total) by mouth daily as needed for pain. 30 tablet 3  . Multiple Vitamin (MULTIVITAMIN WITH MINERALS) TABS tablet Take 1 tablet by mouth daily.    Marland Kitchen. NORTREL 7/7/7 0.5/0.75/1-35 MG-MCG tablet TAKE 1 TABLET EVERY DAY 84 tablet 3  . nystatin (MYCOSTATIN/NYSTOP) powder Apply topically 4 (four) times daily. (Patient taking differently: Apply 1 application topically 4 (four) times daily as needed (skin irritation/rash). ) 60 g 1  . nystatin cream (MYCOSTATIN) Apply 1 application topically 2 (two) times daily. (Patient taking differently: Apply 1 application topically 2 (two) times daily as needed (skin irritation/rash). ) 45 g 1  . propranolol (INDERAL) 20 MG tablet TAKE 1 TABLET BY MOUTH TWICE A DAY 180 tablet 3  . aspirin EC 325 MG tablet Take 1 tablet (325 mg total) by mouth 2 (two) times daily after a meal. Take x 1 month post op to decrease risk of blood clots. 60 tablet 0  . diphenhydrAMINE-Acetaminophen (NIGHTTIME PAIN PLUS SLEEP PO) Take 15 mLs by mouth at bedtime as needed (pain/sleep.). ZzzQuil Night Pain    . docusate sodium (COLACE) 100 MG capsule Take 1 capsule (100 mg total) by mouth 2 (two) times daily. 30 capsule 0  . oxyCODONE-acetaminophen (PERCOCET/ROXICET) 5-325 MG tablet Take 1-2 tablets by mouth every 6 (six) hours as needed for severe pain. 40 tablet 0  . tiZANidine (ZANAFLEX) 4 MG tablet Take 1 tablet (4  mg total) by mouth every 8 (eight) hours as needed for muscle spasms. 40 tablet 0   No facility-administered medications prior to visit.    Allergies  Allergen Reactions  . Eggs Or Egg-Derived Products   . Sulfamethoxazole-Trimethoprim Hives  . Tdap [Tetanus-Diphth-Acell Pertussis] Other (See Comments)    Skin reaction with lymph node sweeling    ROS Review of Systems    Objective:    Physical Exam  Constitutional:      Appearance: She is well-developed.  HENT:     Head: Normocephalic and atraumatic.     Right Ear: External ear normal.     Left Ear: External ear normal.     Nose: Nose normal.  Eyes:     Conjunctiva/sclera: Conjunctivae normal.     Pupils: Pupils are equal, round, and reactive to light.  Neck:     Thyroid: No thyromegaly.  Cardiovascular:     Rate and Rhythm: Normal rate and regular rhythm.     Heart sounds: Normal heart sounds.  Pulmonary:     Effort: Pulmonary effort is normal.     Breath sounds: Normal breath sounds. No wheezing.  Abdominal:     General: Abdomen is flat. Bowel sounds are normal.     Palpations: Abdomen is soft. There is no mass.  Genitourinary:    General: Normal vulva.     Labia:        Right: No rash.        Left: No rash.      Vagina: Normal.     Cervix: Normal.     Uterus: Normal.      Adnexa: Right adnexa normal and left adnexa normal.     Rectum: Normal.  Musculoskeletal:     Cervical back: Neck supple.  Lymphadenopathy:     Cervical: No cervical adenopathy.  Skin:    General: Skin is warm and dry.  Neurological:     Mental Status: She is alert and oriented to person, place, and time.     BP 123/61   Pulse 71   Ht 5\' 6"  (1.676 m)   Wt 217 lb (98.4 kg)   LMP 09/28/2019 (Exact Date)   SpO2 98%   BMI 35.02 kg/m  Wt Readings from Last 3 Encounters:  10/04/19 217 lb (98.4 kg)  06/11/19 218 lb (98.9 kg)  06/08/19 221 lb 7 oz (100.4 kg)     Health Maintenance Due  Topic Date Due  . Hepatitis C Screening   Never done  . COVID-19 Vaccine (1) Never done  . PAP SMEAR-Modifier  09/12/2019    There are no preventive care reminders to display for this patient.  Lab Results  Component Value Date   TSH 1.824 09/24/2011   Lab Results  Component Value Date   WBC 7.9 06/08/2019   HGB 14.1 06/08/2019   HCT 42.6 06/08/2019   MCV 92.6 06/08/2019   PLT 311 06/08/2019   Lab Results  Component Value Date   NA 141 06/08/2019   K 4.0 06/08/2019   CO2 25 06/08/2019   GLUCOSE 109 (H) 06/08/2019   BUN 14 06/08/2019   CREATININE 0.61 06/08/2019   BILITOT 0.4 06/08/2019   ALKPHOS 55 06/08/2019   AST 18 06/08/2019   ALT 20 06/08/2019   PROT 7.0 06/08/2019   ALBUMIN 3.8 06/08/2019   CALCIUM 8.7 (L) 06/08/2019   ANIONGAP 9 06/08/2019   Lab Results  Component Value Date   CHOL 202 (H) 11/03/2017   Lab Results  Component Value Date   HDL 44 (L) 11/03/2017   Lab Results  Component Value Date   LDLCALC 126 (H) 11/03/2017   Lab Results  Component Value Date   TRIG 198 (H) 11/03/2017   Lab Results  Component Value Date   CHOLHDL 4.6 11/03/2017   No results found for: HGBA1C    Assessment & Plan:   Problem List Items Addressed This Visit  Other   BMI 35.0-35.9,adult    Other Visit Diagnoses    Routine general medical examination at a health care facility    -  Primary   Relevant Orders   Cytology - PAP   COMPLETE METABOLIC PANEL WITH GFR   Lipid panel   CBC   Screening for cervical cancer       Relevant Orders   Cytology - PAP   Screening for malignant neoplasm of colon       Relevant Orders   Cologuard     Keep up a regular exercise program and make sure you are eating a healthy diet Try to eat 4 servings of dairy a day, or if you are lactose intolerant take a calcium with vitamin D daily.  Your vaccines are up to date.  Tdap given today. Will monitor for sx. Has LN swelling and local reaction in the past.    BMI 35 - discussed strategies around weight loss  including portion control and low carb and getting adequate protein in. Discussed established a regular exercise routine.     No orders of the defined types were placed in this encounter.   Follow-up: Return in about 1 year (around 10/03/2020) for Wellness EXam .    Nani Gasser, MD

## 2019-10-04 NOTE — Progress Notes (Signed)
Pt given Tdap and monitored for reaction she did well there were no signs of adverse reaction at the injection site or with pt's breathing,

## 2019-10-05 ENCOUNTER — Encounter: Payer: Self-pay | Admitting: Rehabilitative and Restorative Service Providers"

## 2019-10-05 ENCOUNTER — Ambulatory Visit (INDEPENDENT_AMBULATORY_CARE_PROVIDER_SITE_OTHER): Payer: BC Managed Care – PPO | Admitting: Rehabilitative and Restorative Service Providers"

## 2019-10-05 DIAGNOSIS — M25562 Pain in left knee: Secondary | ICD-10-CM

## 2019-10-05 DIAGNOSIS — R600 Localized edema: Secondary | ICD-10-CM

## 2019-10-05 DIAGNOSIS — R2689 Other abnormalities of gait and mobility: Secondary | ICD-10-CM | POA: Diagnosis not present

## 2019-10-05 DIAGNOSIS — M6281 Muscle weakness (generalized): Secondary | ICD-10-CM | POA: Diagnosis not present

## 2019-10-05 LAB — CYTOLOGY - PAP
Comment: NEGATIVE
Diagnosis: NEGATIVE
High risk HPV: NEGATIVE

## 2019-10-05 NOTE — Therapy (Signed)
[facility] [address] [place], [place], [address] Phone: [phone]   Fax:  [phone]  Physical Therapy Treatment  Patient Details  Name: Briana Parks MRN: [id number] Date of Birth: Jan 15, 1966 Referring Provider (PT): Jodi Geralds, MD   Encounter Date: 10/05/2019   PT End of Session - 10/05/19 1017    Visit Number 31    Number of Visits 36    Date for PT Re-Evaluation 10/17/19    PT Start Time 0930    PT Stop Time 1020    PT Time Calculation (min) 50 min    Activity Tolerance Patient tolerated treatment well    Behavior During Therapy [facility] for tasks assessed/performed           Past Medical History:  Diagnosis Date  . ALLERGIC RHINITIS CAUSE UNSPECIFIED 08/03/2008   Qualifier: Diagnosis of  By: Thomos Lemons    . Arachnoid cyst   . Deviated septum    large turbinates  . Gestational diabetes   . Headache 09/30/2013  . 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 Kidney infection   . Low blood pressure   . Obstructive sleep apnea 7cm water pressure CPAP 10/24/2010   Moderate. Sleep study was performed at [facility] neurological clinic by Dr. Marliss Coots on 10/02/2010. AHI was 19.8 with desaturations to 79%. Frequent severe snoring. Moderate wedge works associated with respiratory events. Difficulty initiating and maintaining sleep with no slow wave sleep. She had severe OSA and REM.   . Osteoarthritis of left knee 10/26/2013  . Plantar fasciitis   . Pneumonia    history of  . PONV (postoperative nausea and vomiting)    hard to wake up after anesthesia, did not get PONV after 2017 surgery at [facility]. notes care everywhere  . Recurrent UTI   . Rosacea 09/13/2010  . Scoliosis   . Tinnitus   . Vertigo     Past Surgical History:  Procedure Laterality Date  . bone spur removal Right 02/17/2012   foot  . bone spur removal Left 08/2015  . HERNIA REPAIR Bilateral   . KNEE ARTHROSCOPY Left 03/16/14   [facility]   . TONSILLECTOMY AND ADENOIDECTOMY    . TOTAL KNEE ARTHROPLASTY Left 06/11/2019   Procedure: TOTAL KNEE ARTHROPLASTY;  Surgeon: Jodi Geralds, MD;  Location: [facility];  Service: Orthopedics;  Laterality: Left;  . TUBAL LIGATION      There were no vitals filed for this visit.   Subjective Assessment - 10/05/19 0932    Subjective The patient reports she is doing ther ex at home.    Patient Stated Goals normalize gait    Currently in Pain? No/denies              [facility] PT Assessment - 10/05/19 0942      AROM   Left Knee Extension -7      PROM   Left Knee Extension -4    Left Knee Flexion 110                         [facility] Adult PT Treatment/Exercise - 10/05/19 0936      Exercises   Exercises Knee/Hip      Knee/Hip Exercises: Stretches   Gastroc Stretch Both;1 rep;60 seconds    Gastroc Stretch Limitations slant board      Knee/Hip Exercises: Aerobic   Elliptical L1 x 2 minutes for warm up  Knee/Hip Exercises: Standing   Heel Raises Left;20 reps    Heel Raises Limitations UE support    Knee Flexion Strengthening;Left;10 reps    Hip Abduction Stengthening;Right;Left;15 reps    Forward Step Up 10 reps;Left    Forward Step Up Limitations to an 11" surface with unilateral UE support    Step Down Right;10 reps    Step Down Limitations for eccentric L loading    Functional Squat 10 reps    Functional Squat Limitations chair bumps at low point of squat    SLS L SLS      Knee/Hip Exercises: Prone   Hamstring Curl 15 reps    Hip Extension Strengthening;Left;10 reps    Contract/Relax to Increase Flexion end range PROM into knee flexion and contract/relax      Vasopneumatic   Number Minutes Vasopneumatic  10 minutes    Vasopnuematic Location  Knee    Vasopneumatic Pressure Medium    Vasopneumatic Temperature  34 deg      Manual Therapy   Manual Therapy Joint mobilization    Manual therapy comments to imrpove end range motion     Joint Mobilization grade II tibia anterior glide on femur, prone and supine end range mobilizations grade II-III                       PT Long Term Goals - 10/05/19 1010      PT LONG TERM GOAL #1   Title The patient will be indep with HEP.    Time 4    Period Weeks    Status Revised    Target Date 10/17/19      PT LONG TERM GOAL #2   Title The patient will demonstrate equal stance time per observation with gait activities.    Time 4    Period Weeks    Status Revised      PT LONG TERM GOAL #3   Title The patient will improve AROM Lt knee to 3-110 degrees.    Baseline 4-107 (passive) Left knee flexion    Status Revised      PT LONG TERM GOAL #4   Status --                 Plan - 10/05/19 1013    Clinical Impression Statement The patient made gains on L knee flexion PROM to 110 degrees. PT and patient discussed d/c'ing next week and she is to emphasize foot/knee position during function (bend foot up under bottom before standing).  Plan to progress HEP and work to remaining LTGs.    Rehab Potential Good    PT Frequency 2x / week    PT Duration 4 weeks    PT Treatment/Interventions ADLs/Self Care Home Management;Cryotherapy;Iontophoresis 4mg /ml Dexamethasone;Moist Heat;Neuromuscular re-education;Therapeutic exercise;Therapeutic activities;Gait training;Stair training;Functional mobility training;Patient/family education;Taping;Vasopneumatic Device;Passive range of motion;Manual techniques;Dry needling    PT Next Visit Plan d/c'ing  next week, continue emphasizing:  end range mobility, joint mobilization, home program for self mobilization, normalizing gait    PT Home Exercise Plan Access Code: [email or web address]    Consulted and Agree with Plan of Care Patient           Patient will benefit from skilled therapeutic intervention in order to improve the following deficits and impairments:  Abnormal gait, Increased fascial restricitons, Decreased range of motion,  Decreased strength, Decreased activity tolerance, Pain, Hypomobility, Impaired flexibility, Increased edema  Visit Diagnosis: Acute pain of left knee  Muscle weakness (generalized)  Other abnormalities of gait and mobility  Localized edema     Problem List Patient Active Problem List   Diagnosis Date Noted  . BMI 35.0-35.9,adult 10/04/2019  . Status post left knee surgery 06/11/2019  . Primary osteoarthritis of first carpometacarpal joint of left hand 01/26/2019  . Abnormal MRI of head 10/03/2015  . Primary osteoarthritis of left knee 10/26/2013  . Headache 09/30/2013  . Obstructive sleep apnea 7cm water pressure CPAP 10/24/2010  . Rosacea 09/13/2010  . ALLERGIC RHINITIS CAUSE UNSPECIFIED 08/03/2008  . SPONDYLOLISTHESIS, ACQUIRED 05/21/2006  . SCOLIOSIS 12/31/2005  . SHOULDER/ARM SPRAIN/STRAIN, UNSPEC. 12/31/2005    Armilda Vanderlinden , PT 10/05/2019, 10:22 AM  [facility] [address] [place], [place], [address] Phone: [phone]   Fax:  [phone]  Name: Briana Parks MRN: [id number] Date of Birth: [birth year]

## 2019-10-06 NOTE — Progress Notes (Signed)
Call patient: Your Pap smear is normal. Repeat in 5 years.

## 2019-10-07 LAB — COMPLETE METABOLIC PANEL WITH GFR
AG Ratio: 1.5 (calc) (ref 1.0–2.5)
ALT: 31 U/L — ABNORMAL HIGH (ref 6–29)
AST: 21 U/L (ref 10–35)
Albumin: 4 g/dL (ref 3.6–5.1)
Alkaline phosphatase (APISO): 69 U/L (ref 37–153)
BUN: 9 mg/dL (ref 7–25)
CO2: 26 mmol/L (ref 20–32)
Calcium: 8.9 mg/dL (ref 8.6–10.4)
Chloride: 103 mmol/L (ref 98–110)
Creat: 0.66 mg/dL (ref 0.50–1.05)
GFR, Est African American: 118 mL/min/{1.73_m2} (ref >=60)
GFR, Est Non African American: 102 mL/min/{1.73_m2} (ref >=60)
Globulin: 2.7 g/dL (calc) (ref 1.9–3.7)
Glucose, Bld: 92 mg/dL (ref 65–99)
Potassium: 4.5 mmol/L (ref 3.5–5.3)
Sodium: 137 mmol/L (ref 135–146)
Total Bilirubin: 0.6 mg/dL (ref 0.2–1.2)
Total Protein: 6.7 g/dL (ref 6.1–8.1)

## 2019-10-07 LAB — CBC
HCT: 43.5 % (ref 35.0–45.0)
Hemoglobin: 14.6 g/dL (ref 11.7–15.5)
MCH: 30.3 pg (ref 27.0–33.0)
MCHC: 33.6 g/dL (ref 32.0–36.0)
MCV: 90.2 fL (ref 80.0–100.0)
MPV: 10 fL (ref 7.5–12.5)
Platelets: 292 10*3/uL (ref 140–400)
RBC: 4.82 10*6/uL (ref 3.80–5.10)
RDW: 12.8 % (ref 11.0–15.0)
WBC: 9 10*3/uL (ref 3.8–10.8)

## 2019-10-07 LAB — LIPID PANEL
Cholesterol: 173 mg/dL (ref <200)
HDL: 41 mg/dL — ABNORMAL LOW (ref >=50)
LDL Cholesterol (Calc): 105 mg/dL (calc) — ABNORMAL HIGH
Non-HDL Cholesterol (Calc): 132 mg/dL (calc) — ABNORMAL HIGH (ref <130)
Total CHOL/HDL Ratio: 4.2 (calc) (ref <5.0)
Triglycerides: 152 mg/dL — ABNORMAL HIGH (ref <150)

## 2019-10-08 ENCOUNTER — Other Ambulatory Visit: Payer: Self-pay

## 2019-10-08 ENCOUNTER — Ambulatory Visit (INDEPENDENT_AMBULATORY_CARE_PROVIDER_SITE_OTHER): Payer: BC Managed Care – PPO | Admitting: Rehabilitative and Restorative Service Providers"

## 2019-10-08 ENCOUNTER — Encounter: Payer: Self-pay | Admitting: Rehabilitative and Restorative Service Providers"

## 2019-10-08 ENCOUNTER — Other Ambulatory Visit: Payer: Self-pay | Admitting: *Deleted

## 2019-10-08 DIAGNOSIS — R600 Localized edema: Secondary | ICD-10-CM | POA: Diagnosis not present

## 2019-10-08 DIAGNOSIS — M25562 Pain in left knee: Secondary | ICD-10-CM | POA: Diagnosis not present

## 2019-10-08 DIAGNOSIS — R899 Unspecified abnormal finding in specimens from other organs, systems and tissues: Secondary | ICD-10-CM

## 2019-10-08 DIAGNOSIS — M6281 Muscle weakness (generalized): Secondary | ICD-10-CM | POA: Diagnosis not present

## 2019-10-08 DIAGNOSIS — R2689 Other abnormalities of gait and mobility: Secondary | ICD-10-CM

## 2019-10-08 DIAGNOSIS — R7401 Elevation of levels of liver transaminase levels: Secondary | ICD-10-CM

## 2019-10-08 NOTE — Therapy (Signed)
[facility] [place] [address] [place], [place], [address] Phone: [phone]   Fax:  [phone]  Physical Therapy Treatment  Patient Details  Name: Briana Parks MRN: [id number] Date of Birth: 1965/12/19 Referring Provider (PT): Jodi Geralds, MD   Encounter Date: 10/08/2019   PT End of Session - 10/08/19 1715    Visit Number 32    Number of Visits 36    Date for PT Re-Evaluation 10/17/19    PT Start Time 0930    PT Stop Time 1017    PT Time Calculation (min) 47 min    Activity Tolerance Patient tolerated treatment well    Behavior During Therapy [facility] for tasks assessed/performed           Past Medical History:  Diagnosis Date  . ALLERGIC RHINITIS CAUSE UNSPECIFIED 08/03/2008   Qualifier: Diagnosis of  By: Thomos Lemons    . Arachnoid cyst   . Deviated septum    large turbinates  . Gestational diabetes   . Headache 09/30/2013  . 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 Kidney infection   . Low blood pressure   . Obstructive sleep apnea 7cm water pressure CPAP 10/24/2010   Moderate. Sleep study was performed at [facility] neurological clinic by Dr. Marliss Coots on 10/02/2010. AHI was 19.8 with desaturations to 79%. Frequent severe snoring. Moderate wedge works associated with respiratory events. Difficulty initiating and maintaining sleep with no slow wave sleep. She had severe OSA and REM.   . Osteoarthritis of left knee 10/26/2013  . Plantar fasciitis   . Pneumonia    history of  . PONV (postoperative nausea and vomiting)    hard to wake up after anesthesia, did not get PONV after 2017 surgery at [facility]. notes care everywhere  . Recurrent UTI   . Rosacea 09/13/2010  . Scoliosis   . Tinnitus   . Vertigo     Past Surgical History:  Procedure Laterality Date  . bone spur removal Right 02/17/2012   foot  . bone spur removal Left 08/2015  . HERNIA REPAIR Bilateral   . KNEE ARTHROSCOPY Left 03/16/14   [facility]   . TONSILLECTOMY AND ADENOIDECTOMY    . TOTAL KNEE ARTHROPLASTY Left 06/11/2019   Procedure: TOTAL KNEE ARTHROPLASTY;  Surgeon: Jodi Geralds, MD;  Location: [facility];  Service: Orthopedics;  Laterality: Left;  . TUBAL LIGATION      There were no vitals filed for this visit.   Subjective Assessment - 10/08/19 0932    Subjective The patient has been working in quadriped to increase end range knee flexion.    Patient Stated Goals normalize gait    Currently in Pain? Yes   notes a cramp in her R hip this morning   Pain Descriptors / Indicators Tingling    Pain Type Surgical pain;Chronic pain    Pain Onset More than a month ago    Pain Frequency Intermittent    Aggravating Factors  was on knees and did a lot of stretching yesterday    Pain Relieving Factors rest, ice              [facility] PT Assessment - 10/08/19 0933      Assessment   Medical Diagnosis L total knee arthroplasty    Referring Provider (PT) Jodi Geralds, MD    Onset Date/Surgical Date 06/11/19    Hand Dominance Left  [facility] Adult PT Treatment/Exercise - 10/08/19 0935      Ambulation/Gait   Ambulation/Gait Yes    Ambulation/Gait Assistance 7: Independent    Gait Comments cues for equal weight shifting      Exercises   Exercises Knee/Hip      Knee/Hip Exercises: Stretches   Piriformis Stretch Right;30 seconds;2 reps    Piriformis Stretch Limitations patient arrived reporting muscle tightness in R gluts    Gastroc Stretch Both;30 seconds    Gastroc Stretch Limitations slant board      Knee/Hip Exercises: Aerobic   Elliptical L2 x 4 minutes      Knee/Hip Exercises: Standing   Heel Raises Left;10 reps    Heel Raises Limitations off edge of step    Knee Flexion Strengthening;Left;10 reps    Wall Squat 10 reps    Wall Squat Limitations with isometric holds    Other Standing Knee Exercises heel  walking, high marches x 20 feet x 3 reps each       Knee/Hip Exercises: Seated   Long Arc Quad Left;10 reps      Knee/Hip Exercises: Supine   Quad Sets Left;Strengthening;5 reps    Quad Sets Limitations *patient was compensating with L gluts to try to elicit quad contraction.  PT added e-stim today for muscle re-education and then performed quad sets with e-stim and then with resisted patella to encourage superior patella translation with quad set      Modalities   Modalities Electrical Stimulation   performed with ther ex for quad contraction     Electrical Stimulation   Electrical Stimulation Location asymmetrical biphasic waveform    Electrical Stimulation Action for quad contraction moving electrodes from rectus femoris to VM    Electrical Stimulation Parameters to tolerance    Electrical Stimulation Goals Strength;Neuromuscular facilitation      Manual Therapy   Manual Therapy Soft tissue mobilization;Passive ROM;Muscle Energy Technique;Manual Traction    Manual therapy comments to improve end range motion    Joint Mobilization --    Soft tissue mobilization HS STM and IASTm and gastroc STM and IASTM    Myofascial Release hamstrings    Passive ROM overpressure for flexion and extension    Muscle Energy Technique contract/relax for end range NMR                       PT Long Term Goals - 10/08/19 1715      PT LONG TERM GOAL #1   Title The patient will be indep with HEP.    Time 4    Period Weeks    Status Revised    Target Date 10/17/19      PT LONG TERM GOAL #2   Title The patient will demonstrate equal stance time per observation with gait activities.    Time 4    Period Weeks    Status Revised      PT LONG TERM GOAL #3   Title The patient will improve AROM Lt knee to 3-110 degrees.    Baseline 4-107 (passive) Left knee flexion    Status Revised                 Plan - 10/08/19 1719    Clinical Impression Statement The patient continues to have inconsistent quad contraction to get terminal  extension of the knee.  PT added e-stim to today's treatment session (will send cert order).  Plan to d/c next week emphasizing end range motion, progression  of HEP and post d/c plan.    Rehab Potential Good    PT Frequency 2x / week    PT Duration 4 weeks    PT Treatment/Interventions ADLs/Self Care Home Management;Cryotherapy;Iontophoresis 4mg /ml Dexamethasone;Moist Heat;Neuromuscular re-education;Therapeutic exercise;Therapeutic activities;Gait training;Stair training;Functional mobility training;Patient/family education;Taping;Vasopneumatic Device;Passive range of motion;Manual techniques;Dry needling;Electrical Stimulation    PT Next Visit Plan d/c'ing  next week, continue emphasizing:  end range mobility, joint mobilization, home program for self mobilization, normalizing gait, *added e-stim for quad motor re-ed    PT Home Exercise Plan Access Code: [email or web address]    Consulted and Agree with Plan of Care Patient           Patient will benefit from skilled therapeutic intervention in order to improve the following deficits and impairments:  Abnormal gait, Increased fascial restricitons, Decreased range of motion, Decreased strength, Decreased activity tolerance, Pain, Hypomobility, Impaired flexibility, Increased edema  Visit Diagnosis: Acute pain of left knee  Muscle weakness (generalized)  Other abnormalities of gait and mobility  Localized edema     Problem List Patient Active Problem List   Diagnosis Date Noted  . BMI 35.0-35.9,adult 10/04/2019  . Status post left knee surgery 06/11/2019  . Primary osteoarthritis of first carpometacarpal joint of left hand 01/26/2019  . Abnormal MRI of head 10/03/2015  . Primary osteoarthritis of left knee 10/26/2013  . Headache 09/30/2013  . Obstructive sleep apnea 7cm water pressure CPAP 10/24/2010  . Rosacea 09/13/2010  . ALLERGIC RHINITIS CAUSE UNSPECIFIED 08/03/2008  . SPONDYLOLISTHESIS, ACQUIRED 05/21/2006  . SCOLIOSIS 12/31/2005  .  SHOULDER/ARM SPRAIN/STRAIN, UNSPEC. 12/31/2005    Isatou Agredano, PT 10/08/2019, 5:21 PM  [facility] [address] [place], [place], [address] Phone: [phone]   Fax:  [phone]  Name: Briana Parks MRN: [id number] Date of Birth: 01/17/1966

## 2019-10-11 ENCOUNTER — Ambulatory Visit (INDEPENDENT_AMBULATORY_CARE_PROVIDER_SITE_OTHER): Payer: BC Managed Care – PPO | Admitting: Neurology

## 2019-10-11 ENCOUNTER — Encounter: Payer: Self-pay | Admitting: Neurology

## 2019-10-11 VITALS — BP 128/76 | HR 74 | Ht 66.0 in | Wt 221.0 lb

## 2019-10-11 DIAGNOSIS — R519 Headache, unspecified: Secondary | ICD-10-CM | POA: Diagnosis not present

## 2019-10-11 DIAGNOSIS — R930 Abnormal findings on diagnostic imaging of skull and head, not elsewhere classified: Secondary | ICD-10-CM | POA: Diagnosis not present

## 2019-10-11 MED ORDER — PROPRANOLOL HCL 20 MG PO TABS: 20.0000 mg | ORAL_TABLET | Freq: Two times a day (BID) | ORAL | 4 refills | Status: DC

## 2019-10-11 NOTE — Progress Notes (Signed)
HISTORY OF PRESENT ILLNESS: Briana Parks is a 54 year old right-handed Caucasian female, accompanied by her husband, referred by the primary care physician Dr. Eppie Parks for evaluation of headaches, review of her MRI She had a past medical history of occasionally headaches, bilateral frontal, pressure, couple times a month, was no light noise sensitivity, Tylenol or Advil has been very helpful, trigger for her headaches are usually Briana Parks, allergies, Over the past 2 years, she also developed postcoital headaches, and this headache is located at the vertex region, severe, pounding, as if her head is going to explode, she usually goes to sleep afterwards, wake up will be much better, there was no focal deficit during the episodes, it happened about 50% to time. For that reason, she received MRI of the brain in September 17 2013 at [facility], we have reviewed the films together,The right lateral ventricle is larger than the left without corresponding encephalomalacia. 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  Otherwise unremarkable MRI of the brain.  Unremarkable MRA of the intracranial circulation. Specifically, no evidence for intracranial aneurysm. She was given indomethacin 25 mg as needed for headaches, was not sure it was helpful She has history of one grand mal seizure in 1994, had MRI of the brain in [place] at that time, was told she had "a black spot in the brain, likely due to previous trauma." She was getting prescription of Tegretol for a while, no recurrent seizure  She was seen by nurse practitioner over the past few years, her symptoms has been stable, occasionally postcoital headaches, was put on propanolol 25 mg twice a day, responding well, only has headaches every few months, ibuprofen as needed was helpful  Personally reviewed MRI of brain in 2015, compared to MRI of the brain without contrast in July  2017, unchanged enlargement of the right lateral ventricle, no abnormality seen.  She recently had left knee replacement, recovering well  REVIEW OF SYSTEMS: Out of a complete 14 system review of symptoms, the patient complains only of the following symptoms, and all other reviewed systems are negative.  Headache   ALLERGIES: Allergies  Allergen Reactions  . Eggs Or Egg-Derived Products   . Sulfamethoxazole-Trimethoprim Hives  . Tdap [Tetanus-Diphth-Acell Pertussis] Other (See Comments)    Skin reaction with lymph node sweeling    HOME MEDICATIONS: Outpatient Medications Prior to Visit  Medication Sig Dispense Refill  . AMBULATORY NON FORMULARY MEDICATION Medication Name: 7 cm water pressure CPAP, heated/humidified machine.   Dx : Severe OSA. 1 Units 0  . loratadine (CLARITIN) 10 MG tablet Take 10 mg by mouth at bedtime.    . meloxicam (MOBIC) 15 MG tablet Take 1 tablet (15 mg total) by mouth daily as needed for pain. 30 tablet 3  . Multiple Vitamin (MULTIVITAMIN WITH MINERALS) TABS tablet Take 1 tablet by mouth daily.    Marland Kitchen NORTREL 7/7/7 0.5/0.75/1-35 MG-MCG tablet TAKE 1 TABLET EVERY DAY 84 tablet 3  . nystatin (MYCOSTATIN/NYSTOP) powder Apply topically 4 (four) times daily. (Patient taking differently: Apply 1 application topically 4 (four) times daily as needed (skin irritation/rash). ) 60 g 1  . nystatin cream (MYCOSTATIN) Apply 1 application topically 2 (two) times daily. (Patient taking differently: Apply 1 application topically 2 (two) times daily as needed (skin irritation/rash). ) 45 g 1  . propranolol (INDERAL) 20 MG tablet TAKE 1 TABLET BY MOUTH TWICE A DAY 180 tablet 3   No facility-administered  medications prior to visit.    PAST MEDICAL HISTORY: Past Medical History:  Diagnosis Date  . ALLERGIC RHINITIS CAUSE UNSPECIFIED 08/03/2008   Qualifier: Diagnosis of  By: Briana Parks, Briana Parks    . Arachnoid cyst   . Deviated septum    large turbinates  . Gestational diabetes     . Headache 09/30/2013  . 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 Kidney infection   . Low blood pressure   . Obstructive sleep apnea 7cm water pressure CPAP 10/24/2010   Moderate. [facility] by Dr. Marliss Cootslark Parks on 10/02/2010. AHI was 19.8 with desaturations to 79%. Frequent severe snoring. Moderate wedge works associated with respiratory events. Difficulty initiating and maintaining sleep with no slow wave sleep. She had severe OSA and REM.   . Osteoarthritis of left knee 10/26/2013  . Plantar fasciitis   . Pneumonia    history of  . PONV (postoperative nausea and vomiting)    hard to wake up after anesthesia, did not get PONV [facility]  . Recurrent UTI   . Rosacea 09/13/2010  . Scoliosis   . Tinnitus   . Vertigo     PAST SURGICAL HISTORY: Past Surgical History:  Procedure Laterality Date  . bone spur removal Right 02/17/2012   foot  . bone spur removal Left 08/2015  . HERNIA REPAIR Bilateral   . KNEE ARTHROSCOPY Left 03/16/14   [facility]   . TONSILLECTOMY AND ADENOIDECTOMY    . TOTAL KNEE ARTHROPLASTY Left 06/11/2019   Procedure: TOTAL KNEE ARTHROPLASTY;  Surgeon: Briana Parks, John, MD;  Location: [facility];  Service: Orthopedics;  Laterality: Left;  . TUBAL LIGATION      FAMILY HISTORY: Family History  Problem Relation Age of Onset  . Arthritis Mother   . Fibromyalgia Mother   . Cancer Mother        liver small intestine carcinoid tumor    SOCIAL HISTORY: Social History   Socioeconomic History  . Marital status: Married    Spouse name: Briana Parks  . Number of children: 2  . Years of education: college  . Highest education level: Not on file  Occupational History    Employer: [employer]    Comment: [employer]  Tobacco Use  . Smoking status: Never Smoker  . Smokeless tobacco: Never Used  Vaping Use  . Vaping Use: Never used  Substance and Sexual  Activity  . Alcohol use: Yes    Comment: mixed drinks  . Drug use: No  . Sexual activity: Not on file    Comment: cake decorator, 2 yrs college, married, 2 children, 4 sodas daily, no regular exercise  Other Topics Concern  . Not on file  Social History Narrative   Patient lives at home with her husband Briana Fearing(James)   Education two years of college education.   Patient works for [employer]. [employer]   Right handed.   Caffeine three cups of coke daily.         Social Determinants of Health   Financial Resource Strain:   . Difficulty of Paying Living Expenses:   Food Insecurity:   . Worried About Programme researcher, broadcasting/film/videounning Out of Food in the Last Year:   . Baristaan Out of Food in the Last Year:   Transportation Needs:   . Freight forwarderLack of Transportation (Medical):   Marland Kitchen. Lack of Transportation (Non-Medical):   Physical Activity:   . Days of  Exercise per Week:   . Minutes of Exercise per Session:   Stress:   . Feeling of Stress :   Social Connections:   . Frequency of Communication with Friends and Family:   . Frequency of Social Gatherings with Friends and Family:   . Attends Religious Services:   . Active Member of Clubs or Organizations:   . Attends Banker Meetings:   Marland Kitchen Marital Status:   Intimate Partner Violence:   . Fear of Current or Ex-Partner:   . Emotionally Abused:   Marland Kitchen Physically Abused:   . Sexually Abused:       PHYSICAL EXAM  Vitals:   10/11/19 1302  BP: 128/76  Pulse: 74  Weight: 221 lb (100.2 kg)  Height: 5\' 6"  (1.676 m)   Body mass index is 35.67 kg/m.   PHYSICAL EXAMNIATION:  Gen: NAD, conversant, well nourised, well groomed                     Cardiovascular: Regular rate rhythm, no peripheral edema, warm, nontender. Eyes: Conjunctivae clear without exudates or hemorrhage Neck: Supple, no carotid bruits. Pulmonary: Clear to auscultation bilaterally   NEUROLOGICAL EXAM:  MENTAL STATUS: Speech/Cognition: Awake, alert, normal speech, oriented to history taking  and casual conversation.  CRANIAL NERVES: CN II: Visual fields are full to confrontation.  Pupils are round equal and briskly reactive to light. CN III, IV, VI: extraocular movement are normal. No ptosis. CN V: Facial sensation is intact to light touch. CN VII: Face is symmetric with normal eye closure and smile. CN VIII: Hearing is normal to casual conversation CN IX, X: Palate elevates symmetrically. Phonation is normal. CN XI: Head turning and shoulder shrug are intact CN XII: Tongue is midline with normal movements and no atrophy.  MOTOR: Muscle bulk and tone are normal. Muscle strength is normal.  Well-healed left knee longitudinal scar  REFLEXES: Reflexes are 2  and symmetric at the biceps, triceps, knees and ankles. Plantar responses are flexor.  SENSORY: Intact to light touch, pinprick, positional and vibratory sensation at fingers and toes.  COORDINATION: There is no trunk or limb ataxia.    GAIT/STANCE: Need push-up to get up from seated position, steady  DIAGNOSTIC DATA (LABS, IMAGING, TESTING) - I reviewed patient records, labs, notes, testing and imaging myself where available.  Lab Results  Component Value Date   WBC 9.0 10/06/2019   HGB 14.6 10/06/2019   HCT 43.5 10/06/2019   MCV 90.2 10/06/2019   PLT 292 10/06/2019      Component Value Date/Time   NA 137 10/06/2019 0905   K 4.5 10/06/2019 0905   CL 103 10/06/2019 0905   CO2 26 10/06/2019 0905   GLUCOSE 92 10/06/2019 0905   BUN 9 10/06/2019 0905   CREATININE 0.66 10/06/2019 0905   CALCIUM 8.9 10/06/2019 0905   PROT 6.7 10/06/2019 0905   ALBUMIN 3.8 06/08/2019 0953   AST 21 10/06/2019 0905   ALT 31 (H) 10/06/2019 0905   ALKPHOS 55 06/08/2019 0953   BILITOT 0.6 10/06/2019 0905   GFRNONAA 102 10/06/2019 0905   GFRAA 118 10/06/2019 0905   Lab Results  Component Value Date   CHOL 173 10/06/2019   HDL 41 (L) 10/06/2019   LDLCALC 105 (H) 10/06/2019   TRIG 152 (H) 10/06/2019   CHOLHDL 4.2  10/06/2019   No results found for: HGBA1C No results found for: VITAMINB12 Lab Results  Component Value Date   TSH 1.824 09/24/2011  ASSESSMENT AND PLAN 54 y.o. year old female   Episodic headache Overall under good control -Continue Inderal 20 mg twice daily, can take extra propanolol with postcoital headache since this helps As needed NSAIDs  Abnormal MRI of the brain  -MRI of the brian in 2017, unchanged enlargement of the right lateral ventricle stable compared to previous scan in 2015   Levert Feinstein, M.D. Ph.D.  [facility] [address] [place], [place] [address] Phone: [phone] Fax:      [phone]

## 2019-10-13 ENCOUNTER — Other Ambulatory Visit: Payer: Self-pay

## 2019-10-13 ENCOUNTER — Ambulatory Visit (INDEPENDENT_AMBULATORY_CARE_PROVIDER_SITE_OTHER): Payer: BC Managed Care – PPO | Admitting: Physical Therapy

## 2019-10-13 DIAGNOSIS — R2689 Other abnormalities of gait and mobility: Secondary | ICD-10-CM

## 2019-10-13 DIAGNOSIS — M25562 Pain in left knee: Secondary | ICD-10-CM | POA: Diagnosis not present

## 2019-10-13 DIAGNOSIS — R600 Localized edema: Secondary | ICD-10-CM | POA: Diagnosis not present

## 2019-10-13 DIAGNOSIS — M6281 Muscle weakness (generalized): Secondary | ICD-10-CM

## 2019-10-13 NOTE — Therapy (Signed)
[facility] [place] [address] [place], [place], [address] Phone: [phone]   Fax:  [phone]  Physical Therapy Treatment  Patient Details  Name: Briana Parks MRN: [id number] Date of Birth: January 07, 1966 Referring Provider (PT): Jodi Geralds, MD   Encounter Date: 10/13/2019   PT End of Session - 10/13/19 1102    Visit Number 33    Number of Visits 36    Date for PT Re-Evaluation 10/17/19    PT Start Time 1018    PT Stop Time 1108    PT Time Calculation (min) 50 min    Activity Tolerance Patient tolerated treatment well    Behavior During Therapy [facility] for tasks assessed/performed           Past Medical History:  Diagnosis Date  . ALLERGIC RHINITIS CAUSE UNSPECIFIED 08/03/2008   Qualifier: Diagnosis of  By: Thomos Lemons    . Arachnoid cyst   . Deviated septum    large turbinates  . Gestational diabetes   . Headache 09/30/2013  . 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 Kidney infection   . Low blood pressure   . Obstructive sleep apnea 7cm water pressure CPAP 10/24/2010   Moderate. Sleep study was performed at [facility] neurological clinic by Dr. Marliss Coots on 10/02/2010. AHI was 19.8 with desaturations to 79%. Frequent severe snoring. Moderate wedge works associated with respiratory events. Difficulty initiating and maintaining sleep with no slow wave sleep. She had severe OSA and REM.   . Osteoarthritis of left knee 10/26/2013  . Plantar fasciitis   . Pneumonia    history of  . PONV (postoperative nausea and vomiting)    hard to wake up after anesthesia, did not get PONV after 2017 surgery at [facility]. notes care everywhere  . Recurrent UTI   . Rosacea 09/13/2010  . Scoliosis   . Tinnitus   . Vertigo     Past Surgical History:  Procedure Laterality Date  . bone spur removal Right 02/17/2012   foot  . bone spur removal Left 08/2015  . HERNIA REPAIR Bilateral   . KNEE ARTHROSCOPY Left 03/16/14   [facility]   . TONSILLECTOMY AND ADENOIDECTOMY    . TOTAL KNEE ARTHROPLASTY Left 06/11/2019   Procedure: TOTAL KNEE ARTHROPLASTY;  Surgeon: Jodi Geralds, MD;  Location: [facility];  Service: Orthopedics;  Laterality: Left;  . TUBAL LIGATION      There were no vitals filed for this visit.   Subjective Assessment - 10/13/19 1027    Subjective Pt reports she was up and down a ladder yesterday while painting room yesterday.  She was sore for 2 days after last session (after estim for quad contraction).    Patient Stated Goals normalize gait    Currently in Pain? Yes    Pain Score 1     Pain Location Knee    Pain Orientation Left;Anterior    Pain Descriptors / Indicators Tightness    Aggravating Factors  stairs    Pain Relieving Factors rest, ice              [facility] PT Assessment - 10/13/19 0001      Assessment   Medical Diagnosis L total knee arthroplasty    Referring Provider (PT) Jodi Geralds, MD    Onset Date/Surgical Date 06/11/19    Hand Dominance Left    Next MD Visit 10/14/19    Prior Therapy known to our clinic from  prior therapy      AROM   Left Knee Extension -7    Left Knee Flexion 100      PROM   Right/Left Knee Left    Left Knee Extension -3    Left Knee Flexion 110           [facility] Adult PT Treatment/Exercise - 10/13/19 0001      Knee/Hip Exercises: Stretches   Passive Hamstring Stretch Left;3 reps;60 seconds    Hip Flexor Stretch Left;2 reps;30 seconds    Knee: Self-Stretch to increase Flexion Left;5 reps;30 seconds    Gastroc Stretch Both;30 seconds;2 reps    Gastroc Stretch Limitations --      Knee/Hip Exercises: Aerobic   Elliptical L2 x 3 minutes    Other Aerobic single laps around gym (with SPC- 80 ft) in between exercises to decrease stiffness.       Knee/Hip Exercises: Machines for Strengthening   Total Gym Leg Press 15 reps LLE, 3 sec quad set in ext. Seat @ 4, 6 plates      Knee/Hip Exercises: Standing   Heel Raises Left;10  reps;2 sets    Heel Raises Limitations off edge of step    Other Standing Knee Exercises forward lunge with foot on 12" step x 30 sec x 4 reps, followed by hamstring stretch with over pressure into ext x 30 sec        Knee/Hip Exercises: Supine   Quad Sets Left;Strengthening;5 reps   long sitting     Vasopneumatic   Number Minutes Vasopneumatic  10 minutes    Vasopnuematic Location  Knee   L   Vasopneumatic Pressure Medium    Vasopneumatic Temperature  34 deg      Manual Therapy   Manual therapy comments to improve end range motion    Joint Mobilization grade II tibia anterior glide on femur, prone and supine end range mobilizations grade II-III    Soft tissue mobilization IASTM and STM to Lt gastroc and hamstring    Passive ROM overpressure into Lt knee extension    Muscle Energy Technique contract/relax for end range NMR                       PT Long Term Goals - 10/13/19 1318      PT LONG TERM GOAL #1   Title The patient will be indep with HEP.    Time 4    Period Weeks    Status On-going      PT LONG TERM GOAL #2   Title The patient will demonstrate equal stance time per observation with gait activities.    Time 4    Period Weeks    Status On-going      PT LONG TERM GOAL #3   Title The patient will improve AROM Lt knee to 3-110 degrees.    Baseline 3-110 deg (passive) Left knee flexion    Status On-going                 Plan - 10/13/19 1316    Clinical Impression Statement Pt demonstrated improved quad contraction with quad set today.  Pt's Lt knee ROM has been gradually progressing.  Pt returns to MD this week.  Per supervising PT's plan, will d/c this week emphasizing end range motion and finalizing HEP.    Rehab Potential Good    PT Frequency 2x / week    PT Duration 4 weeks    PT Treatment/Interventions  ADLs/Self Care Home Management;Cryotherapy;Iontophoresis 4mg /ml Dexamethasone;Moist Heat;Neuromuscular re-education;Therapeutic  exercise;Therapeutic activities;Gait training;Stair training;Functional mobility training;Patient/family education;Taping;Vasopneumatic Device;Passive range of motion;Manual techniques;Dry needling;Electrical Stimulation    PT Next Visit Plan manual therapy for end range,  finalize HEP.    PT Home Exercise Plan Access Code: [email or web address]    Consulted and Agree with Plan of Care Patient           Patient will benefit from skilled therapeutic intervention in order to improve the following deficits and impairments:  Abnormal gait, Increased fascial restricitons, Decreased range of motion, Decreased strength, Decreased activity tolerance, Pain, Hypomobility, Impaired flexibility, Increased edema  Visit Diagnosis: Acute pain of left knee  Muscle weakness (generalized)  Other abnormalities of gait and mobility  Localized edema     Problem List Patient Active Problem List   Diagnosis Date Noted  . BMI 35.0-35.9,adult 10/04/2019  . Status post left knee surgery 06/11/2019  . Primary osteoarthritis of first carpometacarpal joint of left hand 01/26/2019  . Abnormal MRI of head 10/03/2015  . Primary osteoarthritis of left knee 10/26/2013  . Headache 09/30/2013  . Obstructive sleep apnea 7cm water pressure CPAP 10/24/2010  . Rosacea 09/13/2010  . ALLERGIC RHINITIS CAUSE UNSPECIFIED 08/03/2008  . SPONDYLOLISTHESIS, ACQUIRED 05/21/2006  . SCOLIOSIS 12/31/2005  . SHOULDER/ARM SPRAIN/STRAIN, UNSPEC. 12/31/2005   03/02/2006, PTA 10/13/19 1:19 PM  [facility] [place] [address] [place], [place], [place] Phone: [phone]   Fax:  [phone]  Name: DONNALYN JURAN MRN: Annamarie Major Date of Birth: 05/03/1965

## 2019-10-15 ENCOUNTER — Ambulatory Visit (INDEPENDENT_AMBULATORY_CARE_PROVIDER_SITE_OTHER): Payer: BC Managed Care – PPO | Admitting: Rehabilitative and Restorative Service Providers"

## 2019-10-15 ENCOUNTER — Other Ambulatory Visit: Payer: Self-pay

## 2019-10-15 DIAGNOSIS — R600 Localized edema: Secondary | ICD-10-CM | POA: Diagnosis not present

## 2019-10-15 DIAGNOSIS — M6281 Muscle weakness (generalized): Secondary | ICD-10-CM

## 2019-10-15 DIAGNOSIS — M25562 Pain in left knee: Secondary | ICD-10-CM

## 2019-10-15 DIAGNOSIS — R2689 Other abnormalities of gait and mobility: Secondary | ICD-10-CM

## 2019-10-15 NOTE — Patient Instructions (Signed)
Access Code: [email or web address] URL: [email or web address] Date: 10/15/2019 Prepared by: Margretta Ditty  Exercises Supine Short Arc Quad - 1 x daily - 7 x weekly - 10 reps - 1 sets Long Sitting Quad Set with Towel Roll Under Heel - 1 x daily - 7 x weekly - 1 sets - 10 reps Sidelying Hip Abduction - 1 x daily - 7 x weekly - 10 reps - 1 sets Gastroc Stretch on Wall - 2 x daily - 7 x weekly - 1 sets - 2 reps - 30 seconds hold Prone Knee Extension Hang - 1 x daily - 7 x weekly - 1 sets - 2-3 reps - 15-30 hold Seated Knee Flexion Stretch - 1 x daily - 7 x weekly - 1 sets - 10 reps - 10 hold Sit to Stand - 1 x daily - 7 x weekly - 1 sets - 10 reps Seated Hamstring Stretch with Chair - 2 x daily - 7 x weekly - 1 sets - 3 reps - 30 seconds hold Prone Quadriceps Stretch with Strap - 1 x daily - 7 x weekly - 1 sets - 3 reps - 10 seconds hold Quadruped Knee Flexion Stretch - 1 x daily - 7 x weekly - 1 sets - 3 reps - 30 seconds hold

## 2019-10-15 NOTE — Therapy (Signed)
[place], [place], [address] Phone: [phone]   Fax:  [phone]  Physical Therapy Treatment and Discharge Summary  Patient Details  Name: Briana Parks MRN: [id number] Date of Birth: Oct 12, 1965 Referring Provider (PT): Dorna Leitz, MD   Encounter Date: 10/15/2019   PT End of Session - 10/15/19 1019    Visit Number 34    Number of Visits 36    Date for PT Re-Evaluation 10/17/19    PT Start Time 7782    PT Stop Time 1055    PT Time Calculation (min) 40 min    Activity Tolerance Patient tolerated treatment well    Behavior During Therapy [facility] for tasks assessed/performed           Past Medical History:  Diagnosis Date  . ALLERGIC RHINITIS CAUSE UNSPECIFIED 08/03/2008   Qualifier: Diagnosis of  By: Esmeralda Arthur    . Arachnoid cyst   . Deviated septum    large turbinates  . Gestational diabetes   . Headache 09/30/2013  . 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 Kidney infection   . Low blood pressure   . Obstructive sleep apnea 7cm water pressure CPAP 10/24/2010   Moderate. Sleep study was performed at [facility] neurological clinic by Dr. Pecolia Ades on 10/02/2010. AHI was 19.8 with desaturations to 79%. Frequent severe snoring. Moderate wedge works associated with respiratory events. Difficulty initiating and maintaining sleep with no slow wave sleep. She had severe OSA and REM.   . Osteoarthritis of left knee 10/26/2013  . Plantar fasciitis   . Pneumonia    history of  . PONV (postoperative nausea and vomiting)    hard to wake up after anesthesia, did not get PONV after 2017 surgery at [facility]. notes care everywhere  . Recurrent UTI   . Rosacea 09/13/2010  . Scoliosis   . Tinnitus   . Vertigo     Past Surgical History:  Procedure Laterality Date  . bone spur removal Right 02/17/2012   foot  . bone spur removal Left 08/2015  . HERNIA REPAIR Bilateral   . KNEE  ARTHROSCOPY Left 03/16/14   [facility]   . TONSILLECTOMY AND ADENOIDECTOMY    . TOTAL KNEE ARTHROPLASTY Left 06/11/2019   Procedure: TOTAL KNEE ARTHROPLASTY;  Surgeon: Dorna Leitz, MD;  Location: [facility];  Service: Orthopedics;  Laterality: Left;  . TUBAL LIGATION      There were no vitals filed for this visit.   Subjective Assessment - 10/15/19 1015    Subjective Patient reports that everywhere on her body is tight.  Patient saw MD yesterday and he encouraged her to continue working on straightening in her knee    Patient Stated Goals normalize gait    Currently in Pain? No/denies              [facility] PT Assessment - 10/15/19 1019      Assessment   Medical Diagnosis L total knee arthroplasty    Referring Provider (PT) Dorna Leitz, MD    Onset Date/Surgical Date 06/11/19    Hand Dominance Left      AROM   Left Knee Extension -6    Left Knee Flexion 109      PROM   Left Knee Extension -3    Left Knee Flexion 112  [facility] Adult PT Treatment/Exercise - 10/15/19 1019      Ambulation/Gait   Ambulation/Gait Yes    Ambulation/Gait Assistance 7: Independent    Gait Comments Gait is characterized by dec'd L terminal knee extension at end stance phase, dec'd stance time on the L side.      Exercises   Exercises Knee/Hip      Knee/Hip Exercises: Stretches   Active Hamstring Stretch Left;2 reps;30 seconds    Quad Stretch Left;2 reps;30 seconds    Quad Stretch Limitations contract/relax at end range    Child psychotherapist Limitations slant board      Knee/Hip Exercises: Aerobic   Recumbent Bike L 5:  x 3 minutes      Knee/Hip Exercises: Standing   Heel Raises Limitations Heel walking and toe walking x 20 feet x 3 reps bilat    Knee Flexion Strengthening;Left;10 reps    Knee Flexion Limitations then performed end range contraction beginning with muscle at mid range for re-ed     Hip Flexion Limitations High marching x 10 reps    Other Standing Knee Exercises backwards walking x 20 feet x 3 reps      Knee/Hip Exercises: Seated   Long Arc Quad Left;5 reps    Other Seated Knee/Hip Exercises terminal knee extension x 5 reps with tactile cues      Knee/Hip Exercises: Supine   Quad Sets Strengthening;Left;10 reps    Quad Sets Limitations working on end range contraction.      Manual Therapy   Manual Therapy Soft tissue mobilization;Joint mobilization    Manual therapy comments to improve end range motion    Joint Mobilization grade III end range mobility    Soft tissue mobilization STM gastroc and HS                       PT Long Term Goals - 10/15/19 1020      PT LONG TERM GOAL #1   Title The patient will be indep with HEP.    Time 4    Period Weeks    Status Achieved      PT LONG TERM GOAL #2   Title The patient will demonstrate equal stance time per observation with gait activities.    Time 4    Period Weeks    Status Partially Met      PT LONG TERM GOAL #3   Title The patient will improve AROM Lt knee to 3-110 degrees.    Baseline 3-110 deg (passive) Left knee flexion    Status Partially Met      PT LONG TERM GOAL #4   Title The patient will improve gait speed to > or equal to 2.6 ft/sec for return to full community ambulation without a device.    Baseline 2.67 ft/sec    Status Achieved                 Plan - 10/15/19 1101    Clinical Impression Statement The patient partially met LTGs and has HEP to continue working on end range motion.    Rehab Potential Good    PT Frequency 2x / week    PT Duration 4 weeks    PT Treatment/Interventions ADLs/Self Care Home Management;Cryotherapy;Iontophoresis '4mg'$ /ml Dexamethasone;Moist Heat;Neuromuscular re-education;Therapeutic exercise;Therapeutic activities;Gait training;Stair training;Functional mobility training;Patient/family education;Taping;Vasopneumatic Device;Passive range of  motion;Manual techniques;Dry needling;Electrical Stimulation    PT Next Visit Plan discharge today    PT  Home Exercise Plan Access Code: [email or web address]    [email or web address] and Agree with Plan of Care Patient           Patient will benefit from skilled therapeutic intervention in order to improve the following deficits and impairments:  Abnormal gait, Increased fascial restricitons, Decreased range of motion, Decreased strength, Decreased activity tolerance, Pain, Hypomobility, Impaired flexibility, Increased edema  Visit Diagnosis: Acute pain of left knee  Muscle weakness (generalized)  Other abnormalities of gait and mobility  Localized edema    PHYSICAL THERAPY DISCHARGE SUMMARY  Visits from Start of Care: 34  Current functional level related to goals / functional outcomes: See goals above   Remaining deficits: Continued limitations in end range movement   Education / Equipment: Home program  Plan: Patient agrees to discharge.  Patient goals were partially met. Patient is being discharged due to meeting the stated rehab goals.  ?????         Thank you for the referral of this patient. Rudell Cobb, MPT   [place], [place] 10/15/2019, 11:38 AM  [facility] [place], [place], [address] Phone: [phone]   Fax:  [phone]  Name: Briana Parks MRN: [id number] Date of Birth: 03-19-1966

## 2019-12-03 ENCOUNTER — Other Ambulatory Visit: Payer: Self-pay | Admitting: Family Medicine

## 2019-12-03 DIAGNOSIS — Z1231 Encounter for screening mammogram for malignant neoplasm of breast: Secondary | ICD-10-CM

## 2019-12-03 NOTE — Telephone Encounter (Signed)
Call patient: Please let her know I did go ahead and refill her birth control but she really needs to get her mammogram scheduled especially being on hormone therapy over the age of 34.  It looks like the last time she had a mammogram was 2 years ago.

## 2019-12-03 NOTE — Telephone Encounter (Signed)
To pcp

## 2019-12-03 NOTE — Telephone Encounter (Signed)
Per provider's request contacted the patient regarding overdue  Mammogram testing. Pt states at her last imaging, she was informed that reading could not be resulted due to very dense breasts. She is okay with having order placed for the screening. Mammo referral pended.

## 2020-05-04 ENCOUNTER — Encounter: Payer: Self-pay | Admitting: Family Medicine

## 2020-05-04 ENCOUNTER — Telehealth (INDEPENDENT_AMBULATORY_CARE_PROVIDER_SITE_OTHER): Payer: BC Managed Care – PPO | Admitting: Family Medicine

## 2020-05-04 DIAGNOSIS — J302 Other seasonal allergic rhinitis: Secondary | ICD-10-CM

## 2020-05-04 DIAGNOSIS — J069 Acute upper respiratory infection, unspecified: Secondary | ICD-10-CM | POA: Diagnosis not present

## 2020-05-04 IMAGING — DX DG CHEST 2V
2 series · 2 of 2 positions shown · non-contrast
Comparison: None.

CLINICAL DATA: Preop for knee replacement.

EXAM:
CHEST - 2 VIEW

[chest pa]
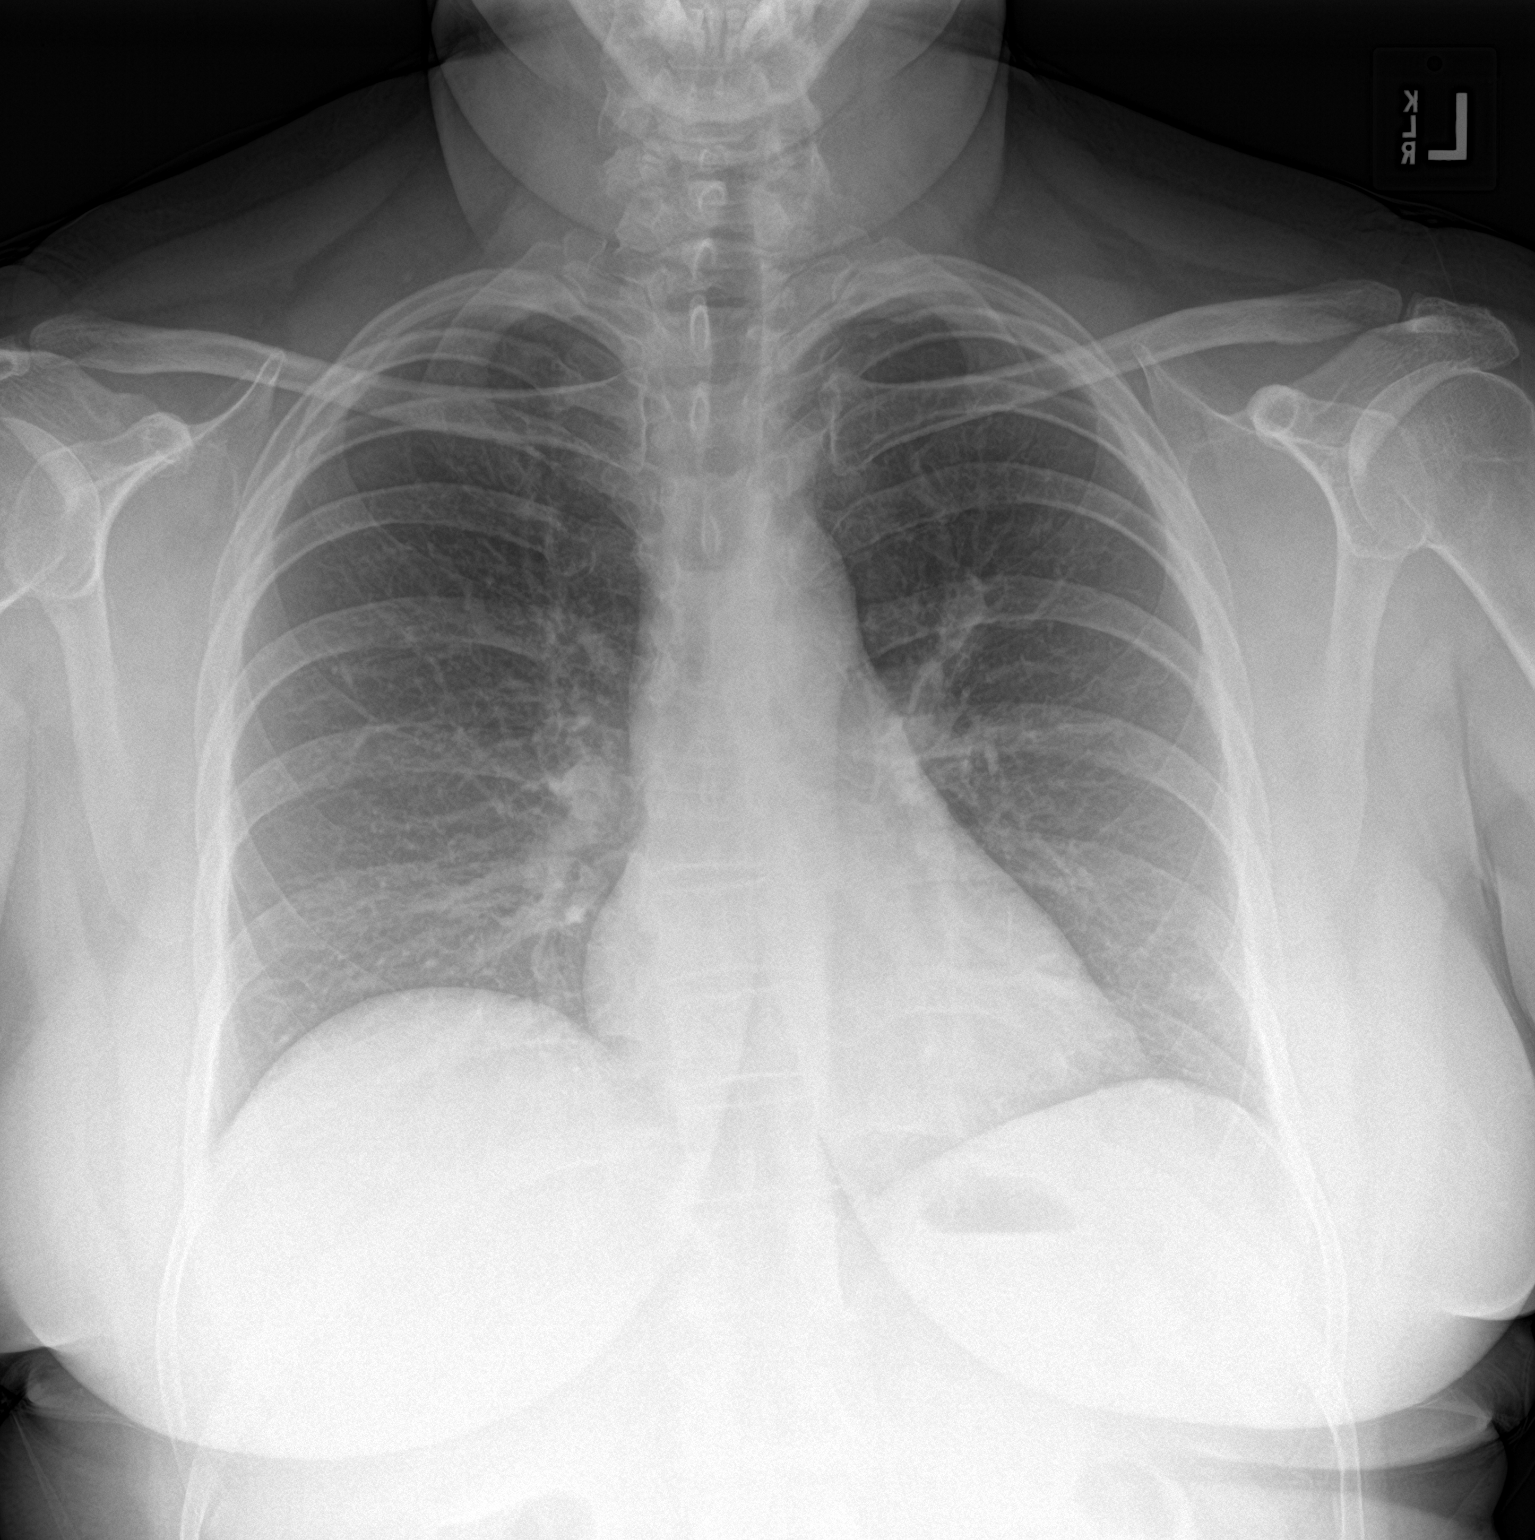

[chest lat]
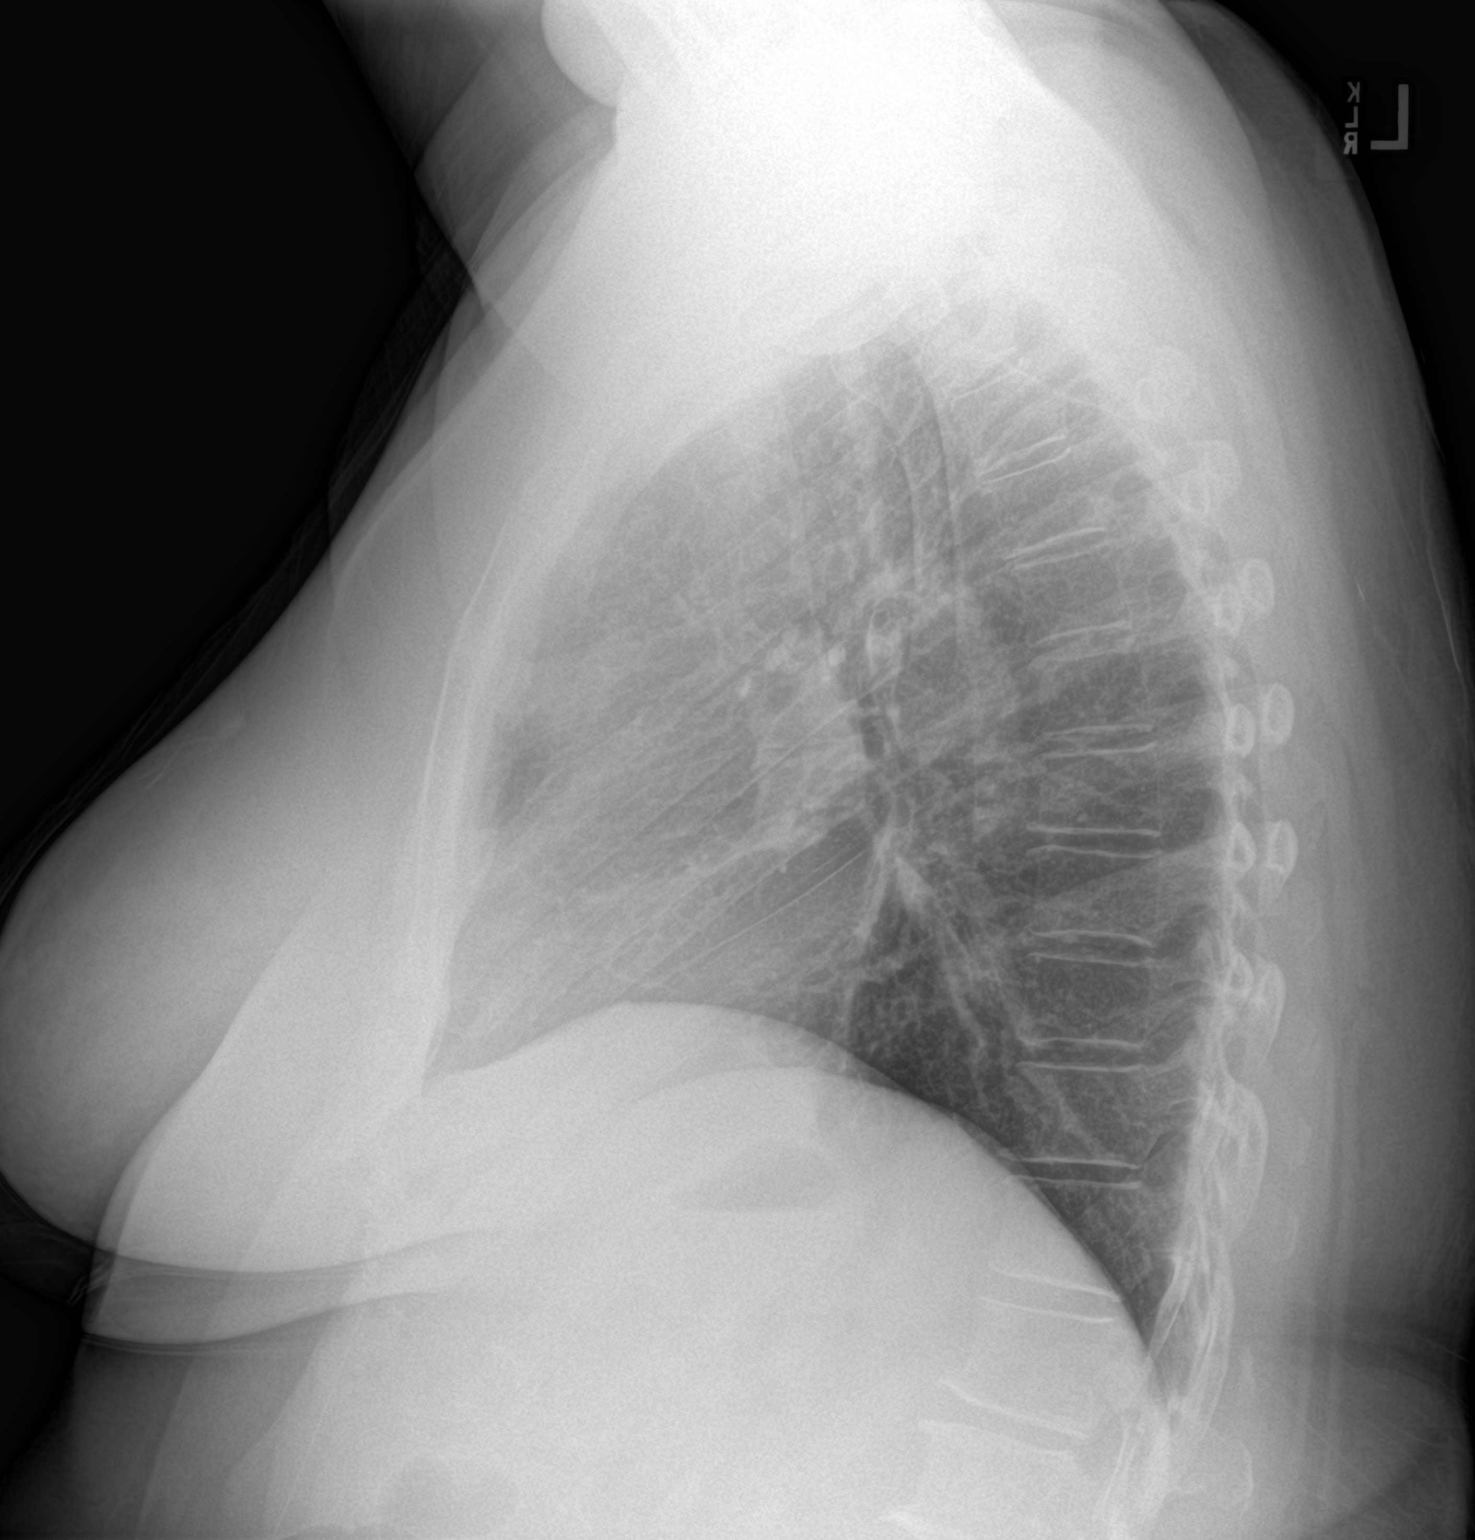

[2 of 2 positions shown; findings below may reference images not displayed]

FINDINGS: The heart size and mediastinal contours are within normal limits.
Both lungs are clear. The visualized skeletal structures are
unremarkable.
IMPRESSION: No active cardiopulmonary disease.

## 2020-05-04 MED ORDER — BENZONATATE 100 MG PO CAPS: 100.0000 mg | ORAL_CAPSULE | Freq: Two times a day (BID) | ORAL | 0 refills | Status: DC | PRN

## 2020-05-04 MED ORDER — LEVOCETIRIZINE DIHYDROCHLORIDE 5 MG PO TABS: 5.0000 mg | ORAL_TABLET | Freq: Every evening | ORAL | 3 refills | Status: DC

## 2020-05-04 NOTE — Patient Instructions (Addendum)
I'm sorry you're not feeling well! Let's try to continue with conservative management since it is still early in the course of illness. If symptoms worsen or fail to improve we can consider other options. Recommend: Mucinex, Flonase, humidifier, saline nose spray, stay hydrated and get some rest! If needed you can also use some tylenol or ibuprofen alternating between the two. I send in prescriptions for Xyzal (allergy medicine) and Tessalon (cough medicine) - try to be sure you are coughing and deep breathing during the day to keep your lungs clear! Let us know if you aren't feeling any better over the next couple days.

## 2020-05-04 NOTE — Progress Notes (Signed)
Virtual Video Visit via MyChart Note  I connected with  Briana Parks on 05/04/20 at  4:00 PM EST by the video enabled telemedicine application for , MyChart, and verified that I am speaking with the correct person using two identifiers.   I introduced myself as a Publishing rights manager with the practice. We discussed the limitations of evaluation and management by telemedicine and the availability of in person appointments. The patient expressed understanding and agreed to proceed.  Participating parties in this visit include: The patient and the nurse practitioner listed. The patient is: At home I am: In the office - [facility]  Subjective:    CC: No chief complaint on file.   HPI: Briana Parks is a 55 y.o. year old female presenting today via MyChart today for sinus congestion.   Starting Sunday night she noticed head congestion, post nasal drainage, green snot, productive cough, mild sore throat from drainage. No fevers. No headaches just feels "stuffy",  no sinus pressure/pain. No recent sick contacts.   Recently had COVID on January 13 - head congestion worse than what she currently has. Improved within 5-6 days. States every year between December - March she has episodes like this, but nothing has really worked (she has seen an allergist an ENT several years ago).  She just started taking Mucinex severe sinus the past couple of days, and states it helps for a few hours and then comes right back   Says she was allergy tested without significant results and was not offered allergy shots.  Denies shortness of breath, chest pain, bloody sputum, fever, chills, loss of taste/smell, ear pain.  Past medical history, Surgical history, Family history not pertinant except as noted below, Social history, Allergies, and medications have been entered into the medical record, reviewed, and corrections made.   Review of Systems:  All review of systems negative except what is  listed in the HPI   Objective:    General:  Speaking clearly in complete sentences. Absent shortness of breath noted.   Alert and oriented x3.   Normal judgment.  Absent acute distress.   Impression and Recommendations:    1. Viral upper respiratory tract infection - benzonatate (TESSALON) 100 MG capsule; Take 1 capsule (100 mg total) by mouth 2 (two) times daily as needed for cough.  Dispense: 20 capsule; Refill: 0 - levocetirizine (XYZAL) 5 MG tablet; Take 1 tablet (5 mg total) by mouth every evening.  Dispense: 90 tablet; Refill: 3  2. Seasonal allergies - levocetirizine (XYZAL) 5 MG tablet; Take 1 tablet (5 mg total) by mouth every evening.  Dispense: 90 tablet; Refill: 3  Patient with less than 5 days of symptoms of upper respiratory infection. Positive for COVID on 04/06/20 per patient, but symptoms resolved prior to current complaints discussed today. Mentioned possibility of secondary infection, but patient also reported this is similar to her annual rhinosinusitis possibly related to allergies. Since it is early in the course and patient is not having severe symptoms, we will start with conservative management. Recommend she continue to use Mucinex, Vicks and start back on the Flonase BID. She did report cough sometimes keeping her up at night so we will try tessalon pearls. Also prescribing Xyzal as antihistamine as patient states Claritin and Zyrtec are no longer very effective for her. Encouraged her to consider seeing the ENT and allergist again since it has been awhile, and she said she would think about it.   Instructed patient to monitor her  symptoms and alerted her of alarm findings to watch out for. If symptoms are worse or not improving once she reaches the 7-10 day mark of symptoms then we can try an antibiotic. Patient is agreeable to plan.    Follow-up if symptoms worsen or fail to improve.    I discussed the assessment and treatment plan with the patient. The  patient was provided an opportunity to ask questions and all were answered. The patient agreed with the plan and demonstrated an understanding of the instructions.   The patient was advised to call back or seek an in-person evaluation if the symptoms worsen or if the condition fails to improve as anticipated.  I provided 20 minutes of non-face-to-face interaction with this MYCHART visit including intake, same-day documentation, and chart review.   Clayborne Dana, NP

## 2020-11-12 ENCOUNTER — Other Ambulatory Visit: Payer: Self-pay | Admitting: Family Medicine

## 2020-11-22 ENCOUNTER — Encounter: Payer: Self-pay | Admitting: Family Medicine

## 2020-11-28 ENCOUNTER — Encounter: Payer: Self-pay | Admitting: Family Medicine

## 2020-11-28 ENCOUNTER — Ambulatory Visit (INDEPENDENT_AMBULATORY_CARE_PROVIDER_SITE_OTHER): Payer: BC Managed Care – PPO | Admitting: Family Medicine

## 2020-11-28 ENCOUNTER — Other Ambulatory Visit: Payer: Self-pay

## 2020-11-28 VITALS — BP 135/63 | HR 73 | Ht 66.0 in | Wt 219.1 lb

## 2020-11-28 DIAGNOSIS — Z1231 Encounter for screening mammogram for malignant neoplasm of breast: Secondary | ICD-10-CM | POA: Diagnosis not present

## 2020-11-28 DIAGNOSIS — Z1211 Encounter for screening for malignant neoplasm of colon: Secondary | ICD-10-CM | POA: Diagnosis not present

## 2020-11-28 DIAGNOSIS — Z0000 Encounter for general adult medical examination without abnormal findings: Secondary | ICD-10-CM

## 2020-11-28 DIAGNOSIS — Z23 Encounter for immunization: Secondary | ICD-10-CM | POA: Diagnosis not present

## 2020-11-28 DIAGNOSIS — R519 Headache, unspecified: Secondary | ICD-10-CM

## 2020-11-28 MED ORDER — PROPRANOLOL HCL 20 MG PO TABS: 20.0000 mg | ORAL_TABLET | Freq: Two times a day (BID) | ORAL | 3 refills | Status: DC

## 2020-11-28 NOTE — Assessment & Plan Note (Signed)
Patient requested that I take over her propranolol she says neurology has released her since she has been stable on her current regimen.  I will go ahead and send over new prescription to the pharmacy we can always consult again if needed.

## 2020-11-28 NOTE — Addendum Note (Signed)
Addended by: Nani Gasser D on: 11/28/2020 06:18 PM   Modules accepted: Orders

## 2020-11-28 NOTE — Progress Notes (Addendum)
Subjective:     Briana Parks is a 55 y.o. female and is here for a comprehensive physical exam. The patient reports no problems.  Social History   Socioeconomic History   Marital status: Married    Spouse name: Fayrene Fearing   Number of children: 2   Years of education: college   Highest education level: Not on file  Occupational History    Employer: [employer]    Comment: [employer]  Tobacco Use   Smoking status: Never   Smokeless tobacco: Never  Vaping Use   Vaping Use: Never used  Substance and Sexual Activity   Alcohol use: Yes    Comment: mixed drinks   Drug use: No   Sexual activity: Not on file    Comment: cake decorator, 2 yrs college, married, 2 children, 4 sodas daily, no regular exercise  Other Topics Concern   Not on file  Social History Narrative   Patient lives at home with her husband Fayrene Fearing)   Education two years of college education.   Patient works for [employer]. [employer]   Right handed.   Caffeine three cups of coke daily.         Social Determinants of Health   Financial Resource Strain: Not on file  Food Insecurity: Not on file  Transportation Needs: Not on file  Physical Activity: Not on file  Stress: Not on file  Social Connections: Not on file  Intimate Partner Violence: Not on file   Health Maintenance  Topic Date Due   MAMMOGRAM  11/01/2019   INFLUENZA VACCINE  06/22/2021 (Originally 10/23/2020)   COLONOSCOPY (Pts 45-24yrs Insurance coverage will need to be confirmed)  11/28/2021 (Originally 05/18/2012)   Hepatitis C Screening  11/28/2021 (Originally [birth year]18/1985)   COVID-19 Vaccine (1) 12/14/2021 (Originally 05/18/1970)   Zoster Vaccines- Shingrix (2 of 2) 01/23/2021   PAP SMEAR-Modifier  10/03/2024   HIV Screening  Completed   Pneumococcal Vaccine 50-57 Years old  Aged Out   HPV VACCINES  Aged Out    The following portions of the patient's history were reviewed and updated as appropriate: allergies, current medications, past family  history, past medical history, past social history, past surgical history, and problem list.  Review of Systems Pertinent items noted in HPI and remainder of comprehensive ROS otherwise negative.   Objective:    BP 135/63   Pulse 73   Ht 5\' 6"  (1.676 m)   Wt 219 lb 1.9 oz (99.4 kg)   SpO2 97%   BMI 35.37 kg/m  General appearance: alert, cooperative, and appears stated age Head: Normocephalic, without obvious abnormality, atraumatic Eyes:  conj clear, EOMi, PEERLA Ears: normal TM's and external ear canals both ears Nose: Nares normal. Septum midline. Mucosa normal. No drainage or sinus tenderness. Throat: lips, mucosa, and tongue normal; teeth and gums normal Neck: no adenopathy, no carotid bruit, no JVD, supple, symmetrical, trachea midline, and thyroid not enlarged, symmetric, no tenderness/mass/nodules Back: symmetric, no curvature. ROM normal. No CVA tenderness. Lungs: clear to auscultation bilaterally Heart: regular rate and rhythm, S1, S2 normal, no murmur, click, rub or gallop Abdomen: soft, non-tender; bowel sounds normal; no masses,  no organomegaly Extremities: extremities normal, atraumatic, no cyanosis or edema Pulses: 2+ and symmetric Skin: Skin color, texture, turgor normal. No rashes or lesions Lymph nodes: Cervical, supraclavicular, and axillary nodes normal. Neurologic: Alert and oriented X 3, normal strength and tone. Normal symmetric reflexes. Normal coordination and gait    Assessment:    Healthy female  exam.      Plan:     See After Visit Summary for Counseling Recommendations  Keep up a regular exercise program and make sure you are eating a healthy diet Try to eat 4 servings of dairy a day, or if you are lactose intolerant take a calcium with vitamin D daily.  Your vaccines are up to date.  Pap is up to date.  Cologuard ordered.   Shingles vaccine given   Offered egg free flu. Declined but will think about it.   Mammo ordered.    Nonintractable  episodic headache Patient requested that I take over her propranolol she says neurology has released her since she has been stable on her current regimen.  I will go ahead and send over new prescription to the pharmacy we can always consult again if needed.  Orders Placed This Encounter  Procedures   MM 3D SCREEN BREAST BILATERAL    Standing Status:   Future    Standing Expiration Date:   11/28/2021    Order Specific Question:   Reason for Exam (SYMPTOM  OR DIAGNOSIS REQUIRED)    Answer:   breast cancer screening    Order Specific Question:   Is the patient pregnant?    Answer:   No    Order Specific Question:   Preferred imaging location?    Answer:   [facility]   Varicella-zoster vaccine IM (Shingrix)   Comprehensive metabolic panel   Lipid panel   Cologuard   CBC with Differential

## 2021-02-02 LAB — CBC WITH DIFFERENTIAL/PLATELET
Absolute Monocytes: 432 cells/uL (ref 200–950)
Basophils Absolute: 50 cells/uL (ref 0–200)
Basophils Relative: 0.6 %
Eosinophils Absolute: 50 cells/uL (ref 15–500)
Eosinophils Relative: 0.6 %
HCT: 40.5 % (ref 35.0–45.0)
Hemoglobin: 13.8 g/dL (ref 11.7–15.5)
Lymphs Abs: 3436 cells/uL (ref 850–3900)
MCH: 31 pg (ref 27.0–33.0)
MCHC: 34.1 g/dL (ref 32.0–36.0)
MCV: 91 fL (ref 80.0–100.0)
MPV: 10.1 fL (ref 7.5–12.5)
Monocytes Relative: 5.2 %
Neutro Abs: 4333 cells/uL (ref 1500–7800)
Neutrophils Relative %: 52.2 %
Platelets: 310 10*3/uL (ref 140–400)
RBC: 4.45 10*6/uL (ref 3.80–5.10)
RDW: 11.8 % (ref 11.0–15.0)
Total Lymphocyte: 41.4 %
WBC: 8.3 10*3/uL (ref 3.8–10.8)

## 2021-02-03 LAB — LIPID PANEL
Cholesterol: 177 mg/dL (ref <200)
HDL: 42 mg/dL — ABNORMAL LOW (ref >=50)
LDL Cholesterol (Calc): 112 mg/dL (calc) — ABNORMAL HIGH
Non-HDL Cholesterol (Calc): 135 mg/dL (calc) — ABNORMAL HIGH (ref <130)
Total CHOL/HDL Ratio: 4.2 (calc) (ref <5.0)
Triglycerides: 121 mg/dL (ref <150)

## 2021-02-03 LAB — COMPREHENSIVE METABOLIC PANEL
AG Ratio: 1.8 (calc) (ref 1.0–2.5)
ALT: 13 U/L (ref 6–29)
AST: 13 U/L (ref 10–35)
Albumin: 4.1 g/dL (ref 3.6–5.1)
Alkaline phosphatase (APISO): 59 U/L (ref 37–153)
BUN: 13 mg/dL (ref 7–25)
CO2: 22 mmol/L (ref 20–32)
Calcium: 8.7 mg/dL (ref 8.6–10.4)
Chloride: 105 mmol/L (ref 98–110)
Creat: 0.58 mg/dL (ref 0.50–1.03)
Globulin: 2.3 g/dL (calc) (ref 1.9–3.7)
Glucose, Bld: 99 mg/dL (ref 65–99)
Potassium: 4.1 mmol/L (ref 3.5–5.3)
Sodium: 137 mmol/L (ref 135–146)
Total Bilirubin: 0.3 mg/dL (ref 0.2–1.2)
Total Protein: 6.4 g/dL (ref 6.1–8.1)

## 2021-02-05 NOTE — Progress Notes (Signed)
Hi Briana Parks, your blood count looks great no sign of anemia.  The [place] panel including liver and kidney function also was normal.  Your cholesterol is just mildly elevated.  Just continue work on [employer] and regular exercise.

## 2021-05-03 ENCOUNTER — Other Ambulatory Visit: Payer: Self-pay | Admitting: Family Medicine

## 2021-05-25 ENCOUNTER — Encounter: Payer: Self-pay | Admitting: Family Medicine

## 2021-05-25 ENCOUNTER — Ambulatory Visit: Payer: BC Managed Care – PPO | Admitting: Family Medicine

## 2021-05-25 ENCOUNTER — Other Ambulatory Visit: Payer: Self-pay

## 2021-05-25 VITALS — BP 139/53 | HR 80 | Temp 98.2°F | Ht 66.0 in | Wt 207.0 lb

## 2021-05-25 DIAGNOSIS — J40 Bronchitis, not specified as acute or chronic: Secondary | ICD-10-CM | POA: Diagnosis not present

## 2021-05-25 DIAGNOSIS — I77810 Thoracic aortic ectasia: Secondary | ICD-10-CM | POA: Insufficient documentation

## 2021-05-25 DIAGNOSIS — J329 Chronic sinusitis, unspecified: Secondary | ICD-10-CM | POA: Diagnosis not present

## 2021-05-25 MED ORDER — BENZONATATE 200 MG PO CAPS: 200.0000 mg | ORAL_CAPSULE | Freq: Two times a day (BID) | ORAL | 0 refills | Status: DC | PRN

## 2021-05-25 MED ORDER — AZITHROMYCIN 250 MG PO TABS: ORAL_TABLET | ORAL | 0 refills | Status: AC

## 2021-05-25 MED ORDER — PREDNISONE 20 MG PO TABS: 40.0000 mg | ORAL_TABLET | Freq: Every day | ORAL | 0 refills | Status: DC

## 2021-05-25 NOTE — Progress Notes (Signed)
? ?Acute Office Visit ? ?Subjective:  ? ? Patient ID: Briana Parks, female    DOB: 11/28/1965, 56 y.o.   MRN: [id number] ? ?Chief Complaint  ?Patient presents with  ? Acute Visit  ? ? ?HPI ?Patient is in today for Cough and congestion for greater than 2 weeks. She is SOB. No fever.  ST initially.   ? ?Past Medical History:  ?Diagnosis Date  ? ALLERGIC RHINITIS CAUSE UNSPECIFIED 08/03/2008  ? Qualifier: Diagnosis of  By: Thomos Lemons    ? Arachnoid cyst   ? Deviated septum   ? large turbinates  ? Gestational diabetes   ? Headache 09/30/2013  ? Inguinal hernia   ? bilateral at birth  ? Kidney infection   ? Low blood pressure   ? Obstructive sleep apnea 7cm water pressure CPAP 10/24/2010  ? Moderate. Sleep study was performed at [facility] neurological clinic by Dr. Chestine Spore Pi?on on 10/02/2010. AHI was 19.8 with desaturations to 79%. Frequent severe snoring. Moderate wedge works associated with respiratory events. Difficulty initiating and maintaining sleep with no slow wave sleep. She had severe OSA and REM.   ? Osteoarthritis of left knee 10/26/2013  ? Plantar fasciitis   ? Pneumonia   ? history of  ? PONV (postoperative nausea and vomiting)   ? hard to wake up after anesthesia, did not get PONV after 2017 surgery at [facility]. notes care everywhere  ? Recurrent UTI   ? Rosacea 09/13/2010  ? Scoliosis   ? Tinnitus   ? Vertigo   ? ? ?Past Surgical History:  ?Procedure Laterality Date  ? bone spur removal Right 02/17/2012  ? foot  ? bone spur removal Left 08/2015  ? HERNIA REPAIR Bilateral   ? KNEE ARTHROSCOPY Left 03/16/14  ? [facility]   ? TONSILLECTOMY AND ADENOIDECTOMY    ? TOTAL KNEE ARTHROPLASTY Left 06/11/2019  ? Procedure: TOTAL KNEE ARTHROPLASTY;  Surgeon: Jodi Geralds, MD;  Location: [facility];  Service: Orthopedics;  Laterality: Left;  ? TUBAL LIGATION    ? ? ?Family History  ?Problem Relation Age of Onset  ? Arthritis Mother   ? Fibromyalgia Mother   ? Cancer Mother   ?     liver small  intestine carcinoid tumor  ? ? ?Social History  ? ?Socioeconomic History  ? Marital status: Married  ?  Spouse name: Fayrene Fearing  ? Number of children: 2  ? Years of education: college  ? Highest education level: Not on file  ?Occupational History  ?  Employer: [employer]  ?  Comment: [employer]  ?Tobacco Use  ? Smoking status: Never  ? Smokeless tobacco: Never  ?Vaping Use  ? Vaping Use: Never used  ?Substance and Sexual Activity  ? Alcohol use: Yes  ?  Comment: mixed drinks  ? Drug use: No  ? Sexual activity: Not on file  ?  Comment: Equities trader, 2 yrs college, married, 2 children, 4 sodas daily, no regular exercise  ?Other Topics Concern  ? Not on file  ?Social History Narrative  ? Patient lives at home with her husband Fayrene Fearing)  ? Education two years of college education.  ? Patient works for [employer]. [employer]  ? Right handed.  ? Caffeine three cups of coke daily.  ?   ?   ? ?Social Determinants of Health  ? ?Financial Resource Strain: Not on file  ?Food Insecurity: Not on file  ?Transportation Needs: Not on file  ?Physical Activity: Not  on file  ?Stress: Not on file  ?Social Connections: Not on file  ?Intimate Partner Violence: Not on file  ? ? ?Outpatient Medications Prior to Visit  ?Medication Sig Dispense Refill  ? AMBULATORY NON FORMULARY MEDICATION Medication Name: 7 cm water pressure CPAP, heated/humidified machine.   ?Dx : Severe OSA. 1 Units 0  ? levocetirizine (XYZAL) 5 MG tablet Take 1 tablet (5 mg total) by mouth every evening. 90 tablet 3  ? meloxicam (MOBIC) 15 MG tablet Take 1 tablet (15 mg total) by mouth daily as needed for pain. 30 tablet 3  ? Multiple Vitamin (MULTIVITAMIN WITH MINERALS) TABS tablet Take 1 tablet by mouth daily.    ? NORTREL 7/7/7 0.5/0.75/1-35 MG-MCG tablet TAKE 1 TABLET BY MOUTH EVERY DAY 84 tablet 1  ? nystatin (MYCOSTATIN/NYSTOP) powder Apply topically 4 (four) times daily. (Patient taking differently: Apply 1 application topically 4 (four) times daily as needed (skin  irritation/rash).) 60 g 1  ? nystatin cream (MYCOSTATIN) Apply 1 application topically 2 (two) times daily. (Patient taking differently: Apply 1 application topically 2 (two) times daily as needed (skin irritation/rash).) 45 g 1  ? propranolol (INDERAL) 20 MG tablet Take 1 tablet (20 mg total) by mouth 2 (two) times daily. 180 tablet 3  ? ?No facility-administered medications prior to visit.  ? ? ?Allergies  ?Allergen Reactions  ? Eggs Or Egg-Derived Products   ? Sulfamethoxazole-Trimethoprim Hives  ? Tdap [Tetanus-Diphth-Acell Pertussis] Other (See Comments)  ?  Skin reaction with lymph node sweeling  ? ? ?Review of Systems ? ?   ?Objective:  ?  ?Physical Exam ?Constitutional:   ?   Appearance: She is well-developed.  ?HENT:  ?   Head: Normocephalic and atraumatic.  ?   Right Ear: External ear normal.  ?   Left Ear: External ear normal.  ?   Nose: Nose normal.  ?Eyes:  ?   Conjunctiva/sclera: Conjunctivae normal.  ?   Pupils: Pupils are equal, round, and reactive to light.  ?Neck:  ?   Thyroid: No thyromegaly.  ?Cardiovascular:  ?   Rate and Rhythm: Normal rate and regular rhythm.  ?   Heart sounds: Normal heart sounds.  ?Pulmonary:  ?   Effort: Pulmonary effort is normal.  ?   Breath sounds: Normal breath sounds. No wheezing.  ?Musculoskeletal:  ?   Cervical back: Neck supple.  ?Lymphadenopathy:  ?   Cervical: No cervical adenopathy.  ?Skin: ?   General: Skin is warm and dry.  ?Neurological:  ?   Mental Status: She is alert and oriented to person, place, and time.  ? ? ?BP (!) 139/53 (BP Location: Left Arm, Patient Position: Sitting, Cuff Size: Large)   Pulse 80   Temp 98.2 ?F (36.8 ?C)   Ht 5\' 6"  (1.676 m)   Wt 207 lb (93.9 kg)   SpO2 98%   BMI 33.41 kg/m?  ?Wt Readings from Last 3 Encounters:  ?05/25/21 207 lb (93.9 kg)  ?11/28/20 219 lb 1.9 oz (99.4 kg)  ?10/11/19 221 lb (100.2 kg)  ? ? ?Health Maintenance Due  ?Topic Date Due  ? MAMMOGRAM  11/01/2019  ? Zoster Vaccines- Shingrix (2 of 2) 01/23/2021   ? ? ?There are no preventive care reminders to display for this patient. ? ? ?Lab Results  ?Component Value Date  ? TSH 1.824 09/24/2011  ? ?Lab Results  ?Component Value Date  ? WBC 8.3 02/02/2021  ? HGB 13.8 02/02/2021  ? HCT 40.5 02/02/2021  ?  MCV 91.0 02/02/2021  ? PLT 310 02/02/2021  ? ?Lab Results  ?Component Value Date  ? NA 137 02/02/2021  ? K 4.1 02/02/2021  ? CO2 22 02/02/2021  ? GLUCOSE 99 02/02/2021  ? BUN 13 02/02/2021  ? CREATININE 0.58 02/02/2021  ? BILITOT 0.3 02/02/2021  ? ALKPHOS 55 06/08/2019  ? AST 13 02/02/2021  ? ALT 13 02/02/2021  ? PROT 6.4 02/02/2021  ? ALBUMIN 3.8 06/08/2019  ? CALCIUM 8.7 02/02/2021  ? ANIONGAP 9 06/08/2019  ? ?Lab Results  ?Component Value Date  ? CHOL 177 02/02/2021  ? ?Lab Results  ?Component Value Date  ? HDL 42 (L) 02/02/2021  ? ?Lab Results  ?Component Value Date  ? LDLCALC 112 (H) 02/02/2021  ? ?Lab Results  ?Component Value Date  ? TRIG 121 02/02/2021  ? ?Lab Results  ?Component Value Date  ? CHOLHDL 4.2 02/02/2021  ? ?No results found for: HGBA1C ? ?   ?Assessment & Plan:  ? ?Problem List Items Addressed This Visit   ? ?  ? Cardiovascular and Mediastinum  ? RESOLVED: Ascending aorta dilatation (HCC)  ? ?Other Visit Diagnoses   ? ? Sinobronchitis    -  Primary  ? Relevant Medications  ? predniSONE (DELTASONE) 20 MG tablet  ? azithromycin (ZITHROMAX) 250 MG tablet  ? benzonatate (TESSALON) 200 MG capsule  ? ?  ? ?Sinobronchitis-we will treat with azithromycin and prednisone.  Call if not better after the weekend.  Do expect about a week to significantly improved.  Work note provided.  Okay to continue over-the-counter medications as well. ? ?Meds ordered this encounter  ?Medications  ? predniSONE (DELTASONE) 20 MG tablet  ?  Sig: Take 2 tablets (40 mg total) by mouth daily with breakfast.  ?  Dispense:  10 tablet  ?  Refill:  0  ? azithromycin (ZITHROMAX) 250 MG tablet  ?  Sig: 2 Ttabs PO on Day 1, then one a day x 4 days.  ?  Dispense:  6 tablet  ?  Refill:  0  ?  benzonatate (TESSALON) 200 MG capsule  ?  Sig: Take 1 capsule (200 mg total) by mouth 2 (two) times daily as needed for cough.  ?  Dispense:  20 capsule  ?  Refill:  0  ? ? ? ?Nani Gasser, MD ? ?

## 2021-05-29 ENCOUNTER — Encounter: Payer: Self-pay | Admitting: Family Medicine

## 2021-05-29 MED ORDER — AMOXICILLIN-POT CLAVULANATE 875-125 MG PO TABS: 1.0000 | ORAL_TABLET | Freq: Two times a day (BID) | ORAL | 0 refills | Status: DC

## 2021-05-29 NOTE — Telephone Encounter (Signed)
Meds ordered this encounter  ?Medications  ? amoxicillin-clavulanate (AUGMENTIN) 875-125 MG tablet  ?  Sig: Take 1 tablet by mouth 2 (two) times daily.  ?  Dispense:  14 tablet  ?  Refill:  0  ? ? ?

## 2021-10-24 ENCOUNTER — Other Ambulatory Visit: Payer: Self-pay | Admitting: Family Medicine

## 2021-12-03 ENCOUNTER — Other Ambulatory Visit: Payer: Self-pay | Admitting: Family Medicine

## 2021-12-03 DIAGNOSIS — R519 Headache, unspecified: Secondary | ICD-10-CM

## 2022-03-28 ENCOUNTER — Encounter: Payer: Self-pay | Admitting: Family Medicine

## 2022-03-28 ENCOUNTER — Ambulatory Visit: Payer: BC Managed Care – PPO | Admitting: Family Medicine

## 2022-03-28 VITALS — BP 135/80 | HR 88 | Temp 98.7°F | Ht 66.0 in | Wt 215.4 lb

## 2022-03-28 DIAGNOSIS — H66001 Acute suppurative otitis media without spontaneous rupture of ear drum, right ear: Secondary | ICD-10-CM | POA: Diagnosis not present

## 2022-03-28 DIAGNOSIS — R051 Acute cough: Secondary | ICD-10-CM | POA: Insufficient documentation

## 2022-03-28 MED ORDER — BENZONATATE 100 MG PO CAPS: 100.0000 mg | ORAL_CAPSULE | Freq: Two times a day (BID) | ORAL | 0 refills | Status: DC | PRN

## 2022-03-28 MED ORDER — AMOXICILLIN-POT CLAVULANATE 875-125 MG PO TABS: 1.0000 | ORAL_TABLET | Freq: Two times a day (BID) | ORAL | 0 refills | Status: DC

## 2022-03-28 NOTE — Progress Notes (Signed)
Acute Office Visit  Subjective:     Patient ID: Briana Parks, female    DOB: 11/26/1965, 57 y.o.   MRN: [id number]  Chief Complaint  Patient presents with   Sinusitis    Symptoms started Tuesday. She has taken Alka seltzer cold and flu.    Nasal Congestion   Cough    HPI Presents today for an acute visit with complaint of major head, nasal congestion, cough, and right ear pain, feels like mucus is lodged in throat. Having chest discomfort worse with deep breathing and coughing.  Symptoms have been present for 2 days, got worse yesterday.  Associated symptoms include: chills Pertinent negatives: no fever, no shortness of breath Pain severity: 5/10 headache Treatments tried include : alka seltzer cold and flu max Treatment effective : not effective Sick contacts : no  Review of Systems  Constitutional:  Positive for chills. Negative for fever.  HENT:  Positive for congestion, ear pain and sinus pain. Negative for ear discharge.   Respiratory:  Positive for cough. Negative for shortness of breath.   Cardiovascular:  Negative for chest pain.        Objective:    BP 135/80   Pulse 88   Temp 98.7 F (37.1 C) (Oral)   Ht 5\' 6"  (1.676 m)   Wt 215 lb 6.4 oz (97.7 kg)   SpO2 98%   BMI 34.77 kg/m    Physical Exam Vitals and nursing note reviewed.  Constitutional:      General: She is not in acute distress.    Appearance: She is well-developed. She is ill-appearing.  HENT:     Right Ear: Tympanic membrane is erythematous.     Left Ear: Tympanic membrane normal.     Nose:     Right Sinus: Frontal sinus tenderness present. No maxillary sinus tenderness.     Left Sinus: Frontal sinus tenderness present. No maxillary sinus tenderness.     Mouth/Throat:     Pharynx: Uvula midline. No pharyngeal swelling, oropharyngeal exudate, posterior oropharyngeal erythema or uvula swelling.  Cardiovascular:     Rate and Rhythm: Regular rhythm.     Heart sounds: Normal heart  sounds.  Pulmonary:     Effort: Pulmonary effort is normal.     Breath sounds: Normal breath sounds.  Skin:    General: Skin is warm and dry.  Neurological:     General: No focal deficit present.     Mental Status: She is alert. Mental status is at baseline.     No results found for any visits on 03/28/22.      Assessment & Plan:   Problem List Items Addressed This Visit     Acute cough   Relevant Medications   benzonatate (TESSALON) 100 MG capsule   Non-recurrent acute suppurative otitis media of right ear without spontaneous rupture of tympanic membrane - Primary   Relevant Medications   amoxicillin-clavulanate (AUGMENTIN) 875-125 MG tablet    Meds ordered this encounter  Medications   amoxicillin-clavulanate (AUGMENTIN) 875-125 MG tablet    Sig: Take 1 tablet by mouth 2 (two) times daily.    Dispense:  20 tablet    Refill:  0    Order Specific Question:   Supervising Provider    Answer:   Leeanne Rio [[id number]]   benzonatate (TESSALON) 100 MG capsule    Sig: Take 1 capsule (100 mg total) by mouth 2 (two) times daily as needed for cough.    Dispense:  30 capsule  Refill:  0    Order Specific Question:   Supervising Provider    Answer:   Leeanne Rio [[id number]]  Right TM with erythema, chills, head/chest congestion, and cough. Lungs clear, oxygen saturation 98%. No shortness of breath, no fever.  Augmentin 875-125 mg BID x 10 days for OM.  Sinus rinses for congestion.  Flonase daily Tylenol or ibuprofen for headache.  Fluids and rest. Benzonatate 100 mg BID as needed for cough.  Agrees with plan of care discussed today. Questions answered. She will follow-up if symptoms do not improve.  Work note provided.   Return if symptoms worsen or fail to improve.  Chalmers Guest, FNP

## 2022-03-28 NOTE — Patient Instructions (Signed)
Cough can linger. Rest, fluids, honey if not diabetic can help. Elevating to sleep may also help.   Take over the counter pain medication as needed. Acetaminophen and ibuprofen for fever and body aches. Mucinex and Robitussin for cough, if you have high blood pressure, take Coricidin for cough. Honey is also effective for cough, avoid if diabetic. Flonase or saline nasal spray for nasal congestion.    Read and follow instructions on the label and make sure not to combine other medications that may have same ingredients in it. It is important to not take too much.    Drink plenty of caffeine-free fluids. (If you have heart or kidney problems, follow the instructions of your specialist regarding amounts). Adequate fluids will help you to avoid dehydration.   If you are hungry, eat a bland diet, such as the BRAT diet (bananas, rice, applesauce, toast). Diet as tolerated if appetite is normal.   Get lots of rest.  Let us know if you are not improving or getting worse.

## 2022-04-03 ENCOUNTER — Telehealth: Payer: Self-pay | Admitting: Family Medicine

## 2022-04-03 DIAGNOSIS — Z1231 Encounter for screening mammogram for malignant neoplasm of breast: Secondary | ICD-10-CM

## 2022-04-03 NOTE — Telephone Encounter (Signed)
Please call pt and see where gets mammo. She is overdue. Also see if has had flu shot for this year.

## 2022-04-04 NOTE — Telephone Encounter (Signed)
Attempted call to patient. Left voice mail message requesting a return call.  

## 2022-04-10 NOTE — Telephone Encounter (Signed)
Spoke w/pt she hasn't had her mammogram done. Her last mammo was done downstairs and due to her dense breast she usually has to have this repeated.   Will place order for the breast center.   Pt also asked about her flu shot she states that she is allergic to the regular flu shot and has to have the egg free. I informed her that we have the egg free flu shot and that I can transfer her to the scheduler to make an appointment with the nurse to have this done. She stated that she will need to call back to do this.   Pt also reminded of her colonoscopy being due.

## 2022-04-10 NOTE — Addendum Note (Signed)
Addended by: Teddy Spike on: 04/10/2022 03:29 PM   Modules accepted: Orders

## 2022-04-13 ENCOUNTER — Other Ambulatory Visit: Payer: Self-pay | Admitting: Family Medicine

## 2022-09-23 ENCOUNTER — Other Ambulatory Visit: Payer: Self-pay | Admitting: *Deleted

## 2022-09-23 ENCOUNTER — Ambulatory Visit (INDEPENDENT_AMBULATORY_CARE_PROVIDER_SITE_OTHER): Payer: BC Managed Care – PPO | Admitting: Sports Medicine

## 2022-09-23 ENCOUNTER — Ambulatory Visit (INDEPENDENT_AMBULATORY_CARE_PROVIDER_SITE_OTHER): Payer: BC Managed Care – PPO

## 2022-09-23 DIAGNOSIS — M25571 Pain in right ankle and joints of right foot: Secondary | ICD-10-CM | POA: Diagnosis not present

## 2022-09-23 DIAGNOSIS — Z0000 Encounter for general adult medical examination without abnormal findings: Secondary | ICD-10-CM

## 2022-09-23 MED ORDER — MELOXICAM 15 MG PO TABS: ORAL_TABLET | ORAL | 3 refills | Status: DC

## 2022-09-23 NOTE — Progress Notes (Signed)
    Procedures performed today:    None.  Independent interpretation of notes and tests performed by another provider:   None.  Brief History, Exam, Impression, and Recommendations:    Acute right ankle pain Pleasant 57 year old female, was cleaning house this weekend, started having pain on Friday, severe, anterior to the medial malleolus. No trauma or injuries. On exam she has mild swelling, she has good strength, good range of motion, she has severe discrete tenderness anteriorly between the talus and medial malleolus as well as distal to the tip of the medial malleolus, no pain behind the medial malleolus. Suspect more of a medial gutter process of the tibiotalar joint. Adding x-rays, she has a cam boot at home that she will wear, adding meloxicam, home PT. Return to see me in about 2 weeks, she does have a cruise coming up on the 19th, if insufficient improvement by the follow-up we will do a tibiotalar joint injection so that she can enjoy her cruise.    ____________________________________________ Ihor Austin. Benjamin Stain, M.D., ABFM., CAQSM., AME. Primary Care and Sports Medicine [facility]  Adjunct Professor of Family Medicine  [employer]  Restaurant manager, fast food

## 2022-09-23 NOTE — Assessment & Plan Note (Signed)
Pleasant 57 year old female, was cleaning house this weekend, started having pain on Friday, severe, anterior to the medial malleolus. No trauma or injuries. On exam she has mild swelling, she has good strength, good range of motion, she has severe discrete tenderness anteriorly between the talus and medial malleolus as well as distal to the tip of the medial malleolus, no pain behind the medial malleolus. Suspect more of a medial gutter process of the tibiotalar joint. Adding x-rays, she has a cam boot at home that she will wear, adding meloxicam, home PT. Return to see me in about 2 weeks, she does have a cruise coming up on the 19th, if insufficient improvement by the follow-up we will do a tibiotalar joint injection so that she can enjoy her cruise.

## 2022-09-24 ENCOUNTER — Other Ambulatory Visit: Payer: Self-pay | Admitting: Family Medicine

## 2022-09-26 LAB — COMPLETE METABOLIC PANEL WITH GFR
AG Ratio: 1.4 (calc) (ref 1.0–2.5)
ALT: 17 U/L (ref 6–29)
AST: 16 U/L (ref 10–35)
Albumin: 3.9 g/dL (ref 3.6–5.1)
Alkaline phosphatase (APISO): 61 U/L (ref 37–153)
BUN: 15 mg/dL (ref 7–25)
CO2: 24 mmol/L (ref 20–32)
Calcium: 9 mg/dL (ref 8.6–10.4)
Chloride: 106 mmol/L (ref 98–110)
Creat: 0.57 mg/dL (ref 0.50–1.03)
Globulin: 2.8 g/dL (calc) (ref 1.9–3.7)
Glucose, Bld: 91 mg/dL (ref 65–99)
Potassium: 4.6 mmol/L (ref 3.5–5.3)
Sodium: 138 mmol/L (ref 135–146)
Total Bilirubin: 0.3 mg/dL (ref 0.2–1.2)
Total Protein: 6.7 g/dL (ref 6.1–8.1)
eGFR: 107 mL/min/{1.73_m2} (ref >=60)

## 2022-09-26 LAB — CBC WITH DIFFERENTIAL/PLATELET
Absolute Monocytes: 418 cells/uL (ref 200–950)
Basophils Absolute: 62 cells/uL (ref 0–200)
Basophils Relative: 0.7 %
Eosinophils Absolute: 107 cells/uL (ref 15–500)
Eosinophils Relative: 1.2 %
HCT: 43.1 % (ref 35.0–45.0)
Hemoglobin: 14.1 g/dL (ref 11.7–15.5)
Lymphs Abs: 3800 cells/uL (ref 850–3900)
MCH: 30.4 pg (ref 27.0–33.0)
MCHC: 32.7 g/dL (ref 32.0–36.0)
MCV: 92.9 fL (ref 80.0–100.0)
MPV: 9.6 fL (ref 7.5–12.5)
Monocytes Relative: 4.7 %
Neutro Abs: 4512 cells/uL (ref 1500–7800)
Neutrophils Relative %: 50.7 %
Platelets: 308 10*3/uL (ref 140–400)
RBC: 4.64 10*6/uL (ref 3.80–5.10)
RDW: 12.5 % (ref 11.0–15.0)
Total Lymphocyte: 42.7 %
WBC: 8.9 10*3/uL (ref 3.8–10.8)

## 2022-09-26 LAB — LIPID PANEL W/REFLEX DIRECT LDL
Cholesterol: 201 mg/dL — ABNORMAL HIGH (ref <200)
HDL: 46 mg/dL — ABNORMAL LOW (ref >=50)
LDL Cholesterol (Calc): 128 mg/dL (calc) — ABNORMAL HIGH
Non-HDL Cholesterol (Calc): 155 mg/dL (calc) — ABNORMAL HIGH (ref <130)
Total CHOL/HDL Ratio: 4.4 (calc) (ref <5.0)
Triglycerides: 152 mg/dL — ABNORMAL HIGH (ref <150)

## 2022-09-27 NOTE — Progress Notes (Signed)
HI Latandra, your LDL cholesterol went up this year it has been better the last couple of years closer to 100 and this time it was 128's..  So just really encouraged her to continue work on healthy diet and regular exercise.  Your blood count metabolic panel look great.

## 2022-09-30 ENCOUNTER — Encounter: Payer: Self-pay | Admitting: Family Medicine

## 2022-09-30 ENCOUNTER — Ambulatory Visit (INDEPENDENT_AMBULATORY_CARE_PROVIDER_SITE_OTHER): Payer: BC Managed Care – PPO | Admitting: Family Medicine

## 2022-09-30 VITALS — BP 134/74 | HR 57 | Ht 66.0 in | Wt 215.0 lb

## 2022-09-30 DIAGNOSIS — Z0000 Encounter for general adult medical examination without abnormal findings: Secondary | ICD-10-CM | POA: Diagnosis not present

## 2022-09-30 DIAGNOSIS — Z7184 Encounter for health counseling related to travel: Secondary | ICD-10-CM | POA: Diagnosis not present

## 2022-09-30 DIAGNOSIS — Z1211 Encounter for screening for malignant neoplasm of colon: Secondary | ICD-10-CM | POA: Diagnosis not present

## 2022-09-30 DIAGNOSIS — Z1231 Encounter for screening mammogram for malignant neoplasm of breast: Secondary | ICD-10-CM

## 2022-09-30 MED ORDER — SCOPOLAMINE 1 MG/3DAYS TD PT72
1.0000 | MEDICATED_PATCH | TRANSDERMAL | 0 refills | Status: DC
Start: 2022-09-30 — End: 2023-12-30

## 2022-09-30 NOTE — Progress Notes (Addendum)
Complete physical exam  Patient: Briana Parks   DOB: 03-Feb-1966   57 y.o. Female  MRN: [id number]  Subjective:    Chief Complaint  Patient presents with   Annual Exam    Briana Parks is a 57 y.o. female who presents today for a complete physical exam. She reports consuming a general diet. The patient does not participate in regular exercise at present. She generally feels well.. She does not have additional problems to discuss today.  Though she is going on a cruise for about a week.  She will fly out to the [facility] and wanted to know what she could take for seasickness.   Most recent fall risk assessment:    09/30/2022    7:14 AM  Fall Risk   Falls in the past year? 0  Number falls in past yr: 0  Injury with Fall? 0  Risk for fall due to : No Fall Risks  Follow up Falls evaluation completed     Most recent depression screenings:    09/30/2022    7:14 AM 11/28/2020    3:52 PM  PHQ 2/9 Scores  PHQ - 2 Score 0 2  PHQ- 9 Score  5        Patient Care Team: Agapito Games, MD as PCP - General Jodi Geralds, MD as Consulting Physician (Orthopedic Surgery)   Outpatient Medications Prior to Visit  Medication Sig   AMBULATORY NON FORMULARY MEDICATION Medication Name: 7 cm water pressure CPAP, heated/humidified machine.   Dx : Severe OSA.   meloxicam (MOBIC) 15 MG tablet One tab PO every 24 hours with a meal for 2 weeks, then once every 24 hours prn pain.   Multiple Vitamin (MULTIVITAMIN WITH MINERALS) TABS tablet Take 1 tablet by mouth daily.   NORTREL 7/7/7 0.5/0.75/1-35 MG-MCG tablet TAKE 1 TABLET BY MOUTH EVERY DAY   propranolol (INDERAL) 20 MG tablet TAKE 1 TABLET BY MOUTH TWICE A DAY   [DISCONTINUED] amoxicillin-clavulanate (AUGMENTIN) 875-125 MG tablet Take 1 tablet by mouth 2 (two) times daily.   [DISCONTINUED] benzonatate (TESSALON) 100 MG capsule Take 1 capsule (100 mg total) by mouth 2 (two) times daily as needed for cough.   No  facility-administered medications prior to visit.    ROS        Objective:     BP 134/74   Pulse (!) 57   Ht 5\' 6"  (1.676 m)   Wt 215 lb (97.5 kg)   SpO2 98%   BMI 34.70 kg/m    Physical Exam Vitals and nursing note reviewed. Exam conducted with a chaperone present.  Constitutional:      Appearance: Normal appearance. She is well-developed.  HENT:     Head: Normocephalic and atraumatic.     Right Ear: Tympanic membrane, ear canal and external ear normal.     Left Ear: Tympanic membrane, ear canal and external ear normal.     Nose: Nose normal.     Mouth/Throat:     Pharynx: Oropharynx is clear.  Eyes:     Conjunctiva/sclera: Conjunctivae normal.     Pupils: Pupils are equal, round, and reactive to light.  Neck:     Thyroid: No thyromegaly.  Cardiovascular:     Rate and Rhythm: Normal rate and regular rhythm.     Heart sounds: Normal heart sounds.  Pulmonary:     Effort: Pulmonary effort is normal.     Breath sounds: Normal breath sounds. No wheezing.  Chest:  Breasts:  Right: Normal.     Left: Normal.  Abdominal:     General: Bowel sounds are normal.     Palpations: Abdomen is soft.  Musculoskeletal:     Cervical back: Neck supple.  Lymphadenopathy:     Cervical: No cervical adenopathy.     Upper Body:     Right upper body: No supraclavicular, axillary or pectoral adenopathy.     Left upper body: No supraclavicular, axillary or pectoral adenopathy.  Skin:    General: Skin is warm and dry.     Coloration: Skin is not pale.  Neurological:     Mental Status: She is alert and oriented to person, place, and time.  Psychiatric:        Behavior: Behavior normal.      No results found for any visits on 09/30/22. Last hemoglobin A1c No results found for: "HGBA1C"      Assessment & Plan:    Routine Health Maintenance and Physical Exam  Immunization History  Administered Date(s) Administered   MMR 12/12/2006   Tdap 10/04/2019   Zoster  Recombinant(Shingrix) 11/28/2020    Health Maintenance  Topic Date Due   MAMMOGRAM  11/01/2019   Colonoscopy  04/11/2023 (Originally 05/18/2012)   Hepatitis C Screening  04/11/2023 (Originally 05/18/1985)   COVID-19 Vaccine (1) 04/26/2024 (Originally 11/16/1965)   Zoster Vaccines- Shingrix (2 of 2) 07/09/2024 (Originally 01/23/2021)   INFLUENZA VACCINE  10/24/2022   PAP SMEAR-Modifier  10/03/2024   DTaP/Tdap/Td (2 - Td or Tdap) 10/03/2029   HIV Screening  Completed   Pneumococcal Vaccine 10-22 Years old  Aged Out   HPV VACCINES  Aged Out    Discussed health benefits of physical activity, and encouraged her to engage in regular exercise appropriate for her age and condition.  Problem List Items Addressed This Visit   None Visit Diagnoses     Wellness examination    -  Primary   Screening mammogram for breast cancer       Relevant Orders   MM 3D DIAGNOSTIC MAMMOGRAM BILATERAL BREAST   Travel advice encounter       Relevant Medications   scopolamine (TRANSDERM-SCOP) 1 MG/3DAYS   Screen for colon cancer       Relevant Orders   Cologuard      Blood device-discussed using scopolamine patch.  Can apply either the day before or at least 4 hours before the trip.  Also recommend that she take nondrowsy Dramamine and Benadryl with her it if needed in case she does not like the patches.  I did send an extra in case they accidentally come off of her skin.  Keep up a regular exercise program and make sure you are eating a healthy diet Try to eat 4 servings of dairy a day, or if you are lactose intolerant take a calcium with vitamin D daily.  Your vaccines are up to date.   He is still not gone for colon cancer screening we discussed that she is now 10 years past the start recommendation at 22.  She said she is willing to at least do a Cologuard and if at some point she is willing to do a scope please let us know.  Return in about 1 year (around 09/30/2023) for Wellness Exam and recheck  cholesterol .     Nani Gasser, MD

## 2022-10-08 ENCOUNTER — Other Ambulatory Visit (INDEPENDENT_AMBULATORY_CARE_PROVIDER_SITE_OTHER): Payer: BC Managed Care – PPO

## 2022-10-08 ENCOUNTER — Ambulatory Visit: Payer: BC Managed Care – PPO | Admitting: Sports Medicine

## 2022-10-08 DIAGNOSIS — M25571 Pain in right ankle and joints of right foot: Secondary | ICD-10-CM | POA: Diagnosis not present

## 2022-10-08 NOTE — Progress Notes (Signed)
    Procedures performed today:    Procedure: Real-time Ultrasound Guided injection of the right ankle joint Device: Samsung HS60  Verbal informed consent obtained.  Time-out conducted.  Noted no overlying erythema, induration, or other signs of local infection.  Skin prepped in a sterile fashion.  Local anesthesia: Topical Ethyl chloride.  With sterile technique and under real time ultrasound guidance: Minimally with joint noted, 1 cc Kenalog 40, 1 cc lidocaine, 1 cc bupivacaine injected easily Completed without difficulty  Advised to call if fevers/chills, erythema, induration, drainage, or persistent bleeding.  Images permanently stored and available for review in PACS.  Impression: Technically successful ultrasound guided injection.  Independent interpretation of notes and tests performed by another provider:   None.  Brief History, Exam, Impression, and Recommendations:    Acute right ankle pain This pleasant 57 year old female returns, I saw her back on 1 July, she was cleaning house and then started to have severe pain anterior to the medial malleolus, she had no overt trauma. She had some swelling on exam but good strength and good motion, we got x-rays that showed some osteoarthritic changes, we added a cam boot, meloxicam, home PT, she returns today with persistent pain, pain is localized directly at the medial malleolus and its articulation with the medial talus. She has a cruise coming up on the 19th of this month, we injected her ankle today, return to see me about 6 weeks, MRI if not better.    ____________________________________________ Ihor Austin. Benjamin Stain, M.D., ABFM., CAQSM., AME. Primary Care and Sports Medicine [facility]  Adjunct Professor of Family Medicine  [place] [employer]  Restaurant manager, fast food

## 2022-10-08 NOTE — Assessment & Plan Note (Signed)
This pleasant 57 year old female returns, I saw her back on 1 July, she was cleaning house and then started to have severe pain anterior to the medial malleolus, she had no overt trauma. She had some swelling on exam but good strength and good motion, we got x-rays that showed some osteoarthritic changes, we added a cam boot, meloxicam, home PT, she returns today with persistent pain, pain is localized directly at the medial malleolus and its articulation with the medial talus. She has a cruise coming up on the 19th of this month, we injected her ankle today, return to see me about 6 weeks, MRI if not better.

## 2022-10-15 LAB — COLOGUARD: COLOGUARD: NEGATIVE

## 2022-10-15 NOTE — Progress Notes (Signed)
Great news! Your Cologuard test is negative.  Recommend repeat colon cancer screening in 3 years.

## 2022-11-22 ENCOUNTER — Ambulatory Visit: Payer: BC Managed Care – PPO | Admitting: Sports Medicine

## 2022-11-22 ENCOUNTER — Encounter: Payer: Self-pay | Admitting: Sports Medicine

## 2022-11-22 DIAGNOSIS — M25571 Pain in right ankle and joints of right foot: Secondary | ICD-10-CM

## 2022-11-22 NOTE — Progress Notes (Signed)
    Procedures performed today:    None.  Independent interpretation of notes and tests performed by another provider:   None.  Brief History, Exam, Impression, and Recommendations:    Acute right ankle pain This pleasant 57 year old female returns, she just came back from her [place] cruise, she had a great time, x-rays did show some arthritic changes throughout her ankle and foot, at the last visit her pain was localized directly at the medial malleolus at its articulation with the medial talus, we did an ankle joint injection, she might have noted some improvement but still has significant discomfort behind the medial malleolus now. As we have addressed her ankle joint I am concerned we are missing a tendinopathy, proceeding with MRI, for insurance coverage purposes she has failed greater than 6 weeks of conservative treatment including injections, physical therapy.    ____________________________________________ Ihor Austin. Benjamin Stain, M.D., ABFM., CAQSM., AME. Primary Care and Sports Medicine [facility]  Adjunct Professor of Family Medicine  [place] of [employer]  Restaurant manager, fast food

## 2022-11-22 NOTE — Assessment & Plan Note (Signed)
This pleasant 57 year old female returns, she just came back from her [place] cruise, she had a great time, x-rays did show some arthritic changes throughout her ankle and foot, at the last visit her pain was localized directly at the medial malleolus at its articulation with the medial talus, we did an ankle joint injection, she might have noted some improvement but still has significant discomfort behind the medial malleolus now. As we have addressed her ankle joint I am concerned we are missing a tendinopathy, proceeding with MRI, for insurance coverage purposes she has failed greater than 6 weeks of conservative treatment including injections, physical therapy.

## 2022-11-26 ENCOUNTER — Ambulatory Visit (INDEPENDENT_AMBULATORY_CARE_PROVIDER_SITE_OTHER): Payer: BC Managed Care – PPO

## 2022-11-26 DIAGNOSIS — M25571 Pain in right ankle and joints of right foot: Secondary | ICD-10-CM | POA: Diagnosis not present

## 2022-11-30 ENCOUNTER — Other Ambulatory Visit: Payer: Self-pay | Admitting: Family Medicine

## 2022-11-30 DIAGNOSIS — R519 Headache, unspecified: Secondary | ICD-10-CM

## 2022-12-09 ENCOUNTER — Ambulatory Visit
Admission: RE | Admit: 2022-12-09 | Discharge: 2022-12-09 | Disposition: A | Discharge status: Home / Self Care | Payer: BC Managed Care – PPO | Source: Ambulatory Visit | Attending: Family Medicine | Admitting: Family Medicine

## 2022-12-09 DIAGNOSIS — Z1231 Encounter for screening mammogram for malignant neoplasm of breast: Secondary | ICD-10-CM

## 2022-12-10 NOTE — Progress Notes (Signed)
Please call patient. Normal mammogram.  Repeat in 1 year.  

## 2022-12-20 ENCOUNTER — Ambulatory Visit: Payer: BC Managed Care – PPO

## 2023-02-01 ENCOUNTER — Other Ambulatory Visit: Payer: Self-pay | Admitting: Sports Medicine

## 2023-02-01 DIAGNOSIS — M25571 Pain in right ankle and joints of right foot: Secondary | ICD-10-CM

## 2023-03-13 ENCOUNTER — Other Ambulatory Visit: Payer: Self-pay | Admitting: Family Medicine

## 2023-06-26 ENCOUNTER — Other Ambulatory Visit: Payer: Self-pay

## 2023-06-26 DIAGNOSIS — M25571 Pain in right ankle and joints of right foot: Secondary | ICD-10-CM

## 2023-06-26 MED ORDER — MELOXICAM 15 MG PO TABS: ORAL_TABLET | ORAL | 0 refills | Status: DC

## 2023-07-24 ENCOUNTER — Other Ambulatory Visit: Payer: Self-pay | Admitting: Family Medicine

## 2023-07-24 DIAGNOSIS — M25571 Pain in right ankle and joints of right foot: Secondary | ICD-10-CM

## 2023-08-24 ENCOUNTER — Other Ambulatory Visit: Payer: Self-pay | Admitting: Family Medicine

## 2023-08-29 ENCOUNTER — Other Ambulatory Visit: Payer: Self-pay | Admitting: Family Medicine

## 2023-08-29 DIAGNOSIS — M25571 Pain in right ankle and joints of right foot: Secondary | ICD-10-CM

## 2023-09-25 ENCOUNTER — Other Ambulatory Visit: Payer: Self-pay | Admitting: Family Medicine

## 2023-09-25 DIAGNOSIS — M25571 Pain in right ankle and joints of right foot: Secondary | ICD-10-CM

## 2023-10-25 ENCOUNTER — Other Ambulatory Visit: Payer: Self-pay | Admitting: Family Medicine

## 2023-10-25 DIAGNOSIS — M25571 Pain in right ankle and joints of right foot: Secondary | ICD-10-CM

## 2023-11-16 ENCOUNTER — Other Ambulatory Visit: Payer: Self-pay | Admitting: Family Medicine

## 2023-11-17 NOTE — Telephone Encounter (Signed)
 Please advised pt that she will need OV for medication refills, thanks.

## 2023-11-18 NOTE — Telephone Encounter (Signed)
 Spoke with patient she is now scheduled for October 7th for medication refills

## 2023-11-25 ENCOUNTER — Encounter: Payer: Self-pay | Admitting: Sports Medicine

## 2023-12-07 ENCOUNTER — Other Ambulatory Visit: Payer: Self-pay | Admitting: Family Medicine

## 2023-12-07 DIAGNOSIS — R519 Headache, unspecified: Secondary | ICD-10-CM

## 2023-12-30 ENCOUNTER — Encounter: Payer: Self-pay | Admitting: Family Medicine

## 2023-12-30 ENCOUNTER — Ambulatory Visit: Payer: Self-pay | Admitting: Family Medicine

## 2023-12-30 ENCOUNTER — Telehealth: Payer: Self-pay | Admitting: Family Medicine

## 2023-12-30 VITALS — BP 130/64 | HR 81 | Ht 66.0 in | Wt 215.1 lb

## 2023-12-30 DIAGNOSIS — G4733 Obstructive sleep apnea (adult) (pediatric): Secondary | ICD-10-CM

## 2023-12-30 DIAGNOSIS — Z13220 Encounter for screening for lipoid disorders: Secondary | ICD-10-CM

## 2023-12-30 DIAGNOSIS — M25571 Pain in right ankle and joints of right foot: Secondary | ICD-10-CM

## 2023-12-30 DIAGNOSIS — Z6835 Body mass index (BMI) 35.0-35.9, adult: Secondary | ICD-10-CM

## 2023-12-30 DIAGNOSIS — N951 Menopausal and female climacteric states: Secondary | ICD-10-CM | POA: Diagnosis not present

## 2023-12-30 DIAGNOSIS — Z1159 Encounter for screening for other viral diseases: Secondary | ICD-10-CM

## 2023-12-30 DIAGNOSIS — R519 Headache, unspecified: Secondary | ICD-10-CM

## 2023-12-30 DIAGNOSIS — Z23 Encounter for immunization: Secondary | ICD-10-CM

## 2023-12-30 MED ORDER — MELOXICAM 15 MG PO TABS: ORAL_TABLET | ORAL | 1 refills | Status: AC

## 2023-12-30 NOTE — Assessment & Plan Note (Signed)
 Hasn't been wearing her CPAP lately.

## 2023-12-30 NOTE — Assessment & Plan Note (Signed)
 Continue propranolol .  We had just refilled the medication for a year in September continue twice daily dosing I think she also has a little bit of a borderline blood pressure issue so it does seem to help with that as well.

## 2023-12-30 NOTE — Telephone Encounter (Signed)
 Call patient: I did verify that there is no contraindication with giving the pneumonia vaccine, the Prevnar 20 and egg allergy.  So encouraged her to schedule at her convenience.

## 2023-12-30 NOTE — Patient Instructions (Signed)
 Weight loss drugs include  Wegovy Zepbound Victoza Qsymia Contrave

## 2023-12-30 NOTE — Progress Notes (Signed)
 Established Patient Office Visit  Subjective  Patient ID: Briana Parks, female    DOB: [birth year]  Age: 58 y.o. MRN: [id number]  Chief Complaint  Patient presents with   Medical Management of Chronic Issues    HPI  Discussed the use of AI scribe software for clinical note transcription with the patient, who gave verbal consent to proceed.  History of Present Illness Briana Parks is a 58 year old female who presents with menstrual irregularities after discontinuing birth control.  Menstrual irregularities - Discontinued birth control prior to current symptoms - While on birth control, experienced two months of amenorrhea followed by one month of heavy menstrual bleeding with severe cramps and passage of quarter-sized blood clots - Last menstrual period ended December 21, 2023 - No resumption of birth control since last period  Vasomotor and menopausal symptoms - Experiences hot flashes, night sweats, and mood swings - Symptoms disrupt sleep - Concerned about perimenopausal transition - Family history of menopause at age 3 in sister  Gynecologic surgical history and family history - Mother underwent hysterectomy - Sister had significant menstrual issues requiring surgical intervention - Concerned about potential for similar gynecologic issues  Weight fluctuations and diet challenges - Experiences weight fluctuations - Interested in weight management options - Difficulty maintaining a stable diet due to busy schedule  Preventive health maintenance - Completed Cologuard test for colon cancer screening last year with normal results - Last blood work completed July 2024; requests updated laboratory evaluation  Vaccine reactions and immunization status - History of egg white allergy and previous adverse reaction to tetanus vaccine, causing caution regarding pneumonia vaccination - Completed one dose of shingles vaccine; due for second dose      ROS     Objective:     BP 130/64   Pulse 81   Ht 5' 6 (1.676 m)   Wt 215 lb 1.9 oz (97.6 kg)   SpO2 98%   BMI 34.72 kg/m    Physical Exam Vitals and nursing note reviewed.  Constitutional:      Appearance: Normal appearance.  HENT:     Head: Normocephalic and atraumatic.  Eyes:     Conjunctiva/sclera: Conjunctivae normal.  Cardiovascular:     Rate and Rhythm: Normal rate and regular rhythm.  Pulmonary:     Effort: Pulmonary effort is normal.     Breath sounds: Normal breath sounds.  Skin:    General: Skin is warm and dry.  Neurological:     Mental Status: She is alert.  Psychiatric:        Mood and Affect: Mood normal.      No results found for any visits on 12/30/23.    The 10-year ASCVD risk score (Arnett DK, et al., 2019) is: 2.7%    Assessment & Plan:   Problem List Items Addressed This Visit       Respiratory   Obstructive sleep apnea 7cm water  pressure CPAP - Primary   Hasn't been wearing her CPAP lately.        Relevant Orders   CBC with Differential   Lipid Panel With LDL/HDL Ratio   CMP14+EGFR     Other   Nonintractable episodic headache   Continue propranolol .  We had just refilled the medication for a year in September continue twice daily dosing I think she also has a little bit of a borderline blood pressure issue so it does seem to help with that as well.      Relevant Medications  meloxicam  (MOBIC ) 15 MG tablet   BMI 35.0-35.9,adult   Acute right ankle pain   Relevant Medications   meloxicam  (MOBIC ) 15 MG tablet   Other Visit Diagnoses       Need for hepatitis C screening test       Relevant Orders   Hepatitis C Antibody     Screening, lipid       Relevant Orders   Lipid Panel With LDL/HDL Ratio     Encounter for immunization       Relevant Orders   Varicella-zoster vaccine IM (Completed)     Perimenopausal           Assessment and Plan Assessment & Plan Perimenopausal symptoms Heavy menstrual bleeding, cramps, and hot  flashes post-birth control cessation. Transition to menopause expected in 3-6 months with irregular bleeding and symptoms. - Monitor menstrual cycle and symptoms for 3-6 months. - Consider hormone replacement therapy if symptoms worsen. - Educated on irregular bleeding and menopausal symptoms during transition.  Obesity Classified as obese. Discussed GLP-1 receptor agonists for weight loss. Side effects include nausea and constipation. Insurance coverage needs verification. - Verify insurance coverage for GLP-1 receptor agonists. - Provide list of approved weight loss medications for insurance verification. - Encourage portion control and healthy eating, focusing on protein.  General Health Maintenance Due for routine health maintenance. Egg white allergy may affect vaccine eligibility. Normal Cologuard test last year. - Order blood work: kidney, liver, cholesterol, hepatitis C screening. - Administer second shingles vaccine. - Verify egg white allergy compatibility with pneumonia vaccine. - Decline flu vaccine.    Return in about 1 year (around 12/29/2024).    Briana Byars, MD

## 2023-12-31 ENCOUNTER — Ambulatory Visit: Payer: Self-pay | Admitting: Family Medicine

## 2023-12-31 LAB — CMP14+EGFR
ALT: 34 IU/L — ABNORMAL HIGH (ref 0–32)
AST: 23 IU/L (ref 0–40)
Albumin: 4.5 g/dL (ref 3.8–4.9)
Alkaline Phosphatase: 99 IU/L (ref 49–135)
BUN/Creatinine Ratio: 29 — ABNORMAL HIGH (ref 9–23)
BUN: 15 mg/dL (ref 6–24)
Bilirubin Total: 0.3 mg/dL (ref 0.0–1.2)
CO2: 22 mmol/L (ref 20–29)
Calcium: 9.4 mg/dL (ref 8.7–10.2)
Chloride: 103 mmol/L (ref 96–106)
Creatinine, Ser: 0.52 mg/dL — ABNORMAL LOW (ref 0.57–1.00)
Globulin, Total: 2.5 g/dL (ref 1.5–4.5)
Glucose: 84 mg/dL (ref 70–99)
Potassium: 4 mmol/L (ref 3.5–5.2)
Sodium: 141 mmol/L (ref 134–144)
Total Protein: 7 g/dL (ref 6.0–8.5)
eGFR: 109 mL/min/1.73 (ref >59)

## 2023-12-31 LAB — CBC WITH DIFFERENTIAL/PLATELET
Basophils Absolute: 0.1 x10E3/uL (ref 0.0–0.2)
Basos: 1 %
EOS (ABSOLUTE): 0.2 x10E3/uL (ref 0.0–0.4)
Eos: 2 %
Hematocrit: 43.2 % (ref 34.0–46.6)
Hemoglobin: 13.9 g/dL (ref 11.1–15.9)
Immature Grans (Abs): 0 x10E3/uL (ref 0.0–0.1)
Immature Granulocytes: 0 %
Lymphocytes Absolute: 4.7 x10E3/uL — ABNORMAL HIGH (ref 0.7–3.1)
Lymphs: 45 %
MCH: 30.4 pg (ref 26.6–33.0)
MCHC: 32.2 g/dL (ref 31.5–35.7)
MCV: 95 fL (ref 79–97)
Monocytes Absolute: 0.6 x10E3/uL (ref 0.1–0.9)
Monocytes: 6 %
Neutrophils Absolute: 4.8 x10E3/uL (ref 1.4–7.0)
Neutrophils: 46 %
Platelets: 302 x10E3/uL (ref 150–450)
RBC: 4.57 x10E6/uL (ref 3.77–5.28)
RDW: 12.5 % (ref 11.7–15.4)
WBC: 10.4 x10E3/uL (ref 3.4–10.8)

## 2023-12-31 LAB — LIPID PANEL WITH LDL/HDL RATIO
Cholesterol, Total: 193 mg/dL (ref 100–199)
HDL: 42 mg/dL (ref >39)
LDL Chol Calc (NIH): 124 mg/dL — ABNORMAL HIGH (ref 0–99)
LDL/HDL Ratio: 3 ratio (ref 0.0–3.2)
Triglycerides: 149 mg/dL (ref 0–149)
VLDL Cholesterol Cal: 27 mg/dL (ref 5–40)

## 2023-12-31 LAB — HEPATITIS C ANTIBODY: Hep C Virus Ab: NONREACTIVE

## 2023-12-31 NOTE — Telephone Encounter (Signed)
 This task has been completed as requested. Patient notified of the update. She will call back to schedule a Nurse Visit for the vaccine. No other inquiries during the call. No further action needed.

## 2023-12-31 NOTE — Progress Notes (Signed)
 Hi Briana Parks, blood count looks good.  LDL cholesterol up just slightly but triglycerides look good this time continue to work on healthy diet and regular exercise.  Kidney function is stable ALT liver enzyme is up just slightly.  Similar to about 4 years ago so again just continue to work on healthy Mediterranean diet and shooting for exercise for 30 minutes 5 days a week.  Negative for hepatitis C.  Would like to get your pneumonia vaccine updated next time you are here.

## 2024-02-14 ENCOUNTER — Other Ambulatory Visit: Payer: Self-pay | Admitting: Family Medicine

## 2024-03-15 ENCOUNTER — Telehealth: Payer: Self-pay | Admitting: Family Medicine

## 2024-03-15 ENCOUNTER — Other Ambulatory Visit (HOSPITAL_COMMUNITY): Payer: Self-pay

## 2024-03-15 DIAGNOSIS — Z6835 Body mass index (BMI) 35.0-35.9, adult: Secondary | ICD-10-CM

## 2024-03-15 MED ORDER — PHENTERMINE-TOPIRAMATE ER 3.75-23 MG PO CP24: 1.0000 | ORAL_CAPSULE | Freq: Every morning | ORAL | 0 refills | Status: AC

## 2024-03-15 NOTE — Telephone Encounter (Signed)
 Called her insurnace and Qsymia  is covered with a PA .  Meds ordered this encounter  Medications   Phentermine -Topiramate  ER (QSYMIA ) 3.75-23 MG CP24    Sig: Take 1 capsule by mouth in the morning.    Dispense:  30 capsule    Refill:  0

## 2024-03-17 ENCOUNTER — Other Ambulatory Visit (HOSPITAL_COMMUNITY): Payer: Self-pay

## 2024-03-17 ENCOUNTER — Telehealth: Payer: Self-pay

## 2024-03-17 NOTE — Telephone Encounter (Signed)
 Pharmacy Patient Advocate Encounter  Received notification from [facility] that Prior Authorization for Phentermine -Topiramate  ER 3.75-23MG  er capsules has been APPROVED from 03/17/24 to 07/16/24. Ran test claim, Copay is $5. This test claim was processed through [facility]- copay amounts may vary at other pharmacies due to pharmacy/plan contracts, or as the patient moves through the different stages of their insurance plan.   PA #/Case ID/Reference #: [id number]

## 2024-03-17 NOTE — Telephone Encounter (Signed)
 Pharmacy Patient Advocate Encounter   Received notification from Onbase that prior authorization for Phentermine -Topiramate  ER 3.75-23MG  er capsules is required/requested.   Insurance verification completed.   The patient is insured through [facility].   Per test claim: PA required; PA submitted to above mentioned insurance via Latent Key/confirmation #/[id number] Status is pending

## 2024-12-29 ENCOUNTER — Encounter: Admitting: Family Medicine
